# Patient Record
Sex: Female | Born: 1950 | Race: White | Hispanic: No | Marital: Married | Smoking: Never smoker
Health system: Southern US, Community
[De-identification: ages and names within clinical notes are randomized; demographics above are authoritative.]

## PROBLEM LIST (undated history)

## (undated) DIAGNOSIS — M8580 Other specified disorders of bone density and structure, unspecified site: Secondary | ICD-10-CM

## (undated) DIAGNOSIS — E2839 Other primary ovarian failure: Secondary | ICD-10-CM

## (undated) DIAGNOSIS — M25469 Effusion, unspecified knee: Secondary | ICD-10-CM

## (undated) DIAGNOSIS — M1990 Unspecified osteoarthritis, unspecified site: Secondary | ICD-10-CM

## (undated) DIAGNOSIS — R931 Abnormal findings on diagnostic imaging of heart and coronary circulation: Secondary | ICD-10-CM

## (undated) DIAGNOSIS — I77810 Thoracic aortic ectasia: Secondary | ICD-10-CM

## (undated) DIAGNOSIS — F329 Major depressive disorder, single episode, unspecified: Secondary | ICD-10-CM

## (undated) DIAGNOSIS — F988 Other specified behavioral and emotional disorders with onset usually occurring in childhood and adolescence: Secondary | ICD-10-CM

## (undated) DIAGNOSIS — J9811 Atelectasis: Secondary | ICD-10-CM

## (undated) DIAGNOSIS — Z9289 Personal history of other medical treatment: Secondary | ICD-10-CM

## (undated) DIAGNOSIS — F32A Depression, unspecified: Secondary | ICD-10-CM

## (undated) DIAGNOSIS — I10 Essential (primary) hypertension: Secondary | ICD-10-CM

## (undated) DIAGNOSIS — E785 Hyperlipidemia, unspecified: Secondary | ICD-10-CM

## (undated) HISTORY — DX: Unspecified osteoarthritis, unspecified site: M19.90

## (undated) HISTORY — DX: Atelectasis: J98.11

## (undated) HISTORY — DX: Abnormal findings on diagnostic imaging of heart and coronary circulation: R93.1

## (undated) HISTORY — DX: Hyperlipidemia, unspecified: E78.5

## (undated) HISTORY — PX: OTHER SURGICAL HISTORY: SHX169

## (undated) HISTORY — DX: Essential (primary) hypertension: I10

## (undated) HISTORY — PX: JOINT REPLACEMENT: SHX530

## (undated) HISTORY — DX: Other primary ovarian failure: E28.39

## (undated) HISTORY — PX: WISDOM TOOTH EXTRACTION: SHX21

## (undated) HISTORY — PX: DILATION AND CURETTAGE OF UTERUS: SHX78

## (undated) HISTORY — DX: Other specified disorders of bone density and structure, unspecified site: M85.80

## (undated) HISTORY — DX: Other specified behavioral and emotional disorders with onset usually occurring in childhood and adolescence: F98.8

## (undated) HISTORY — DX: Thoracic aortic ectasia: I77.810

---

## 1998-11-20 ENCOUNTER — Other Ambulatory Visit: Admission: RE | Admit: 1998-11-20 | Discharge: 1998-11-20 | Payer: Self-pay | Admitting: Family Medicine

## 2000-05-20 ENCOUNTER — Other Ambulatory Visit: Admission: RE | Admit: 2000-05-20 | Discharge: 2000-05-20 | Payer: Self-pay | Admitting: Family Medicine

## 2001-07-30 ENCOUNTER — Other Ambulatory Visit: Admission: RE | Admit: 2001-07-30 | Discharge: 2001-07-30 | Payer: Self-pay | Admitting: Family Medicine

## 2002-07-20 ENCOUNTER — Other Ambulatory Visit: Admission: RE | Admit: 2002-07-20 | Discharge: 2002-07-20 | Payer: Self-pay | Admitting: Family Medicine

## 2003-08-01 ENCOUNTER — Other Ambulatory Visit: Admission: RE | Admit: 2003-08-01 | Discharge: 2003-08-01 | Payer: Self-pay | Admitting: Family Medicine

## 2004-07-23 ENCOUNTER — Other Ambulatory Visit: Admission: RE | Admit: 2004-07-23 | Discharge: 2004-07-23 | Payer: Self-pay | Admitting: Family Medicine

## 2004-12-23 ENCOUNTER — Encounter: Admission: RE | Admit: 2004-12-23 | Discharge: 2004-12-23 | Payer: Self-pay | Admitting: Emergency Medicine

## 2005-08-08 ENCOUNTER — Other Ambulatory Visit: Admission: RE | Admit: 2005-08-08 | Discharge: 2005-08-08 | Payer: Self-pay | Admitting: Family Medicine

## 2006-10-27 ENCOUNTER — Other Ambulatory Visit: Admission: RE | Admit: 2006-10-27 | Discharge: 2006-10-27 | Payer: Self-pay | Admitting: Family Medicine

## 2007-09-09 HISTORY — PX: KNEE ARTHROSCOPY: SUR90

## 2007-12-23 ENCOUNTER — Encounter: Admission: RE | Admit: 2007-12-23 | Discharge: 2007-12-23 | Payer: Self-pay | Admitting: Family Medicine

## 2008-07-25 ENCOUNTER — Other Ambulatory Visit: Admission: RE | Admit: 2008-07-25 | Discharge: 2008-07-25 | Payer: Self-pay | Admitting: Family Medicine

## 2009-09-14 ENCOUNTER — Other Ambulatory Visit: Admission: RE | Admit: 2009-09-14 | Discharge: 2009-09-14 | Payer: Self-pay | Admitting: Family Medicine

## 2010-01-10 ENCOUNTER — Encounter: Admission: RE | Admit: 2010-01-10 | Discharge: 2010-01-10 | Payer: Self-pay | Admitting: Family Medicine

## 2014-05-26 DIAGNOSIS — R030 Elevated blood-pressure reading, without diagnosis of hypertension: Secondary | ICD-10-CM | POA: Diagnosis not present

## 2014-05-26 DIAGNOSIS — F3289 Other specified depressive episodes: Secondary | ICD-10-CM | POA: Diagnosis not present

## 2014-05-26 DIAGNOSIS — F988 Other specified behavioral and emotional disorders with onset usually occurring in childhood and adolescence: Secondary | ICD-10-CM | POA: Diagnosis not present

## 2014-05-26 DIAGNOSIS — Z23 Encounter for immunization: Secondary | ICD-10-CM | POA: Diagnosis not present

## 2014-05-26 DIAGNOSIS — M1990 Unspecified osteoarthritis, unspecified site: Secondary | ICD-10-CM | POA: Diagnosis not present

## 2014-05-26 DIAGNOSIS — F329 Major depressive disorder, single episode, unspecified: Secondary | ICD-10-CM | POA: Diagnosis not present

## 2014-05-26 DIAGNOSIS — E785 Hyperlipidemia, unspecified: Secondary | ICD-10-CM | POA: Diagnosis not present

## 2014-06-06 DIAGNOSIS — M25569 Pain in unspecified knee: Secondary | ICD-10-CM | POA: Diagnosis not present

## 2014-06-06 DIAGNOSIS — M1710 Unilateral primary osteoarthritis, unspecified knee: Secondary | ICD-10-CM | POA: Diagnosis not present

## 2014-06-27 ENCOUNTER — Other Ambulatory Visit (HOSPITAL_COMMUNITY): Payer: Self-pay | Admitting: Orthopaedic Surgery

## 2014-07-21 DIAGNOSIS — R399 Unspecified symptoms and signs involving the genitourinary system: Secondary | ICD-10-CM | POA: Diagnosis not present

## 2014-07-21 DIAGNOSIS — N390 Urinary tract infection, site not specified: Secondary | ICD-10-CM | POA: Diagnosis not present

## 2014-08-17 ENCOUNTER — Encounter (HOSPITAL_COMMUNITY)
Admission: RE | Admit: 2014-08-17 | Discharge: 2014-08-17 | Disposition: A | Discharge status: Home / Self Care | Payer: Medicare Other | Source: Ambulatory Visit | Attending: Orthopaedic Surgery | Admitting: Orthopaedic Surgery

## 2014-08-17 ENCOUNTER — Encounter (HOSPITAL_COMMUNITY): Payer: Self-pay

## 2014-08-17 DIAGNOSIS — M170 Bilateral primary osteoarthritis of knee: Secondary | ICD-10-CM | POA: Diagnosis not present

## 2014-08-17 DIAGNOSIS — Z01812 Encounter for preprocedural laboratory examination: Secondary | ICD-10-CM | POA: Insufficient documentation

## 2014-08-17 DIAGNOSIS — Z7901 Long term (current) use of anticoagulants: Secondary | ICD-10-CM | POA: Insufficient documentation

## 2014-08-17 HISTORY — DX: Major depressive disorder, single episode, unspecified: F32.9

## 2014-08-17 HISTORY — DX: Unspecified osteoarthritis, unspecified site: M19.90

## 2014-08-17 HISTORY — DX: Depression, unspecified: F32.A

## 2014-08-17 LAB — BASIC METABOLIC PANEL
Anion gap: 12 (ref 5–15)
BUN: 13 mg/dL (ref 6–23)
CO2: 32 mEq/L (ref 19–32)
Calcium: 10.1 mg/dL (ref 8.4–10.5)
Chloride: 96 mEq/L (ref 96–112)
Creatinine, Ser: 0.53 mg/dL (ref 0.50–1.10)
GFR calc Af Amer: >90 mL/min (ref >90)
GLUCOSE: 79 mg/dL (ref 70–99)
POTASSIUM: 3.7 meq/L (ref 3.7–5.3)
SODIUM: 140 meq/L (ref 137–147)

## 2014-08-17 LAB — CBC
HCT: 40 % (ref 36.0–46.0)
Hemoglobin: 13.1 g/dL (ref 12.0–15.0)
MCH: 29.9 pg (ref 26.0–34.0)
MCHC: 32.8 g/dL (ref 30.0–36.0)
MCV: 91.3 fL (ref 78.0–100.0)
PLATELETS: 276 10*3/uL (ref 150–400)
RBC: 4.38 MIL/uL (ref 3.87–5.11)
RDW: 13.7 % (ref 11.5–15.5)
WBC: 4.2 10*3/uL (ref 4.0–10.5)

## 2014-08-17 LAB — URINALYSIS, ROUTINE W REFLEX MICROSCOPIC
Glucose, UA: NEGATIVE mg/dL
Hgb urine dipstick: NEGATIVE
Ketones, ur: NEGATIVE mg/dL
LEUKOCYTES UA: NEGATIVE
NITRITE: NEGATIVE
PH: 8 (ref 5.0–8.0)
Protein, ur: NEGATIVE mg/dL
SPECIFIC GRAVITY, URINE: 1.011 (ref 1.005–1.030)
Urobilinogen, UA: 0.2 mg/dL (ref 0.0–1.0)

## 2014-08-17 LAB — PROTIME-INR
INR: 0.95 (ref 0.00–1.49)
PROTHROMBIN TIME: 12.8 s (ref 11.6–15.2)

## 2014-08-17 LAB — APTT: aPTT: 29 seconds (ref 24–37)

## 2014-08-17 LAB — SURGICAL PCR SCREEN
MRSA, PCR: NEGATIVE
STAPHYLOCOCCUS AUREUS: NEGATIVE

## 2014-08-17 NOTE — Patient Instructions (Addendum)
Becky JerichoRobin M Cunningham  08/17/2014   Your procedure is scheduled on: 08/25/2014    Come thru [facility] entrance.   and follow signs to               Short Stay Center at 0755 AM.  Call this number if you have problems the morning of surgery [phone]   Remember:  Do not eat food or drink liquids :After Midnight.     Take these medicines the morning of surgery with A SIP OF WATER: none                                You may not have any metal on your body including hair pins and              piercings  Do not wear jewelry, make-up, lotions, powders or perfumes.             Do not wear nail polish.  Do not shave  48 hours prior to surgery.     Do not bring valuables to the hospital. [facility] IS NOT             RESPONSIBLE   FOR VALUABLES.  Contacts, dentures or bridgework may not be worn into surgery.  Leave suitcase in the car. After surgery it may be brought to your room.                     Please read over the following fact sheets you were given: _____________________________________________________________________             [facility] Preparing [facility] Before surgery, you can play an important role.  Because skin is not sterile, your skin needs to be as free of germs as possible.  You can reduce the number of germs on your skin by washing with CHG (chlorahexidine gluconate) soap before surgery.  CHG is an antiseptic cleaner which kills germs and bonds with the skin to continue killing germs even after washing. Please DO NOT use if you have an allergy to CHG or antibacterial soaps.  If your skin becomes reddened/irritated stop using the CHG and inform your nurse when you arrive at Short Stay. Do not shave (including legs and underarms) for at least 48 hours prior to the first CHG shower.  You may shave your face/neck. Please follow these instructions carefully:  1.  Shower with CHG Soap the night before surgery and the  morning of Surgery.  2.   If you choose to wash your hair, wash your hair first as usual with your  normal  shampoo.  3.  After you shampoo, rinse your hair and body thoroughly to remove the  shampoo.                           4.  Use CHG as you would any other liquid soap.  You can apply chg directly  to the skin and wash                       Gently with a scrungie or clean washcloth.  5.  Apply the CHG Soap to your body ONLY FROM THE NECK DOWN.   Do not use on face/ open  Wound or open sores. Avoid contact with eyes, ears mouth and genitals (private parts).                       Wash face,  Genitals (private parts) with your normal soap.             6.  Wash thoroughly, paying special attention to the area where your surgery  will be performed.  7.  Thoroughly rinse your body with warm water from the neck down.  8.  DO NOT shower/wash with your normal soap after using and rinsing off  the CHG Soap.                9.  Pat yourself dry with a clean towel.            10.  Wear clean pajamas.            11.  Place clean sheets on your bed the night of your first shower and do not  sleep with pets. Day of Surgery : Do not apply any lotions/deodorants the morning of surgery.  Please wear clean clothes to the hospital/surgery center.  FAILURE TO FOLLOW THESE INSTRUCTIONS MAY RESULT IN THE CANCELLATION OF YOUR SURGERY PATIENT SIGNATURE_________________________________  NURSE SIGNATURE__________________________________  ________________________________________________________________________  WHAT IS A BLOOD TRANSFUSION? Blood Transfusion Information  A transfusion is the replacement of blood or some of its parts. Blood is made up of multiple cells which provide different functions.  Red blood cells carry oxygen and are used for blood loss replacement.  White blood cells fight against infection.  Platelets control bleeding.  Plasma helps clot blood.  Other blood products are available for  specialized needs, such as hemophilia or other clotting disorders. BEFORE THE TRANSFUSION  Who gives blood for transfusions?   Healthy volunteers who are fully evaluated to make sure their blood is safe. This is blood bank blood. Transfusion therapy is the safest it has ever been in the practice of medicine. Before blood is taken from a donor, a complete history is taken to make sure that person has no history of diseases nor engages in risky social behavior (examples are intravenous drug use or sexual activity with multiple partners). The donor's travel history is screened to minimize risk of transmitting infections, such as malaria. The donated blood is tested for signs of infectious diseases, such as HIV and hepatitis. The blood is then tested to be sure it is compatible with you in order to minimize the chance of a transfusion reaction. If you or a relative donates blood, this is often done in anticipation of surgery and is not appropriate for emergency situations. It takes many days to process the donated blood. RISKS AND COMPLICATIONS Although transfusion therapy is very safe and saves many lives, the main dangers of transfusion include:   Getting an infectious disease.  Developing a transfusion reaction. This is an allergic reaction to something in the blood you were given. Every precaution is taken to prevent this. The decision to have a blood transfusion has been considered carefully by your caregiver before blood is given. Blood is not given unless the benefits outweigh the risks. AFTER THE TRANSFUSION  Right after receiving a blood transfusion, you will usually feel much better and more energetic. This is especially true if your red blood cells have gotten low (anemic). The transfusion raises the level of the red blood cells which carry oxygen, and this usually causes an energy increase.  The  nurse administering the transfusion will monitor you carefully for complications. HOME CARE  INSTRUCTIONS  No special instructions are needed after a transfusion. You may find your energy is better. Speak with your caregiver about any limitations on activity for underlying diseases you may have. SEEK MEDICAL CARE IF:   Your condition is not improving after your transfusion.  You develop redness or irritation at the intravenous (IV) site. SEEK IMMEDIATE MEDICAL CARE IF:  Any of the following symptoms occur over the next 12 hours:  Shaking chills.  You have a temperature by mouth above 102 F (38.9 C), not controlled by medicine.  Chest, back, or muscle pain.  People around you feel you are not acting correctly or are confused.  Shortness of breath or difficulty breathing.  Dizziness and fainting.  You get a rash or develop hives.  You have a decrease in urine output.  Your urine turns a dark color or changes to pink, red, or brown. Any of the following symptoms occur over the next 10 days:  You have a temperature by mouth above 102 F (38.9 C), not controlled by medicine.  Shortness of breath.  Weakness after normal activity.  The white part of the eye turns yellow (jaundice).  You have a decrease in the amount of urine or are urinating less often.  Your urine turns a dark color or changes to pink, red, or brown. Document Released: 08/22/2000 Document Revised: 11/17/2011 Document Reviewed: 04/10/2008 [facility] Patient Information 2014 [place], [place].  _______________________________________________________________________

## 2014-08-25 ENCOUNTER — Inpatient Hospital Stay (HOSPITAL_COMMUNITY): Payer: Medicare Other | Admitting: Anesthesiology

## 2014-08-25 ENCOUNTER — Encounter (HOSPITAL_COMMUNITY)
Admission: RE | Disposition: A | Discharge status: Skilled Nursing Facility | Payer: Self-pay | Source: Ambulatory Visit | Attending: Orthopaedic Surgery

## 2014-08-25 ENCOUNTER — Inpatient Hospital Stay (HOSPITAL_COMMUNITY): Payer: Medicare Other

## 2014-08-25 ENCOUNTER — Inpatient Hospital Stay (HOSPITAL_COMMUNITY)
Admission: RE | Admit: 2014-08-25 | Discharge: 2014-08-29 | DRG: 462 | Disposition: A | Discharge status: Skilled Nursing Facility | Payer: Medicare Other | Source: Ambulatory Visit | Attending: Orthopaedic Surgery | Admitting: Orthopaedic Surgery

## 2014-08-25 ENCOUNTER — Encounter (HOSPITAL_COMMUNITY): Payer: Self-pay | Admitting: *Deleted

## 2014-08-25 DIAGNOSIS — M170 Bilateral primary osteoarthritis of knee: Secondary | ICD-10-CM | POA: Diagnosis not present

## 2014-08-25 DIAGNOSIS — G8918 Other acute postprocedural pain: Secondary | ICD-10-CM | POA: Diagnosis not present

## 2014-08-25 DIAGNOSIS — M25561 Pain in right knee: Secondary | ICD-10-CM | POA: Diagnosis not present

## 2014-08-25 DIAGNOSIS — M25462 Effusion, left knee: Secondary | ICD-10-CM | POA: Diagnosis present

## 2014-08-25 DIAGNOSIS — Z96653 Presence of artificial knee joint, bilateral: Secondary | ICD-10-CM

## 2014-08-25 DIAGNOSIS — M21061 Valgus deformity, not elsewhere classified, right knee: Secondary | ICD-10-CM | POA: Diagnosis present

## 2014-08-25 DIAGNOSIS — F339 Major depressive disorder, recurrent, unspecified: Secondary | ICD-10-CM | POA: Diagnosis not present

## 2014-08-25 DIAGNOSIS — Z471 Aftercare following joint replacement surgery: Secondary | ICD-10-CM | POA: Diagnosis not present

## 2014-08-25 DIAGNOSIS — Z91048 Other nonmedicinal substance allergy status: Secondary | ICD-10-CM

## 2014-08-25 DIAGNOSIS — F329 Major depressive disorder, single episode, unspecified: Secondary | ICD-10-CM | POA: Diagnosis present

## 2014-08-25 DIAGNOSIS — M179 Osteoarthritis of knee, unspecified: Secondary | ICD-10-CM | POA: Diagnosis not present

## 2014-08-25 DIAGNOSIS — D62 Acute posthemorrhagic anemia: Secondary | ICD-10-CM | POA: Diagnosis not present

## 2014-08-25 DIAGNOSIS — M25569 Pain in unspecified knee: Secondary | ICD-10-CM | POA: Diagnosis not present

## 2014-08-25 DIAGNOSIS — M25461 Effusion, right knee: Secondary | ICD-10-CM | POA: Diagnosis present

## 2014-08-25 DIAGNOSIS — M1990 Unspecified osteoarthritis, unspecified site: Secondary | ICD-10-CM | POA: Diagnosis not present

## 2014-08-25 DIAGNOSIS — M21062 Valgus deformity, not elsewhere classified, left knee: Secondary | ICD-10-CM | POA: Diagnosis present

## 2014-08-25 DIAGNOSIS — Z96651 Presence of right artificial knee joint: Secondary | ICD-10-CM | POA: Diagnosis not present

## 2014-08-25 DIAGNOSIS — Z96652 Presence of left artificial knee joint: Secondary | ICD-10-CM | POA: Diagnosis not present

## 2014-08-25 HISTORY — PX: TOTAL KNEE ARTHROPLASTY: SHX125

## 2014-08-25 LAB — TYPE AND SCREEN
ABO/RH(D): O POS
Antibody Screen: NEGATIVE

## 2014-08-25 LAB — ABO/RH: ABO/RH(D): O POS

## 2014-08-25 SURGERY — ARTHROPLASTY, KNEE, BILATERAL, TOTAL
Anesthesia: Epidural | Site: Knee | Laterality: Bilateral

## 2014-08-25 MED ORDER — METOCLOPRAMIDE HCL 5 MG/ML IJ SOLN
5.0000 mg | Freq: Three times a day (TID) | INTRAMUSCULAR | Status: DC | PRN
  Administered 2014-08-25: 10 mg via INTRAVENOUS

## 2014-08-25 MED ORDER — LACTATED RINGERS IV SOLN
INTRAVENOUS | Status: DC
  Administered 2014-08-25: 1000 mL via INTRAVENOUS

## 2014-08-25 MED ORDER — FENTANYL CITRATE 0.05 MG/ML IJ SOLN: 25.0000 ug | INTRAMUSCULAR | Status: DC | PRN

## 2014-08-25 MED ORDER — MENTHOL 3 MG MT LOZG
1.0000 | LOZENGE | OROMUCOSAL | Status: DC | PRN
  Filled 2014-08-25: qty 9

## 2014-08-25 MED ORDER — ACETAMINOPHEN 325 MG PO TABS
650.0000 mg | ORAL_TABLET | Freq: Four times a day (QID) | ORAL | Status: DC | PRN
  Administered 2014-08-27 – 2014-08-28 (×2): 650 mg via ORAL
  Filled 2014-08-25 (×2): qty 2

## 2014-08-25 MED ORDER — METHOCARBAMOL 1000 MG/10ML IJ SOLN
500.0000 mg | Freq: Four times a day (QID) | INTRAMUSCULAR | Status: DC | PRN
  Administered 2014-08-25: 500 mg via INTRAVENOUS
  Filled 2014-08-25 (×2): qty 5

## 2014-08-25 MED ORDER — 0.9 % SODIUM CHLORIDE (POUR BTL) OPTIME
TOPICAL | Status: DC | PRN
Start: 2014-08-25 — End: 2014-08-25
  Administered 2014-08-25: 1000 mL

## 2014-08-25 MED ORDER — PHENYLEPHRINE HCL 10 MG/ML IJ SOLN
10.0000 mg | INTRAVENOUS | Status: DC | PRN
  Administered 2014-08-25: 5 ug/min via INTRAVENOUS

## 2014-08-25 MED ORDER — METOCLOPRAMIDE HCL 10 MG PO TABS: 5.0000 mg | ORAL_TABLET | Freq: Three times a day (TID) | ORAL | Status: DC | PRN

## 2014-08-25 MED ORDER — POLYETHYLENE GLYCOL 3350 17 G PO PACK
17.0000 g | PACK | Freq: Every day | ORAL | Status: DC | PRN
  Administered 2014-08-27 – 2014-08-28 (×2): 17 g via ORAL
  Filled 2014-08-25 (×2): qty 1

## 2014-08-25 MED ORDER — ROPIVACAINE HCL 2 MG/ML IJ SOLN
12.0000 mL/h | INTRAMUSCULAR | Status: DC
  Administered 2014-08-25 – 2014-08-26 (×2): 12 mL/h via EPIDURAL
  Filled 2014-08-25 (×5): qty 200

## 2014-08-25 MED ORDER — VITAMIN C 500 MG PO TABS
1000.0000 mg | ORAL_TABLET | Freq: Every morning | ORAL | Status: DC
  Filled 2014-08-25: qty 2

## 2014-08-25 MED ORDER — AMPHETAMINE-DEXTROAMPHETAMINE 10 MG PO TABS
40.0000 mg | ORAL_TABLET | Freq: Every morning | ORAL | Status: DC
  Filled 2014-08-25 (×2): qty 4

## 2014-08-25 MED ORDER — ACETAMINOPHEN 650 MG RE SUPP: 650.0000 mg | Freq: Four times a day (QID) | RECTAL | Status: DC | PRN

## 2014-08-25 MED ORDER — PROPOFOL 10 MG/ML IV BOLUS
INTRAVENOUS | Status: AC
  Filled 2014-08-25: qty 20

## 2014-08-25 MED ORDER — ALUM & MAG HYDROXIDE-SIMETH 200-200-20 MG/5ML PO SUSP
30.0000 mL | ORAL | Status: DC | PRN
Start: 2014-08-25 — End: 2014-08-29

## 2014-08-25 MED ORDER — DIPHENHYDRAMINE HCL 12.5 MG/5ML PO ELIX: 12.5000 mg | ORAL_SOLUTION | ORAL | Status: DC | PRN

## 2014-08-25 MED ORDER — BUPIVACAINE HCL (PF) 0.5 % IJ SOLN
INTRAMUSCULAR | Status: DC | PRN
  Administered 2014-08-25 (×2): 5 mL
  Administered 2014-08-25: 12 mL via EPIDURAL
  Administered 2014-08-25 (×2): 5 mL

## 2014-08-25 MED ORDER — FENTANYL CITRATE 0.05 MG/ML IJ SOLN
INTRAMUSCULAR | Status: AC
  Filled 2014-08-25: qty 2

## 2014-08-25 MED ORDER — FENTANYL CITRATE 0.05 MG/ML IJ SOLN
50.0000 ug | INTRAMUSCULAR | Status: DC | PRN
  Administered 2014-08-25: 100 ug via INTRAVENOUS

## 2014-08-25 MED ORDER — CEFAZOLIN SODIUM-DEXTROSE 2-3 GM-% IV SOLR
INTRAVENOUS | Status: AC
  Filled 2014-08-25: qty 50

## 2014-08-25 MED ORDER — FLUOXETINE HCL 20 MG PO CAPS
60.0000 mg | ORAL_CAPSULE | Freq: Every day | ORAL | Status: DC
  Administered 2014-08-26 – 2014-08-29 (×4): 60 mg via ORAL
  Filled 2014-08-25 (×4): qty 3

## 2014-08-25 MED ORDER — LACTATED RINGERS IV SOLN
INTRAVENOUS | Status: DC | PRN
  Administered 2014-08-25 (×4): via INTRAVENOUS

## 2014-08-25 MED ORDER — PROPOFOL INFUSION 10 MG/ML OPTIME
INTRAVENOUS | Status: DC | PRN
  Administered 2014-08-25: 150 ug/kg/min via INTRAVENOUS

## 2014-08-25 MED ORDER — DEXAMETHASONE SODIUM PHOSPHATE 10 MG/ML IJ SOLN
INTRAMUSCULAR | Status: AC
  Filled 2014-08-25: qty 1

## 2014-08-25 MED ORDER — METHOCARBAMOL 500 MG PO TABS
500.0000 mg | ORAL_TABLET | Freq: Four times a day (QID) | ORAL | Status: DC | PRN
  Administered 2014-08-25 – 2014-08-29 (×7): 500 mg via ORAL
  Filled 2014-08-25 (×8): qty 1

## 2014-08-25 MED ORDER — TRANEXAMIC ACID 100 MG/ML IV SOLN
1000.0000 mg | INTRAVENOUS | Status: AC
  Administered 2014-08-25: 1000 mg via INTRAVENOUS
  Filled 2014-08-25: qty 10

## 2014-08-25 MED ORDER — ONDANSETRON HCL 4 MG/2ML IJ SOLN
4.0000 mg | Freq: Four times a day (QID) | INTRAMUSCULAR | Status: DC | PRN
  Administered 2014-08-25 – 2014-08-26 (×2): 4 mg via INTRAVENOUS
  Filled 2014-08-25 (×2): qty 2

## 2014-08-25 MED ORDER — MIDAZOLAM HCL 2 MG/2ML IJ SOLN
1.0000 mg | INTRAMUSCULAR | Status: DC | PRN
  Administered 2014-08-25: 2 mg via INTRAVENOUS
  Filled 2014-08-25: qty 2

## 2014-08-25 MED ORDER — BUPIVACAINE HCL (PF) 0.5 % IJ SOLN
INTRAMUSCULAR | Status: AC
  Filled 2014-08-25: qty 30

## 2014-08-25 MED ORDER — SODIUM CHLORIDE 0.9 % IV SOLN
INTRAVENOUS | Status: DC
  Administered 2014-08-27 – 2014-08-28 (×2): via INTRAVENOUS

## 2014-08-25 MED ORDER — PROMETHAZINE HCL 25 MG/ML IJ SOLN: 6.2500 mg | INTRAMUSCULAR | Status: DC | PRN

## 2014-08-25 MED ORDER — CEFAZOLIN SODIUM 1-5 GM-% IV SOLN
1.0000 g | Freq: Four times a day (QID) | INTRAVENOUS | Status: AC
  Administered 2014-08-25 (×2): 1 g via INTRAVENOUS
  Filled 2014-08-25 (×2): qty 50

## 2014-08-25 MED ORDER — ZOLPIDEM TARTRATE 5 MG PO TABS: 5.0000 mg | ORAL_TABLET | Freq: Every evening | ORAL | Status: DC | PRN

## 2014-08-25 MED ORDER — LACTATED RINGERS IV SOLN: INTRAVENOUS | Status: DC

## 2014-08-25 MED ORDER — MEPERIDINE HCL 50 MG/ML IJ SOLN: 6.2500 mg | INTRAMUSCULAR | Status: DC | PRN

## 2014-08-25 MED ORDER — PHENOL 1.4 % MT LIQD
1.0000 | OROMUCOSAL | Status: DC | PRN
  Filled 2014-08-25: qty 177

## 2014-08-25 MED ORDER — MIDAZOLAM HCL 2 MG/2ML IJ SOLN
INTRAMUSCULAR | Status: AC
  Filled 2014-08-25: qty 2

## 2014-08-25 MED ORDER — CEFAZOLIN SODIUM-DEXTROSE 2-3 GM-% IV SOLR
2.0000 g | INTRAVENOUS | Status: AC
  Administered 2014-08-25: 2 g via INTRAVENOUS

## 2014-08-25 MED ORDER — ONDANSETRON HCL 4 MG PO TABS
4.0000 mg | ORAL_TABLET | Freq: Four times a day (QID) | ORAL | Status: DC | PRN
  Administered 2014-08-26: 4 mg via ORAL
  Filled 2014-08-25: qty 1

## 2014-08-25 MED ORDER — HYDROMORPHONE HCL 1 MG/ML IJ SOLN
1.0000 mg | INTRAMUSCULAR | Status: DC | PRN
  Administered 2014-08-26 – 2014-08-27 (×12): 1 mg via INTRAVENOUS
  Filled 2014-08-25 (×14): qty 1

## 2014-08-25 MED ORDER — OXYCODONE HCL 5 MG PO TABS
5.0000 mg | ORAL_TABLET | ORAL | Status: DC | PRN
  Administered 2014-08-25: 5 mg via ORAL
  Filled 2014-08-25 (×2): qty 1

## 2014-08-25 MED ORDER — SODIUM CHLORIDE 0.9 % IR SOLN
Status: DC | PRN
  Administered 2014-08-25: 3000 mL

## 2014-08-25 MED ORDER — ACETAMINOPHEN 10 MG/ML IV SOLN
1000.0000 mg | Freq: Once | INTRAVENOUS | Status: AC
  Administered 2014-08-25: 1000 mg via INTRAVENOUS
  Filled 2014-08-25: qty 100

## 2014-08-25 MED ORDER — DOCUSATE SODIUM 100 MG PO CAPS
100.0000 mg | ORAL_CAPSULE | Freq: Two times a day (BID) | ORAL | Status: DC
  Administered 2014-08-25 – 2014-08-29 (×6): 100 mg via ORAL

## 2014-08-25 SURGICAL SUPPLY — 67 items
BAG ZIPLOCK 12X15 (MISCELLANEOUS) IMPLANT
BANDAGE ELASTIC 6 VELCRO ST LF (GAUZE/BANDAGES/DRESSINGS) ×4 IMPLANT
BANDAGE ESMARK 6X9 LF (GAUZE/BANDAGES/DRESSINGS) ×2 IMPLANT
BEARIN INSERT TIBIAL 11 (Orthopedic Implant) ×2 IMPLANT
BEARING INSERT TIBIAL 11 (Orthopedic Implant) ×1 IMPLANT
BENZOIN TINCTURE PRP APPL 2/3 (GAUZE/BANDAGES/DRESSINGS) ×4 IMPLANT
BLADE HEX COATED 2.75 (ELECTRODE) ×2 IMPLANT
BLADE SAG 18X100X1.27 (BLADE) ×4 IMPLANT
BLADE SURG SZ10 CARB STEEL (BLADE) ×4 IMPLANT
BNDG COHESIVE 6X5 TAN STRL LF (GAUZE/BANDAGES/DRESSINGS) ×2 IMPLANT
BNDG ESMARK 6X9 LF (GAUZE/BANDAGES/DRESSINGS) ×4
BOWL SMART MIX CTS (DISPOSABLE) ×4 IMPLANT
CAPT KNEE TOTAL 3 ×4 IMPLANT
CEMENT BONE 1-PACK (Cement) ×8 IMPLANT
CUFF TOURN SGL QUICK 34 (TOURNIQUET CUFF) ×2
CUFF TRNQT CYL 34X4X40X1 (TOURNIQUET CUFF) ×2 IMPLANT
DRAPE EXTREMITY BILATERAL (DRAPE) ×2 IMPLANT
DRAPE INCISE IOBAN 66X45 STRL (DRAPES) ×2 IMPLANT
DRAPE POUCH INSTRU U-SHP 10X18 (DRAPES) ×2 IMPLANT
DRAPE U-SHAPE 47X51 STRL (DRAPES) ×4 IMPLANT
DRSG ADAPTIC 3X8 NADH LF (GAUZE/BANDAGES/DRESSINGS) ×2 IMPLANT
DRSG AQUACEL AG ADV 3.5X10 (GAUZE/BANDAGES/DRESSINGS) IMPLANT
DRSG PAD ABDOMINAL 8X10 ST (GAUZE/BANDAGES/DRESSINGS) ×2 IMPLANT
DRSG TEGADERM 4X4.75 (GAUZE/BANDAGES/DRESSINGS) IMPLANT
DURAPREP 26ML APPLICATOR (WOUND CARE) ×4 IMPLANT
ELECT REM PT RETURN 9FT ADLT (ELECTROSURGICAL) ×2
ELECTRODE REM PT RTRN 9FT ADLT (ELECTROSURGICAL) ×1 IMPLANT
EVACUATOR 1/8 PVC DRAIN (DRAIN) IMPLANT
FACESHIELD WRAPAROUND (MASK) ×12 IMPLANT
FEMORAL TRIATH POST STAB  SZ3 (Orthopedic Implant) ×1 IMPLANT
FEMORAL TRIATH POST STAB SZ3 (Orthopedic Implant) ×1 IMPLANT
GAUZE SPONGE 2X2 8PLY STRL LF (GAUZE/BANDAGES/DRESSINGS) IMPLANT
GAUZE SPONGE 4X4 12PLY STRL (GAUZE/BANDAGES/DRESSINGS) ×4 IMPLANT
GAUZE XEROFORM 5X9 LF (GAUZE/BANDAGES/DRESSINGS) IMPLANT
GLOVE BIO SURGEON STRL SZ7.5 (GLOVE) ×4 IMPLANT
GLOVE BIOGEL PI IND STRL 8 (GLOVE) ×2 IMPLANT
GLOVE BIOGEL PI INDICATOR 8 (GLOVE) ×2
GLOVE ECLIPSE 8.0 STRL XLNG CF (GLOVE) ×4 IMPLANT
GOWN STRL REUS W/TWL XL LVL3 (GOWN DISPOSABLE) ×4 IMPLANT
HANDPIECE INTERPULSE COAX TIP (DISPOSABLE) ×1
IMMOBILIZER KNEE 20 (SOFTGOODS) ×4 IMPLANT
IMMOBILIZER KNEE 20 THIGH 36 (SOFTGOODS) ×1 IMPLANT
KIT BASIN OR (CUSTOM PROCEDURE TRAY) ×2 IMPLANT
MANIFOLD NEPTUNE II (INSTRUMENTS) ×2 IMPLANT
PACK TOTAL JOINT (CUSTOM PROCEDURE TRAY) ×2 IMPLANT
PAD ABD 8X10 STRL (GAUZE/BANDAGES/DRESSINGS) ×4 IMPLANT
PADDING CAST COTTON 6X4 STRL (CAST SUPPLIES) ×4 IMPLANT
SET HNDPC FAN SPRY TIP SCT (DISPOSABLE) ×1 IMPLANT
SET PAD KNEE POSITIONER (MISCELLANEOUS) ×4 IMPLANT
SPONGE GAUZE 2X2 STER 10/PKG (GAUZE/BANDAGES/DRESSINGS)
SPONGE LAP 18X18 X RAY DECT (DISPOSABLE) ×4 IMPLANT
STAPLER VISISTAT 35W (STAPLE) IMPLANT
STOCKINETTE 8 INCH (MISCELLANEOUS) ×2 IMPLANT
STRIP CLOSURE SKIN 1/2X4 (GAUZE/BANDAGES/DRESSINGS) ×4 IMPLANT
SUCTION FRAZIER 12FR DISP (SUCTIONS) ×2 IMPLANT
SUT MNCRL AB 4-0 PS2 18 (SUTURE) ×4 IMPLANT
SUT VIC AB 0 CT1 27 (SUTURE) ×4
SUT VIC AB 0 CT1 27XBRD ANTBC (SUTURE) ×4 IMPLANT
SUT VIC AB 1 CT1 27 (SUTURE) ×4
SUT VIC AB 1 CT1 27XBRD ANTBC (SUTURE) ×4 IMPLANT
SUT VIC AB 2-0 CT1 27 (SUTURE) ×6
SUT VIC AB 2-0 CT1 TAPERPNT 27 (SUTURE) ×6 IMPLANT
SUT VIC AB 4-0 PS2 27 (SUTURE) IMPLANT
TOWEL OR 17X26 10 PK STRL BLUE (TOWEL DISPOSABLE) ×2 IMPLANT
TRAY FOLEY CATH 14FRSI W/METER (CATHETERS) ×2 IMPLANT
WATER STERILE IRR 1500ML POUR (IV SOLUTION) ×2 IMPLANT
WRAP KNEE MAXI GEL POST OP (GAUZE/BANDAGES/DRESSINGS) ×4 IMPLANT

## 2014-08-25 NOTE — Brief Op Note (Signed)
08/25/2014  12:49 PM  PATIENT:  Becky Cunningham  63 y.o. female  PRE-OPERATIVE DIAGNOSIS:  Bilateral knee osteoarthritis  POST-OPERATIVE DIAGNOSIS:  Bilateral knee osteoarthritis  PROCEDURE:  Procedure(s): TOTAL KNEE BILATERAL (Bilateral)  SURGEON:  Surgeon(s) and Role:    * Kathryne Hitchhristopher Y Tomie Spizzirri, MD - Primary  PHYSICIAN ASSISTANT: Rexene EdisonGil Clark, PA-C  ANESTHESIA:   epidural and spinal  EBL:  Total I/O In: 2000 [I.V.:2000] Out: 1450 [Urine:1400; Blood:50]  BLOOD ADMINISTERED:none  DRAINS: none   LOCAL MEDICATIONS USED:  NONE  SPECIMEN:  No Specimen  DISPOSITION OF SPECIMEN:  N/A  COUNTS:  YES  TOURNIQUET:   Total Tourniquet Time Documented: Thigh (Left) - 60 minutes Total: Thigh (Left) - 60 minutes  Thigh (Right) - 47 minutes Total: Thigh (Right) - 47 minutes   DICTATION: .Other Dictation: Dictation Number [id number]  PLAN OF CARE: Admit to inpatient   PATIENT DISPOSITION:  PACU - hemodynamically stable.   Delay start of Pharmacological VTE agent (>24hrs) due to surgical blood loss or risk of bleeding: no

## 2014-08-25 NOTE — Progress Notes (Signed)
Utilization review completed.  

## 2014-08-25 NOTE — Anesthesia Preprocedure Evaluation (Addendum)
Anesthesia Evaluation  Patient identified by MRN, date of birth, ID band Patient awake    Reviewed: Allergy & Precautions, H&P , NPO status , Patient's Chart, lab work & pertinent test results  Airway Mallampati: II  TM Distance: >3 FB Neck ROM: Full    Dental no notable dental hx.    Pulmonary neg pulmonary ROS,  breath sounds clear to auscultation  Pulmonary exam normal       Cardiovascular negative cardio ROS  Rhythm:Regular Rate:Normal     Neuro/Psych negative neurological ROS  negative psych ROS   GI/Hepatic negative GI ROS, Neg liver ROS,   Endo/Other  negative endocrine ROS  Renal/GU negative Renal ROS  negative genitourinary   Musculoskeletal negative musculoskeletal ROS (+)   Abdominal   Peds negative pediatric ROS (+)  Hematology negative hematology ROS (+)   Anesthesia Other Findings   Reproductive/Obstetrics negative OB ROS                             Anesthesia Physical Anesthesia Plan  ASA: I  Anesthesia Plan: Epidural   Post-op Pain Management:    Induction: Intravenous  Airway Management Planned: Simple Face Mask  Additional Equipment:   Intra-op Plan:   Post-operative Plan:   Informed Consent: I have reviewed the patients History and Physical, chart, labs and discussed the procedure including the risks, benefits and alternatives for the proposed anesthesia with the patient or authorized representative who has indicated his/her understanding and acceptance.   Dental advisory given  Plan Discussed with: CRNA  Anesthesia Plan Comments:         Anesthesia Quick Evaluation

## 2014-08-25 NOTE — Anesthesia Procedure Notes (Signed)
Epidural Patient location during procedure: holding area  Staffing Anesthesiologist: Phillips GroutARIGNAN, Romolo Sieling Performed by: anesthesiologist   Preanesthetic Checklist Completed: patient identified, site marked, surgical consent, pre-op evaluation, timeout performed, IV checked, risks and benefits discussed, monitors and equipment checked and post-op pain management  Epidural Patient position: sitting Prep: Betadine Patient monitoring: heart rate, continuous pulse ox and blood pressure Approach: right paramedian Location: L4-L5 Injection technique: LOR saline  Needle:  Needle type: Hustead  Needle gauge: 18 G Needle length: 9 cm and 9 Needle insertion depth: 6 cm Catheter type: closed end flexible Catheter size: 20 Guage Catheter at skin depth: 10 cm Test dose: negative and 1.5% lidocaine  Assessment Events: blood not aspirated, injection not painful, no injection resistance, negative IV test and no paresthesia  Additional Notes Dosed with 12cc of 0.5% bupiv through the needle.  Patient tolerated the insertion well without complications.Reason for block:post-op pain management

## 2014-08-25 NOTE — Transfer of Care (Signed)
Immediate Anesthesia Transfer of Care Note  Patient: Becky JerichoRobin M Plate  Procedure(s) Performed: Procedure(s): TOTAL KNEE BILATERAL (Bilateral)  Patient Location: PACU  Anesthesia Type:Epidural  Level of Consciousness: awake, alert , oriented and patient cooperative  Airway & Oxygen Therapy: Patient Spontanous Breathing and Patient connected to face mask oxygen  Post-op Assessment: Report given to PACU RN and Post -op Vital signs reviewed and stable  Post vital signs: stable  Complications: No apparent anesthesia complications  S1 level epidural level

## 2014-08-25 NOTE — H&P (Signed)
TOTAL KNEE ADMISSION H&P  Patient is being admitted for bilaterally total knee arthroplasty.  Subjective:  Chief Complaint:bilaterally knee pain.  HPI: Becky Cunningham, 63 y.o. female, has a history of pain and functional disability in the bilaterally knee due to primary osteoarthritis and has failed non-surgical conservative treatments for greater than 12 weeks to includeNSAID's and/or analgesics, corticosteriod injections, viscosupplementation injections, flexibility and strengthening excercises and activity modification.  Onset of symptoms was gradual, starting 7 years ago with gradually worsening course since that time. The patient noted prior procedures on the knee to include  arthroscopy on the left knee(s).  Patient currently rates pain in the bilaterally knee(s) at 9 out of 10 with activity. Patient has night pain, worsening of pain with activity and weight bearing, pain that interferes with activities of daily living, pain with passive range of motion, crepitus and joint swelling.  Patient has evidence of subchondral sclerosis, periarticular osteophytes and joint space narrowing by imaging studies. There is no active infection.  Patient Active Problem List   Diagnosis Date Noted  . Primary osteoarthritis of both knees 08/25/2014   Past Medical History  Diagnosis Date  . Depression   . Arthritis     Past Surgical History  Procedure Laterality Date  . Dilation and curettage of uterus    . Knee arthroscopy  2009    left   . Wisdom tooth extraction    . Tmj similar surgery       No prescriptions prior to admission   Allergies  Allergen Reactions  . Other Other (See Comments)    Sugar enzymes--Throat swelling/scratchy. (when eats something really sweet)    History  Substance Use Topics  . Smoking status: Never Smoker   . Smokeless tobacco: Never Used  . Alcohol Use: 8.4 oz/week    14 Glasses of wine per week    No family history on file.   Review of Systems   Musculoskeletal: Positive for joint pain.  All other systems reviewed and are negative.   Objective:  Physical Exam  Constitutional: She is oriented to person, place, and time. She appears well-developed and well-nourished.  HENT:  Head: Normocephalic and atraumatic.  Eyes: EOM are normal. Pupils are equal, round, and reactive to light.  Neck: Normal range of motion. Neck supple.  Cardiovascular: Normal rate and regular rhythm.   Respiratory: Effort normal and breath sounds normal.  GI: Soft. Bowel sounds are normal.  Musculoskeletal:       Right knee: She exhibits decreased range of motion, swelling, effusion and deformity. Tenderness found. Lateral joint line tenderness noted.       Left knee: She exhibits decreased range of motion, swelling, effusion and abnormal alignment. Tenderness found. Lateral joint line tenderness noted.  Neurological: She is alert and oriented to person, place, and time.  Skin: Skin is warm and dry.  Psychiatric: She has a normal mood and affect.    Vital signs in last 24 hours:    Labs:   There is no height or weight on file to calculate BMI.   Imaging Review Plain radiographs demonstrate severe degenerative joint disease of the bilaterally knee(s). The overall alignment ismild valgus. The bone quality appears to be good for age and reported activity level.  Assessment/Plan:  End stage arthritis, bilaterally knee   The patient history, physical examination, clinical judgment of the provider and imaging studies are consistent with end stage degenerative joint disease of the bilaterally knee(s) and total knee arthroplasty is deemed medically necessary.  The treatment options including medical management, injection therapy arthroscopy and arthroplasty were discussed at length. The risks and benefits of total knee arthroplasty were presented and reviewed. The risks due to aseptic loosening, infection, stiffness, patella tracking problems,  thromboembolic complications and other imponderables were discussed. The patient acknowledged the explanation, agreed to proceed with the plan and consent was signed. Patient is being admitted for inpatient treatment for surgery, pain control, PT, OT, prophylactic antibiotics, VTE prophylaxis, progressive ambulation and ADL's and discharge planning. The patient is planning to be discharged home with home health services

## 2014-08-25 NOTE — Anesthesia Postprocedure Evaluation (Signed)
  Anesthesia Post-op Note  Patient: Becky JerichoRobin M Okubo  Procedure(s) Performed: Procedure(s) (LRB): TOTAL KNEE BILATERAL (Bilateral)  Patient Location: PACU  Anesthesia Type: Epidural  Level of Consciousness: awake and alert   Airway and Oxygen Therapy: Patient Spontanous Breathing  Post-op Pain: mild  Post-op Assessment: Post-op Vital signs reviewed, Patient's Cardiovascular Status Stable, Respiratory Function Stable, Patent Airway and No signs of Nausea or vomiting  Last Vitals:  Filed Vitals:   08/25/14 1415  BP: 138/83  Pulse: 56  Temp:   Resp: 10    Post-op Vital Signs: stable   Complications: No apparent anesthesia complications

## 2014-08-25 NOTE — Plan of Care (Signed)
Problem: Consults Goal: Diagnosis- Total Joint Replacement Primary Total Knee     

## 2014-08-26 LAB — BASIC METABOLIC PANEL
Anion gap: 11 (ref 5–15)
BUN: 8 mg/dL (ref 6–23)
CO2: 28 mEq/L (ref 19–32)
CREATININE: 0.54 mg/dL (ref 0.50–1.10)
Calcium: 8.7 mg/dL (ref 8.4–10.5)
Chloride: 102 mEq/L (ref 96–112)
Glucose, Bld: 118 mg/dL — ABNORMAL HIGH (ref 70–99)
Potassium: 3.6 mEq/L — ABNORMAL LOW (ref 3.7–5.3)
Sodium: 141 mEq/L (ref 137–147)

## 2014-08-26 LAB — CBC
HCT: 34 % — ABNORMAL LOW (ref 36.0–46.0)
Hemoglobin: 11.2 g/dL — ABNORMAL LOW (ref 12.0–15.0)
MCH: 30.4 pg (ref 26.0–34.0)
MCHC: 32.9 g/dL (ref 30.0–36.0)
MCV: 92.1 fL (ref 78.0–100.0)
PLATELETS: 217 10*3/uL (ref 150–400)
RBC: 3.69 MIL/uL — ABNORMAL LOW (ref 3.87–5.11)
RDW: 13.8 % (ref 11.5–15.5)
WBC: 6.2 10*3/uL (ref 4.0–10.5)

## 2014-08-26 MED ORDER — OXYCODONE HCL 5 MG PO TABS
5.0000 mg | ORAL_TABLET | ORAL | Status: DC | PRN
  Administered 2014-08-26 – 2014-08-27 (×2): 10 mg via ORAL
  Administered 2014-08-27 (×2): 15 mg via ORAL
  Administered 2014-08-27: 5 mg via ORAL
  Administered 2014-08-27 – 2014-08-28 (×7): 15 mg via ORAL
  Administered 2014-08-29: 10 mg via ORAL
  Administered 2014-08-29 (×3): 15 mg via ORAL
  Filled 2014-08-26 (×3): qty 3
  Filled 2014-08-26: qty 1
  Filled 2014-08-26 (×3): qty 3
  Filled 2014-08-26 (×2): qty 2
  Filled 2014-08-26 (×7): qty 3

## 2014-08-26 MED ORDER — ROPIVACAINE HCL 2 MG/ML IJ SOLN
12.0000 mL/h | INTRAMUSCULAR | Status: DC
  Administered 2014-08-26: 12 mL/h via EPIDURAL
  Filled 2014-08-26: qty 200

## 2014-08-26 MED ORDER — VITAMIN C 500 MG PO TABS
1000.0000 mg | ORAL_TABLET | Freq: Every morning | ORAL | Status: DC
  Administered 2014-08-29: 1000 mg via ORAL

## 2014-08-26 NOTE — Progress Notes (Signed)
OT Cancellation Note  Patient Details Name: Ricardo JerichoRobin M Staub MRN: [id number] DOB: Apr 21, 1951   Cancelled Treatment:    Reason Eval/Treat Not Completed: Other (comment) Spoke with PT and pt was orthostatic during PT eval. Only attempted EOB but had to lie pt back down. Will try pt back next date.  Lennox LaityStone, Marquisa Salih Stafford  [phone] 08/26/2014, 2:56 PM

## 2014-08-26 NOTE — Progress Notes (Signed)
Epidural Follow Up POD 1:   Examined and evaluated Becky Cunningham. She reports that her epidural was working well over night but she is slightly more numb on the left vs right. She did receive a PRN dilaudid dose early this am which improved her discomfort. She discussed using this again prior to PT. Denies any HA, dizzy, blurred vision. Motor function in bilateral lower extremities. Epidural site c/d/i w/ no erythema, edema, or drainage. Will reevaluate daily and please contact anesthesia when primary service would like epidural removed.

## 2014-08-26 NOTE — Addendum Note (Signed)
Addendum  created 08/26/14 0839 by Felipe DroneMary Jennette Parrie Rasco, MD   Modules edited: Clinical Notes   Clinical Notes:  File: [id number]

## 2014-08-26 NOTE — Plan of Care (Signed)
Problem: Consults Goal: Diagnosis- Total Joint Replacement Outcome: Completed/Met Date Met:  08/26/14 Primary Total Knee BILATERAL

## 2014-08-26 NOTE — Plan of Care (Signed)
Problem: Phase I Progression Outcomes Goal: Dangle or out of bed evening of surgery Outcome: Completed/Met Date Met:  08/26/14 Pod #1

## 2014-08-26 NOTE — Progress Notes (Signed)
Physical Therapy Treatment Patient Details Name: Ricardo JerichoRobin M Pesci MRN: [id number] DOB: August 12, 1951 Today's Date: 08/26/2014    History of Present Illness Bil TKR    PT Comments    Progression from am with decreased c/o dizziness with OOB.  Follow Up Recommendations  SNF     Equipment Recommendations  Rolling walker with 5" wheels    Recommendations for Other Services OT consult     Precautions / Restrictions Precautions Precautions: Knee;Fall Required Braces or Orthoses: Knee Immobilizer - Right;Knee Immobilizer - Left Knee Immobilizer - Right: Discontinue once straight leg raise with < 10 degree lag Knee Immobilizer - Left: Discontinue once straight leg raise with < 10 degree lag Restrictions Weight Bearing Restrictions: No Other Position/Activity Restrictions: WBAT    Mobility  Bed Mobility Overal bed mobility: +2 for physical assistance;+ 2 for safety/equipment;Needs Assistance Bed Mobility: Supine to Sit;Sit to Supine     Supine to sit: Mod assist;+2 for physical assistance;+2 for safety/equipment Sit to supine: Mod assist;+2 for physical assistance;+2 for safety/equipment   General bed mobility comments: cues for sequence, Assist for Bil LEs and to control trunk up/down  Transfers Overall transfer level: Needs assistance Equipment used: Rolling walker (2 wheeled) Transfers: Sit to/from Stand Sit to Stand: Mod assist;+2 physical assistance;+2 safety/equipment;From elevated surface         General transfer comment: cues for transition position and use of UEs to self assist  Ambulation/Gait Ambulation/Gait assistance: Mod assist;+2 physical assistance;+2 safety/equipment Ambulation Distance (Feet): 3 Feet Assistive device: Rolling walker (2 wheeled) Gait Pattern/deviations: Step-to pattern;Decreased step length - right;Decreased step length - left;Shuffle;Trunk flexed Gait velocity: decr       Stairs            Wheelchair Mobility    Modified  Rankin (Stroke Patients Only)       Balance                                    Cognition Arousal/Alertness: Awake/alert Behavior During Therapy: WFL for tasks assessed/performed Overall Cognitive Status: Within Functional Limits for tasks assessed                      Exercises      General Comments        Pertinent Vitals/Pain Pain Assessment: 0-10 Pain Score: 5  Pain Location: Bil knees Pain Descriptors / Indicators: Aching;Sore Pain Intervention(s): Limited activity within patient's tolerance;Monitored during session;Premedicated before session;Ice applied    Home Living                      Prior Function            PT Goals (current goals can now be found in the care plan section) Acute Rehab PT Goals Patient Stated Goal: Resume previous lifestyle with decreased pain PT Goal Formulation: With patient Time For Goal Achievement: 09/02/14 Potential to Achieve Goals: Good Progress towards PT goals: Progressing toward goals    Frequency  7X/week    PT Plan Current plan remains appropriate    Co-evaluation             End of Session Equipment Utilized During Treatment: Right knee immobilizer;Left knee immobilizer;Gait belt Activity Tolerance: Other (comment) (dizziness with OOB - decreased from this am) Patient left: in bed;with call bell/phone within reach     Time: 1440-1513 PT Time Calculation (min) (ACUTE ONLY): 33 min  Charges:  $Gait Training: 8-22 mins                    G Codes:      Angellee Cohill 08/26/2014, 5:06 PM

## 2014-08-26 NOTE — Evaluation (Signed)
Physical Therapy Evaluation Patient Details Name: Becky JerichoRobin M Cunningham MRN: [id number] DOB: [birth year] Today's Date: 08/26/2014   History of Present Illness  Bil TKR  Clinical Impression  Pt s/p Bil TKR presents with decreased Bil LE strength/ROM, post op pain and orthostatic hypotension on eval limiting functional mobility.  Pt plans d.c SNF for rehab and has expressed [facility] in [facility] and [facility].    Follow Up Recommendations SNF    Equipment Recommendations  Rolling walker with 5" wheels    Recommendations for Other Services OT consult     Precautions / Restrictions Precautions Precautions: Knee;Fall Required Braces or Orthoses: Knee Immobilizer - Right;Knee Immobilizer - Left Knee Immobilizer - Right: Discontinue once straight leg raise with < 10 degree lag Knee Immobilizer - Left: Discontinue once straight leg raise with < 10 degree lag Restrictions Weight Bearing Restrictions: No Other Position/Activity Restrictions: WBAT      Mobility  Bed Mobility Overal bed mobility: +2 for physical assistance;+ 2 for safety/equipment             General bed mobility comments: cues for sequence, Assist for Bil LEs and to control trunk up/down  Transfers                 General transfer comment: NT - pt orthostatic  Ambulation/Gait                Stairs            Wheelchair Mobility    Modified Rankin (Stroke Patients Only)       Balance                                             Pertinent Vitals/Pain Pain Assessment: 0-10 Pain Score: 5  Pain Location: R knee - min pain L knee Pain Descriptors / Indicators: Aching;Sore Pain Intervention(s): Limited activity within patient's tolerance;Monitored during session;Premedicated before session;Ice applied;Other (comment) (epidural in place)    Home Living Family/patient expects to be discharged to:: Skilled nursing facility                 Additional Comments: Pt  is interested in [facility] and [facility]    Prior Function Level of Independence: Independent               Hand Dominance        Extremity/Trunk Assessment   Upper Extremity Assessment: Overall WFL for tasks assessed           Lower Extremity Assessment: RLE deficits/detail;LLE deficits/detail RLE Deficits / Details: 2+/5 quads with AAROM at knee -10 - 40 LLE Deficits / Details: 2-/5 quads with AAROM at knee -10-  60  Cervical / Trunk Assessment: Normal  Communication   Communication: No difficulties  Cognition Arousal/Alertness: Awake/alert Behavior During Therapy: WFL for tasks assessed/performed Overall Cognitive Status: Within Functional Limits for tasks assessed                      General Comments      Exercises Total Joint Exercises Ankle Circles/Pumps: AROM;Both;10 reps;Supine Quad Sets: AROM;Both;10 reps;Supine Heel Slides: AAROM;Both;10 reps;Supine Hip ABduction/ADduction: AAROM;Both;10 reps;Supine Straight Leg Raises: AAROM;Both;10 reps;Supine      Assessment/Plan    PT Assessment Patient needs continued PT services  PT Diagnosis Difficulty walking   PT Problem List Decreased strength;Decreased range of motion;Decreased activity tolerance;Decreased mobility;Decreased knowledge  of use of DME;Pain  PT Treatment Interventions DME instruction;Gait training;Stair training;Functional mobility training;Therapeutic activities;Therapeutic exercise;Patient/family education   PT Goals (Current goals can be found in the Care Plan section) Acute Rehab PT Goals Patient Stated Goal: Resume previous lifestyle with decreased pain PT Goal Formulation: With patient Time For Goal Achievement: 09/02/14 Potential to Achieve Goals: Good    Frequency 7X/week   Barriers to discharge        Co-evaluation               End of Session Equipment Utilized During Treatment: Right knee immobilizer;Left knee immobilizer;Gait belt Activity  Tolerance: Other (comment) (orthostatic) Patient left: in bed;with call bell/phone within reach Nurse Communication: Mobility status         Time: [id number] PT Time Calculation (min) (ACUTE ONLY): 28 min   Charges:   PT Evaluation $Initial PT Evaluation Tier I: 1 Procedure PT Treatments $Therapeutic Exercise: 8-22 mins $Therapeutic Activity: 8-22 mins   PT G Codes:          Dareion Kneece 08/26/2014, 2:36 PM

## 2014-08-26 NOTE — Progress Notes (Signed)
Subjective: 1 Day Post-Op Procedure(s) (LRB): TOTAL KNEE BILATERAL (Bilateral) Patient reports pain as moderate.    Objective: Vital signs in last 24 hours: Temp:  [96.4 F (35.8 C)-98.4 F (36.9 C)] 98.4 F (36.9 C) (12/19 1000) Pulse Rate:  [48-71] 71 (12/19 1000) Resp:  [8-16] 13 (12/19 1000) BP: (84-142)/(48-85) 112/63 mmHg (12/19 1000) SpO2:  [98 %-100 %] 100 % (12/19 1000)  Intake/Output from previous day: 12/18 0701 - 12/19 0700 In: 7502.2 [P.O.:480; I.V.:6967.2; IV Piggyback:55] Out: 3650 [Urine:3400; Blood:250] Intake/Output this shift: Total I/O In: 0  Out: 200 [Urine:200]   Recent Labs  08/26/14 0525  HGB 11.2*    Recent Labs  08/26/14 0525  WBC 6.2  RBC 3.69*  HCT 34.0*  PLT 217    Recent Labs  08/26/14 0525  NA 141  K 3.6*  CL 102  CO2 28  BUN 8  CREATININE 0.54  GLUCOSE 118*  CALCIUM 8.7   No results for input(s): LABPT, INR in the last 72 hours.  Intact pulses distally Dorsiflexion/Plantar flexion intact Incision: dressing C/D/I Compartment soft  Assessment/Plan: 1 Day Post-Op Procedure(s) (LRB): TOTAL KNEE BILATERAL (Bilateral) Will have anesthesia discontinue the epidural tomorrow am  Kathryne HitchBLACKMAN,Becky Cunningham 08/26/2014, 1:23 PM

## 2014-08-26 NOTE — Op Note (Signed)
NAMEASLEE, SUCH NO.:  [id number]  MEDICAL RECORD NO.:  [id number]  LOCATION:  1610                         FACILITY:  [facility]  PHYSICIAN:  Vanita Panda. 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  PROCEDURE:  Bilateral total knee arthroplasties.  IMPLANTS:  Both knees with Stryker Triathlon implants, both knees with size 3 femur, size 3 tibial tray, 11-mm fix-bearing polyethylene insert, size 29 patellar button.  SURGEON:  Vanita Panda. Magnus Ivan, M.D.  ASSISTANT:  Richardean Canal, P.A.  ANESTHESIA:  Continuous spinal epidural.  BLOOD LOSS:  Less than 100 mL, bilateral.  ANTIBIOTICS:  2 g of IV Ancef.  TOURNIQUET TIME:  59 minutes on the left leg and 46 minutes on the right leg.  COMPLICATIONS:  None.  INDICATIONS:  Becky Cunningham is a 63 year old, well known to me.  I have been seeing her for many years now and had performed a left knee arthroscopy many years ago due to significant arthritic changes and meniscal tear of her left knee.  Both knees have well-documented valgus deformities.  She is a very thin individual and is very active.  Her knees swell significantly.  We have drained them on occasions, we have provided steroid injections and supplement injections.  She is a very active individual and now her pain is daily, her mobility has been affected as well as her quality of life due to her knee pain.  X-rays do show tricompartmental arthritic changes with a valgus deformity of both knees.  At this point, she wished to proceed with bilateral total knee arthroplasty.  I have talked to her about this extensively, about increased morbidity and mortality, as associated to the  bilateral knee replacements and she understands this fully and still as persistent that we proceed with both due to the pain in both of her knees.  She understands the risks of acute blood loss anemia, nerve and vessel injury, fracture, infection, and DVT.  She understands that these are quite heightened in more significant and bilaterally knees patients. Again with her understanding of these, she still wish to proceed with bilateral knee replacements.  Her left knee is worse than her right, so we would proceeding with the left knee first, then the right knee second.  PROCEDURE DESCRIPTION:  After informed consent was obtained, appropriate left and right knees were marked and spinal anesthesia was obtained. She was laid supine on the operating table, Foley catheter was placed as well.  Both legs had thigh tourniquets placed on them, non-sterilely and then were sterilely prepped from the thighs down the ankle with DuraPrep and sterile drapes on both legs and sterile stockinettes.  Both legs were again prepped and draped at the same time.  Time-out was called and she was identified as correct patient and correct left and right knees. We then started with the left knee first.  We wrapped out that knee with Esmarch and the tourniquet was inflated to 300  mm of pressure.  We made a midline incision over the knee and carried this down the knee joint and performed a medial parapatellar arthrotomy and found to have large joint effusion.  Once we opened up the knee, we cleaned the debris from the medial and lateral meniscus, we found significant loss of cartilage on the lateral aspect of her knee as well as the patellofemoral joint. We then with the knee in the flexed position used our extramedullary cutting guide for the tibia taking 9 mm off the high side and correcting her varus and valgus and her slope was neutral.  We then made this cut. We then used an intramedullary guide for the femur and  took distal femoral cut of 8 mm.  The distal femoral cutting block set at 5 degrees left, externally rotated.  Once we made this cut, we brought the knee back down in extension and extension was full and almost hyperextended with a 9-mm block and with an 11-mm block and it was full.  We then went back to the femur and put our femoral sizing guide based off the epicondylar axis and Whitesides line.  We chose a size 3 femur.  We put our size 3 femur 4-in-1 cutting block on and made our anterior and posterior cuts followed by our chamfer cuts.  We then made our femoral box cut.  Attention was then turned back to the tibia.  We trialed for a size 3 tibia, which I was pleased on the placement of this.  We cut our keel based off this.  We then with the trial components in place, we put the knee through a range of motion with an 11-mm polyethylene insert and I was pleased with the mobility.  We then drilled holes and made our patellar cut, taking 8 mm off the patella and sized first 29 patellar button.  With all trial components in place, I was pleased with the range of motion and stability and removed all trial components.  We then irrigated the knee with normal saline solution and cleaned debris further from the knee.  We mixed our cement and then cemented the real Stryker Triathlon tibial tray, size 3, the real Stryker Triathlon femur size 3.  We placed the real 9-mm fix-bearing polyethylene insert and cemented the patellar button.  Once the cement had hardened, we let the tourniquet down and hemostasis was obtained with electrocautery.  We then closed the arthrotomy with interrupted #1 Vicryl suture followed by 0 Vicryl in the deep tissue, 2-0 Vicryl in the subcutaneous tissue, 4-0 Monocryl subcuticular stitch and Steri-Strips on the skin.  While the last letters are being closed by the physician's assistant, we started on the right knee.  We used an Esmarch to wrap out that leg and  the tourniquet was inflated to 300 mm of pressure.  We then made a midline incision over the right knee, assessing her valgus deformity as well. We dissected down the knee joint and performed a medial parapatellar arthrotomy on the right knee.  We found a large joint effusion and the same wear on the lateral compartment and the patellofemoral joint that we found on her opposite knee.  We then with the knee in a flexed position, used our tibial guide and took 9 mm off the high side correcting for negative slope and neutral varus-valgus.  We used intramedullary guide on the femur and made our 8-mm distal femoral cut. We then put our 4-in-1 cutting block on after putting our  sizing guide based off the epicondylar axis, 5 degrees, externally rotated to the right.  We then made our 4-in-1 cut for a size 3 femur and our femoral box cut.  We went back to the tibia and same as the opposite side.  We used a size 3 tibia trial and made our keel punch for this.  We then cut our patella as well and with all trial components in place, I was pleased with the stability and the range of motion just like her left side.  We then removed all trial instrumentation on the right side, irrigated the knee with normal saline solution and cleaned the debris from the knee.  We make sure cement and then cemented the real Stryker Triathlon tibial tray, size 3, the real size 3 femur.  We placed the real 11-mm polyethylene insert and cemented the patellar button.  After the cement hardened, we let the tourniquet down on this side and hemostasis was obtained with electrocautery.  We then closed in the same fashion of the left side with #1 Vicryl in the arthrotomy, 0 Vicryl in the deep tissue, 2-0 Vicryl in the subcutaneous tissue, 4-0 Monocryl subcuticular stitch, and Steri-Strips on the skin. We then placed well-padded dressings on both knees.  She was taken to the recovery room in stable condition.  All final counts  were correct. There were no complications noted.     Vanita Pandahristopher Y. Magnus IvanBlackman, M.D.     CYB/MEDQ  D:  08/25/2014  T:  08/26/2014  Job:  [id number]

## 2014-08-27 LAB — CBC
HEMATOCRIT: 25.6 % — AB (ref 36.0–46.0)
HEMOGLOBIN: 8.7 g/dL — AB (ref 12.0–15.0)
MCH: 30.7 pg (ref 26.0–34.0)
MCHC: 34 g/dL (ref 30.0–36.0)
MCV: 90.5 fL (ref 78.0–100.0)
Platelets: 172 10*3/uL (ref 150–400)
RBC: 2.83 MIL/uL — ABNORMAL LOW (ref 3.87–5.11)
RDW: 13.6 % (ref 11.5–15.5)
WBC: 7.8 10*3/uL (ref 4.0–10.5)

## 2014-08-27 MED ORDER — RIVAROXABAN 10 MG PO TABS
10.0000 mg | ORAL_TABLET | Freq: Every day | ORAL | Status: DC
  Administered 2014-08-27 – 2014-08-28 (×2): 10 mg via ORAL
  Filled 2014-08-27 (×3): qty 1

## 2014-08-27 NOTE — Progress Notes (Signed)
Subjective: 2 Days Post-Op Procedure(s) (LRB): TOTAL KNEE BILATERAL (Bilateral) Patient reports pain as moderate to severe .  No chest pain or SOB.  Objective: Vital signs in last 24 hours: Temp:  [97.9 F (36.6 C)-99.5 F (37.5 C)] 98.6 F (37 C) (12/20 0545) Pulse Rate:  [71-83] 83 (12/20 0545) Resp:  [10-16] 15 (12/20 0545) BP: (103-145)/(56-76) 103/56 mmHg (12/20 0545) SpO2:  [100 %] 100 % (12/20 0545)  Intake/Output from previous day: 12/19 0701 - 12/20 0700 In: 880 [P.O.:480; I.V.:400] Out: 700 [Urine:700] Intake/Output this shift:     Recent Labs  08/26/14 0525 08/27/14 0530  HGB 11.2* 8.7*    Recent Labs  08/26/14 0525 08/27/14 0530  WBC 6.2 7.8  RBC 3.69* 2.83*  HCT 34.0* 25.6*  PLT 217 172    Recent Labs  08/26/14 0525  NA 141  K 3.6*  CL 102  CO2 28  BUN 8  CREATININE 0.54  GLUCOSE 118*  CALCIUM 8.7   No results for input(s): LABPT, INR in the last 72 hours.  Bilateral total knee arthroplasties Sensation intact distally Intact pulses distally Dorsiflexion/Plantar flexion intact Incision: scant drainage Compartment soft  Assessment/Plan: 2 Days Post-Op Procedure(s) (LRB): TOTAL KNEE BILATERAL (Bilateral)  Will start on Xarelto for DVT prophylaxis Up with therapy after epidural removed Post-op anemia secondary to surgery will monitor for symptoms of anemia  Becky Cunningham 08/27/2014, 8:29 AM

## 2014-08-27 NOTE — Progress Notes (Signed)
Physical Therapy Treatment Patient Details Name: Becky JerichoRobin M Mallis MRN: [id number] DOB: 10/31/1950 Today's Date: 08/27/2014    History of Present Illness Bil TKR    PT Comments    POD # 2 am session.  Assisted pt OOB + 2 assist with B KI's and elevated bed.  Amb very limited distance due to MAX c/o pain and high anxiety.  Also max c/o dizziness.  Recliner brought to pt. Positioned in recliner, removed b KI's and performed B TKR TE's to tolerance.  Followed by ICE.  Follow Up Recommendations  SNF     Equipment Recommendations       Recommendations for Other Services       Precautions / Restrictions Precautions Precautions: Knee;Fall Precaution Comments: Instructed pt on KI use for amb may remove in bed Required Braces or Orthoses: Knee Immobilizer - Right;Knee Immobilizer - Left Knee Immobilizer - Right: Discontinue once straight leg raise with < 10 degree lag Knee Immobilizer - Left: Discontinue once straight leg raise with < 10 degree lag Restrictions Weight Bearing Restrictions: No Other Position/Activity Restrictions: WBAT    Mobility  Bed Mobility Overal bed mobility: +2 for physical assistance;+ 2 for safety/equipment;Needs Assistance Bed Mobility: Supine to Sit     Supine to sit: Mod assist;+2 for physical assistance;+2 for safety/equipment     General bed mobility comments: cues for sequence, Assist for Bil LEs and to control trunk up/down.  Increased time.  Transfers Overall transfer level: Needs assistance Equipment used: Rolling walker (2 wheeled) Transfers: Sit to/from Stand Sit to Stand: Mod assist;+2 physical assistance;+2 safety/equipment;From elevated surface         General transfer comment: 75% VC's on proper tech, hand placement and to decrease anxiety.  Assisted from elevated bed to recliner.  Ambulation/Gait Ambulation/Gait assistance: Max assist;+2 physical assistance;+2 safety/equipment Ambulation Distance (Feet): 4 Feet Assistive device:  Rolling walker (2 wheeled) Gait Pattern/deviations: Step-to pattern;Wide base of support Gait velocity: decreased   General Gait Details: 75% VC's to decrease anxiety level and instruct on proper sequencing.  Very limited amb distance due to max c/o dizziness BP dropped to 85/51 from 132/74 EOB.  RN notified.   Stairs            Wheelchair Mobility    Modified Rankin (Stroke Patients Only)       Balance                                    Cognition                            Exercises   Total Knee Replacement TE's 10 reps B LE ankle pumps 10 reps towel squeezes 10 reps knee presses 10 reps heel slides AAROM  10 reps SLR's AAROM 10 reps ABD AAROM Followed by ICE     General Comments        Pertinent Vitals/Pain Pain Assessment: 0-10 Pain Score: 8  Pain Location: B knees Pain Descriptors / Indicators: Aching;Constant;Sore Pain Intervention(s): Monitored during session;Premedicated before session;Repositioned;Ice applied    Home Living                      Prior Function            PT Goals (current goals can now be found in the care plan section) Progress towards PT goals: Progressing toward goals  Frequency  7X/week    PT Plan Current plan remains appropriate    Co-evaluation             End of Session Equipment Utilized During Treatment: Right knee immobilizer;Left knee immobilizer;Gait belt Activity Tolerance: Other (comment) (anxiety) Patient left: in chair;with call bell/phone within reach     Time: 1050-1129 PT Time Calculation (min) (ACUTE ONLY): 39 min  Charges:  $Gait Training: 8-22 mins $Therapeutic Exercise: 8-22 mins $Therapeutic Activity: 8-22 mins                    G Codes:      Felecia ShellingLori Ellery Tash  PTA [facility]  Acute  Rehab Pager      [phone]

## 2014-08-27 NOTE — Progress Notes (Signed)
Epidural removed by anesthesiologist. Becky Cunningham, Becky Cunningham

## 2014-08-27 NOTE — Care Management (Signed)
CARE MANAGEMENT NOTE 08/27/2014  Patient:  Becky JerichoDAVIS,Becky M   Account Number:  [id number]  Date Initiated:  08/26/2014  Documentation initiated by:  [facility]  Subjective/Objective Assessment:   Bilateral total knee arthroplasties     Action/Plan:   Anticipated DC Date:     Anticipated DC Plan:  SKILLED NURSING FACILITY  In-house referral  Clinical Social Worker      DC Planning Services  CM consult      Choice offered to / List presented to:             Status of service:  Completed, signed off Medicare Important Message given?   (If response is "NO", the following Medicare IM given date fields will be blank) Date Medicare IM given:   Medicare IM given by:   Date Additional Medicare IM given:   Additional Medicare IM given by:    Discharge Disposition:    Per UR Regulation:    If discussed at Long Length of Stay Meetings, dates discussed:    Comments:  08/27/14 - Spoke with patient. Reports she [facility]. Lives at home with her husband. He works. She would be alone during the day if discharged home. Referral to SW completed. Rubie Maidrystal Keontay Vora RN BSN CCM [phone]

## 2014-08-27 NOTE — Progress Notes (Signed)
OT Cancellation Note  Patient Details Name: Becky JerichoRobin M Dirocco MRN: [id number] DOB: May 17, 1951   Cancelled Treatment:    Reason Eval/Treat Not Completed: OT screened, no needs identified, will sign off - Pt with planned discharge to SNF and has Medicare so OT eval not needed for insurance approval.  All OT needs can be addressed at SNF.   Will sign off.   Angelene GiovanniConarpe, Franco Duley M  Alitza Cowman [place], OTR/L [phone]  08/27/2014, 12:06 PM

## 2014-08-27 NOTE — Progress Notes (Signed)
PHYSICAL THERAPY  Orthostatic Supine 153/78 EOB 132/74 Standing 85/51 witn c/o dizziness  Reported to RN  Felecia ShellingLori Nicholi Ghuman  PTA [facility]  Acute  Rehab Pager      [phone]

## 2014-08-27 NOTE — Progress Notes (Signed)
Epidural pulled out.  Tip intact.  Site looks good.  Will start Xarelto in 8 hours.  Darline Faith MD

## 2014-08-27 NOTE — Progress Notes (Signed)
Physical Therapy Treatment Patient Details Name: Becky JerichoRobin M Cunningham MRN: [id number] DOB: 07-05-1951 Today's Date: 08/27/2014    History of Present Illness Bil TKR    PT Comments    POD # 2 pm session.  Pt back in bed via nursing so performed B LR TKR TE's.    Follow Up Recommendations  SNF     Equipment Recommendations       Recommendations for Other Services       Precautions / Restrictions Precautions Precautions: Knee;Fall Precaution Comments: Instructed pt on KI use for amb may remove in bed Required Braces or Orthoses: Knee Immobilizer - Right;Knee Immobilizer - Left Knee Immobilizer - Right: Discontinue once straight leg raise with < 10 degree lag Knee Immobilizer - Left: Discontinue once straight leg raise with < 10 degree lag Restrictions Weight Bearing Restrictions: No Other Position/Activity Restrictions: WBAT    Mobility         Stairs            Wheelchair Mobility    Modified Rankin (Stroke Patients Only)       Balance                                    Cognition                            Exercises   Total Knee Replacement TE's 10 reps B LE ankle pumps 10 reps towel squeezes 10 reps knee presses 10 reps heel slides AAROM 10 reps SAQ's AAROM 10 reps SLR's AAROM 10 reps ABD AAROM Followed by ICE     General Comments        Pertinent Vitals/Pain Pain Assessment: 0-10 Pain Score: 8  Pain Location: B knees Pain Descriptors / Indicators: Aching;Constant;Sore Pain Intervention(s): Monitored during session;Premedicated before session;Repositioned;Ice applied    Home Living                      Prior Function            PT Goals (current goals can now be found in the care plan section) Progress towards PT goals: Progressing toward goals    Frequency  7X/week    PT Plan Current plan remains appropriate    Co-evaluation             End of Session Equipment Utilized During  Treatment: Right knee immobilizer;Left knee immobilizer;Gait belt Activity Tolerance: Other (comment) (anxiety) Patient left: in chair;with call bell/phone within reach     Time: [id number] PT Time Calculation (min) (ACUTE ONLY): 24 min  Charges:   $Therapeutic Exercise: 23-37 min                    G Codes:      Felecia ShellingLori Saturnino Liew  PTA [facility]  Acute  Rehab Pager      [phone]

## 2014-08-27 NOTE — Addendum Note (Signed)
Addendum  created 08/27/14 0844 by Gaetano Hawthorneharles L Tobe Kervin, MD   Modules edited: Anesthesia LDA, Clinical Notes, Lines/Drains/Airways Properties Editor   Clinical Notes:  File: [id number]   Lines/Drains/Airways Properties Editor:  Properties of line/drain/airway/wound [REMOVED] Epidural Catheter have been modified.

## 2014-08-27 NOTE — Plan of Care (Signed)
Problem: Phase III Progression Outcomes Goal: Pain controlled on oral analgesia Outcome: Progressing Epidural with PO meds.

## 2014-08-28 ENCOUNTER — Encounter (HOSPITAL_COMMUNITY): Payer: Self-pay | Admitting: Orthopaedic Surgery

## 2014-08-28 LAB — CBC
HEMATOCRIT: 24.6 % — AB (ref 36.0–46.0)
HEMOGLOBIN: 8.3 g/dL — AB (ref 12.0–15.0)
MCH: 30.6 pg (ref 26.0–34.0)
MCHC: 33.7 g/dL (ref 30.0–36.0)
MCV: 90.8 fL (ref 78.0–100.0)
Platelets: 179 10*3/uL (ref 150–400)
RBC: 2.71 MIL/uL — ABNORMAL LOW (ref 3.87–5.11)
RDW: 13.7 % (ref 11.5–15.5)
WBC: 6.2 10*3/uL (ref 4.0–10.5)

## 2014-08-28 MED ORDER — FERROUS SULFATE 325 (65 FE) MG PO TABS
325.0000 mg | ORAL_TABLET | Freq: Three times a day (TID) | ORAL | Status: DC
  Administered 2014-08-28 – 2014-08-29 (×5): 325 mg via ORAL
  Filled 2014-08-28 (×9): qty 1

## 2014-08-28 NOTE — Progress Notes (Signed)
Physical Therapy Treatment Patient Details Name: Becky JerichoRobin M Cunningham MRN: [id number] DOB: 01/07/1951 Today's Date: 08/28/2014    History of Present Illness Bil TKR    PT Comments    POD # 3 am session.  Applied B KI's as pt is unable to perform active SLR.  [facility] then pt tolerated amb in hallway.    Follow Up Recommendations  SNF ([facility])     Equipment Recommendations       Recommendations for Other Services       Precautions / Restrictions Precautions Precautions: Knee;Fall Precaution Comments: Instructed pt on KI use for amb may remove in bed Required Braces or Orthoses: Knee Immobilizer - Right;Knee Immobilizer - Left Knee Immobilizer - Right: Discontinue once straight leg raise with < 10 degree lag Knee Immobilizer - Left: Discontinue once straight leg raise with < 10 degree lag Restrictions Weight Bearing Restrictions: No Other Position/Activity Restrictions: WBAT    Mobility  Bed Mobility Overal bed mobility: +2 for physical assistance;+ 2 for safety/equipment;Needs Assistance Bed Mobility: Supine to Sit       Sit to supine: Mod assist;+2 for physical assistance;+2 for safety/equipment;Min assist   General bed mobility comments: cues for sequence, Assist for Bil LEs and to control trunk up/down.  Increased time.  Transfers Overall transfer level: Needs assistance Equipment used: Rolling walker (2 wheeled) Transfers: Sit to/from Stand Sit to Stand: Mod assist;+2 physical assistance;+2 safety/equipment;From elevated surface         General transfer comment[facility] VC's on safety plus less anxiety today.  Ambulation/Gait Ambulation/Gait assistance: +2 physical assistance;Mod assist Ambulation Distance (Feet): 22 Feet Assistive device: Rolling walker (2 wheeled) Gait Pattern/deviations: Step-to pattern;Decreased step length - right;Decreased step length - left Gait velocity: decreased   General Gait  Details: No anxiety and no c/o dizziness today.  Pt tolerated amb in hallway with + 2 assist and B KI's.   Stairs            Wheelchair Mobility    Modified Rankin (Stroke Patients Only)       Balance                                    Cognition                            Exercises      General Comments        Pertinent Vitals/Pain      Home Living                      Prior Function            PT Goals (current goals can now be found in the care plan section) Progress towards PT goals: Progressing toward goals    Frequency  7X/week    PT Plan      Co-evaluation             End of Session Equipment Utilized During Treatment: Right knee immobilizer;Left knee immobilizer;Gait belt Activity Tolerance: Patient tolerated treatment well Patient left: in chair;with call bell/phone within reach     Time: 1329-1353 PT Time Calculation (min) (ACUTE ONLY): 24 min  Charges:  $Gait Training: 8-22 mins $Therapeutic Activity: 8-22 mins  G Codes:      Rica Koyanagi  PTA [facility]  Acute  Rehab Pager      [phone]

## 2014-08-28 NOTE — Progress Notes (Signed)
Physical Therapy Treatment Patient Details Name: Becky Cunningham MRN: [id number] DOB: 10-09-1950 Today's Date: 08/28/2014    History of Present Illness Bil TKR    PT Comments    POD # 3 pm session.  [facility] then back to bed to perform B TKR TE's followed by ICE.  Follow Up Recommendations  SNF ([facility])     Equipment Recommendations       Recommendations for Other Services       Precautions / Restrictions Precautions Precautions: Knee;Fall Precaution Comments: Instructed pt on KI use for amb may remove in bed Required Braces or Orthoses: Knee Immobilizer - Right;Knee Immobilizer - Left Knee Immobilizer - Right: Discontinue once straight leg raise with < 10 degree lag Knee Immobilizer - Left: Discontinue once straight leg raise with < 10 degree lag Restrictions Weight Bearing Restrictions: No Other Position/Activity Restrictions: WBAT    Mobility  Bed Mobility Overal bed mobility: +2 for physical assistance;Needs Assistance Bed Mobility: Supine to Sit       Sit to supine: Mod assist;+2 for physical assistance;+2 for safety/equipment;Min assist   General bed mobility comments: Asssited back into bed  Transfers Overall transfer level: Needs assistance Equipment used: Rolling walker (2 wheeled) Transfers: Sit to/from Stand Sit to Stand: Mod assist;+2 physical assistance;+2 safety/equipment;From elevated surface         General transfer comment[facility]  Ambulation/Gait Ambulation/Gait assistance: +2 physical assistance;Mod assist Ambulation Distance (Feet): 2 Feet Assistive device: Rolling walker (2 wheeled) Gait Pattern/deviations: Step-to pattern;Decreased step length - right;Decreased step length - left Gait velocity: decreased   General Gait Details: just a few steps [facility] this session due to increased c/o pain   Stairs            Wheelchair Mobility     Modified Rankin (Stroke Patients Only)       Balance                                    Cognition                            Exercises   Bil Total Knee Replacement TE's 10 reps B LE ankle pumps 10 reps towel squeezes 10 reps knee presses 10 reps heel slides  10 reps SAQ's 10 reps SLR's 10 reps ABD Followed by ICE    General Comments        Pertinent Vitals/Pain      Home Living                      Prior Function            PT Goals (current goals can now be found in the care plan section) Progress towards PT goals: Progressing toward goals    Frequency  7X/week    PT Plan      Co-evaluation             End of Session Equipment Utilized During Treatment: Right knee immobilizer;Left knee immobilizer;Gait belt Activity Tolerance: Patient tolerated treatment well Patient left: in bed;with call bell/phone within reach     Time: 1506-1530 PT Time Calculation (min) (ACUTE ONLY): 24 min  Charges:  $Gait Training: 8-22 mins $Therapeutic Exercise: 8-22 mins  G Codes:      Rica Koyanagi  PTA [facility]  Acute  Rehab Pager      [phone]

## 2014-08-28 NOTE — Clinical Social Work Note (Signed)
[facility]. FL2 on chart for MD signature- will f/u tomorrow for further dc planning.  Becky LevyJanet Konrad Hoak, MSW, Theresia MajorsLCSWA [phone]

## 2014-08-28 NOTE — Clinical Social Work Placement (Addendum)
Clinical Social Work Department CLINICAL SOCIAL WORK PLACEMENT NOTE 08/29/2014  Patient:  Becky Cunningham,[name]  Account Number:  [id number] Admit date:  08/25/2014  Clinical Social Worker:  Meaghen SearingJANET Zoelle Markus, LCSWA  Date/time:  08/28/2014 02:30 PM  Clinical Social Work is seeking post-discharge placement for this patient at the following level of care:   SKILLED NURSING   (*CSW will update this form in Epic as items are completed)   08/28/2014  Patient/family provided with [facility] of Clinical Social Work's list of facilities offering this level of care within the geographic area requested by the patient (or if unable, by the patient's family).  08/28/2014  Patient/family informed of their freedom to choose among providers that offer the needed level of care, that participate in Medicare, Medicaid or managed care program needed by the patient, have an available bed and are willing to accept the patient.  08/28/2014  Patient/family informed of [facility]' ownership interest in [facility], as well as of the fact that they are under no obligation to receive care at this facility.  PASARR submitted to EDS on 08/28/2014 PASARR number received on 08/28/2014  FL2 transmitted to all facilities in geographic area requested by pt/family on  08/28/2014 FL2 transmitted to all facilities within larger geographic area on   Patient informed that his/her managed care company has contracts with or will negotiate with  certain facilities, including the following:     Patient/family informed of bed offers received:  08/28/14 [facility] and patient chooses bed at    [facility]  08/[facility]/14 Patient to be transferred to facility by [facility] Patient and family notified of transfer on 08/29/14 Name of family member notified:  Sister Liborio NixonJanice at bedside with patient   The following physician request were entered in  Epic:   Additional Comments:    Reece LevyJanet Masae Lukacs, MSW, Theresia MajorsLCSWA [phone]

## 2014-08-28 NOTE — Discharge Instructions (Signed)
Information on my medicine - XARELTO® (Rivaroxaban) ° °This medication education was reviewed with me or my healthcare representative as part of my discharge preparation.  The pharmacist that spoke with me during my hospital stay was:  Muriel Hannold K, RPH ° °Why was Xarelto® prescribed for you? °Xarelto® was prescribed for you to reduce the risk of blood clots forming after orthopedic surgery. The medical term for these abnormal blood clots is venous thromboembolism (VTE). ° °What do you need to know about xarelto® ? °Take your Xarelto® ONCE DAILY at the same time every day. °You may take it either with or without food. ° °If you have difficulty swallowing the tablet whole, you may crush it and mix in applesauce just prior to taking your dose. ° °Take Xarelto® exactly as prescribed by your doctor and DO NOT stop taking Xarelto® without talking to the doctor who prescribed the medication.  Stopping without other VTE prevention medication to take the place of Xarelto® may increase your risk of developing a clot. ° °After discharge, you should have regular check-up appointments with your healthcare provider that is prescribing your Xarelto®.   ° °What do you do if you miss a dose? °If you miss a dose, take it as soon as you remember on the same day then continue your regularly scheduled once daily regimen the next day. Do not take two doses of Xarelto® on the same day.  ° °Important Safety Information °A possible side effect of Xarelto® is bleeding. You should call your healthcare provider right away if you experience any of the following: °  Bleeding from an injury or your nose that does not stop. °  Unusual colored urine (red or dark brown) or unusual colored stools (red or black). °  Unusual bruising for unknown reasons. °  A serious fall or if you hit your head (even if there is no bleeding). ° °Some medicines may interact with Xarelto® and might increase your risk of bleeding while on Xarelto®. To help avoid  this, consult your healthcare provider or pharmacist prior to using any new prescription or non-prescription medications, including herbals, vitamins, non-steroidal anti-inflammatory drugs (NSAIDs) and supplements. ° °This website has more information on Xarelto®: www.xarelto.com. ° ° °

## 2014-08-28 NOTE — Clinical Social Work Psychosocial (Signed)
Clinical Social Work Department BRIEF PSYCHOSOCIAL ASSESSMENT 08/28/2014  Patient:  Becky Cunningham, Becky Cunningham     Account Number:  [id number]     Admit date:  08/25/2014  Clinical Social Worker:  Daiva Huge  Date/Time:  08/28/2014 02:22 PM  Referred by:  Physician  Date Referred:  08/26/2014 Referred for  SNF Placement   Other Referral:   Interview type:  Patient Other interview type:    PSYCHOSOCIAL DATA Living Status:  FAMILY Admitted from facility:   Level of care:   Primary support name:  husband Primary support relationship to patient:  FAMILY Degree of support available:   good    CURRENT CONCERNS Current Concerns  Post-Acute Placement   Other Concerns:    SOCIAL WORK ASSESSMENT / PLAN Met with patient who reports she plans to go to SNF at d/c for rehab. "my husband works fulltime". She is interested in [facility] and I have spoken with them and they are able to offer and accept patient at d/c.  Patient is motivated and prepared to do SNF rehab until she is able to return home with her husband but because he works, she will need to be independent and safe prior to dc home.   Assessment/plan status:  Other - See comment Other assessment/ plan:   FL2 and PASARR for SNF   Information/referral to community resources:   SNF list    PATIENT'S/FAMILY'S RESPONSE TO PLAN OF CARE: Patient agreed to SNF search and is accepting of bed at [facility]. We are uncertain of when she may be ready for SNF but I will prepare [place] for possibly tomorrow- FL2 to be placed on chart for MD as well.       Eduard Clos, MSW, [place]

## 2014-08-28 NOTE — Progress Notes (Signed)
Subjective: 3 Days Post-Op Procedure(s) (LRB): TOTAL KNEE BILATERAL (Bilateral) Patient reports pain as moderate.  Now on Xarelto.  Epidural out yesterday.  Asymptomatic acute blood loss anemai.  Objective: Vital signs in last 24 hours: Temp:  [98.2 F (36.8 C)-99.7 F (37.6 C)] 99.6 F (37.6 C) (12/21 0619) Pulse Rate:  [85-91] 85 (12/21 0619) Resp:  [15-22] 18 (12/21 0619) BP: (85-158)/(51-78) 116/65 mmHg (12/21 0619) SpO2:  [100 %] 100 % (12/21 0619)  Intake/Output from previous day: 12/20 0701 - 12/21 0700 In: 1990 [P.O.:1320; I.V.:670] Out: 3450 [Urine:3450] Intake/Output this shift:     Recent Labs  08/26/14 0525 08/27/14 0530 08/28/14 0508  HGB 11.2* 8.7* 8.3*    Recent Labs  08/27/14 0530 08/28/14 0508  WBC 7.8 6.2  RBC 2.83* 2.71*  HCT 25.6* 24.6*  PLT 172 179    Recent Labs  08/26/14 0525  NA 141  K 3.6*  CL 102  CO2 28  BUN 8  CREATININE 0.54  GLUCOSE 118*  CALCIUM 8.7   No results for input(s): LABPT, INR in the last 72 hours.  Sensation intact distally Intact pulses distally Dorsiflexion/Plantar flexion intact Incision: scant drainage No cellulitis present Compartment soft  Assessment/Plan: 3 Days Post-Op Procedure(s) (LRB): TOTAL KNEE BILATERAL (Bilateral) Up with therapy Plan for discharge tomorrow Discharge to SNF  Check CBC in am Needs FL-2 on chart.  Becky Cunningham Y 08/28/2014, 7:12 AM

## 2014-08-28 NOTE — Clinical Social Work Note (Signed)
CSW met with patient this morning- she is requesting SNF placement at dc for rehab- s/p bilateral TKR's. CSW will proceed with SNF search and advise on offers as rec'd- Full assessment to follow- Janet Caldwell, MSW, LCSWA [phone] 

## 2014-08-29 DIAGNOSIS — F329 Major depressive disorder, single episode, unspecified: Secondary | ICD-10-CM | POA: Diagnosis not present

## 2014-08-29 DIAGNOSIS — E46 Unspecified protein-calorie malnutrition: Secondary | ICD-10-CM | POA: Diagnosis not present

## 2014-08-29 DIAGNOSIS — F909 Attention-deficit hyperactivity disorder, unspecified type: Secondary | ICD-10-CM | POA: Diagnosis not present

## 2014-08-29 DIAGNOSIS — M62838 Other muscle spasm: Secondary | ICD-10-CM | POA: Diagnosis not present

## 2014-08-29 DIAGNOSIS — D62 Acute posthemorrhagic anemia: Secondary | ICD-10-CM | POA: Diagnosis not present

## 2014-08-29 DIAGNOSIS — Z96653 Presence of artificial knee joint, bilateral: Secondary | ICD-10-CM | POA: Diagnosis not present

## 2014-08-29 DIAGNOSIS — F339 Major depressive disorder, recurrent, unspecified: Secondary | ICD-10-CM | POA: Diagnosis not present

## 2014-08-29 DIAGNOSIS — M129 Arthropathy, unspecified: Secondary | ICD-10-CM | POA: Diagnosis not present

## 2014-08-29 DIAGNOSIS — M25569 Pain in unspecified knee: Secondary | ICD-10-CM | POA: Diagnosis not present

## 2014-08-29 DIAGNOSIS — M1990 Unspecified osteoarthritis, unspecified site: Secondary | ICD-10-CM | POA: Diagnosis not present

## 2014-08-29 DIAGNOSIS — M170 Bilateral primary osteoarthritis of knee: Secondary | ICD-10-CM | POA: Diagnosis not present

## 2014-08-29 DIAGNOSIS — Z471 Aftercare following joint replacement surgery: Secondary | ICD-10-CM | POA: Diagnosis not present

## 2014-08-29 DIAGNOSIS — K5900 Constipation, unspecified: Secondary | ICD-10-CM | POA: Diagnosis not present

## 2014-08-29 DIAGNOSIS — K5901 Slow transit constipation: Secondary | ICD-10-CM | POA: Diagnosis not present

## 2014-08-29 LAB — CBC
HEMATOCRIT: 22.3 % — AB (ref 36.0–46.0)
Hemoglobin: 7.6 g/dL — ABNORMAL LOW (ref 12.0–15.0)
MCH: 30.5 pg (ref 26.0–34.0)
MCHC: 34.1 g/dL (ref 30.0–36.0)
MCV: 89.6 fL (ref 78.0–100.0)
Platelets: 198 10*3/uL (ref 150–400)
RBC: 2.49 MIL/uL — ABNORMAL LOW (ref 3.87–5.11)
RDW: 13.5 % (ref 11.5–15.5)
WBC: 5.6 10*3/uL (ref 4.0–10.5)

## 2014-08-29 LAB — PREPARE RBC (CROSSMATCH)

## 2014-08-29 MED ORDER — OXYCODONE-ACETAMINOPHEN 5-325 MG PO TABS: 1.0000 | ORAL_TABLET | ORAL | Status: DC | PRN

## 2014-08-29 MED ORDER — FERROUS SULFATE 325 (65 FE) MG PO TABS: 325.0000 mg | ORAL_TABLET | Freq: Three times a day (TID) | ORAL | Status: AC

## 2014-08-29 MED ORDER — SODIUM CHLORIDE 0.9 % IV SOLN: Freq: Once | INTRAVENOUS | Status: DC

## 2014-08-29 MED ORDER — RIVAROXABAN 10 MG PO TABS: 10.0000 mg | ORAL_TABLET | Freq: Every day | ORAL | Status: DC

## 2014-08-29 MED ORDER — DSS 100 MG PO CAPS: 100.0000 mg | ORAL_CAPSULE | Freq: Two times a day (BID) | ORAL | Status: DC

## 2014-08-29 NOTE — Discharge Summary (Signed)
Patient ID: Becky JerichoRobin M Fells MRN: [id number] DOB/AGE: [birth year] 65 y.o.  Admit date: 08/25/2014 Discharge date: 08/29/2014  Admission Diagnoses:  Principal Problem:   Primary osteoarthritis of both knees Active Problems:   Status post bilateral knee replacements   Discharge Diagnoses:  Same  Past Medical History  Diagnosis Date  . Depression   . Arthritis     Surgeries: Procedure(s): TOTAL KNEE BILATERAL on 08/25/2014   Consultants:    Discharged Condition: Improved  Hospital Course: Becky Cunningham is an 63 y.o. female who was admitted 08/25/2014 for operative treatment ofArthritis of both knees. Patient has severe unremitting pain that affects sleep, daily activities, and work/hobbies. After pre-op clearance the patient was taken to the operating room on 08/25/2014 and underwent  Procedure(s): TOTAL KNEE BILATERAL.    Patient was given perioperative antibiotics: Anti-infectives    Start     Dose/Rate Route Frequency Ordered Stop   08/25/14 1600  ceFAZolin (ANCEF) IVPB 1 g/50 mL premix     1 g100 mL/hr over 30 Minutes Intravenous Every 6 hours 08/25/14 1502 08/26/14 0002   08/25/14 0803  ceFAZolin (ANCEF) IVPB 2 g/50 mL premix     2 g100 mL/hr over 30 Minutes Intravenous On call to O.R. 08/25/14 0803 08/25/14 1012       Patient was given sequential compression devices, early ambulation, and chemoprophylaxis to prevent DVT.  Patient benefited maximally from hospital stay and there were no complications.  She did receive one unti of blood prior to discharge.  Recent vital signs: Patient Vitals for the past 24 hrs:  BP Temp Temp src Pulse Resp SpO2  08/29/14 0555 124/66 mmHg 98 F (36.7 C) Oral 83 16 98 %  08/28/14 2059 (!) 143/79 mmHg 99.2 F (37.3 C) Oral 82 16 100 %  08/28/14 1326 123/69 mmHg 99 F (37.2 C) Oral 86 18 100 %     Recent laboratory studies:  Recent Labs  08/28/14 0508 08/29/14 0440  WBC 6.2 5.6  HGB 8.3* 7.6*  HCT 24.6* 22.3*  PLT 179 198      Discharge Medications:     Medication List    STOP taking these medications        etodolac 500 MG tablet  Commonly known as:  LODINE      TAKE these medications        amphetamine-dextroamphetamine 20 MG tablet  Commonly known as:  ADDERALL  Take 40 mg by mouth every morning.     CALCIUM PO  Take 500 mg by mouth every morning.     DSS 100 MG Caps  Take 100 mg by mouth 2 (two) times daily.     ferrous sulfate 325 (65 FE) MG tablet  Take 1 tablet (325 mg total) by mouth 3 (three) times daily with meals.     FLUoxetine 20 MG capsule  Commonly known as:  PROZAC  Take 60 mg by mouth daily at 12 noon.     oxyCODONE-acetaminophen 5-325 MG per tablet  Commonly known as:  ROXICET  Take 1-2 tablets by mouth every 4 (four) hours as needed.     PROBIOTIC PO  Take 1 scoop by mouth every morning. Probiotic Shake--"Natural Vitality"     PROBIOTIC PO  Take 1 scoop by mouth every morning. Green Fusion. --shake     rivaroxaban 10 MG Tabs tablet  Commonly known as:  XARELTO  Take 1 tablet (10 mg total) by mouth daily with supper.     vitamin C 1000 MG  tablet  Take 1,000 mg by mouth every morning.        Diagnostic Studies: Dg Knee Left Port  08/25/2014   CLINICAL DATA:  Status post knee replacement  EXAM: PORTABLE LEFT KNEE - 1-2 VIEW  COMPARISON:  None.  FINDINGS: Left knee replacement is noted. Considerable air is noted in the surgical bed. No acute bony abnormality is noted.  IMPRESSION: Status post left knee replacement.   Electronically Signed   By: Alcide CleverMark  Lukens M.D.   On: 08/25/2014 14:29   Dg Knee Right Port  08/25/2014   CLINICAL DATA:  Status post total knee replaced  EXAM: PORTABLE RIGHT KNEE - 1-2 VIEW  COMPARISON:  Jan 10, 2010  FINDINGS: Frontal and lateral views were obtained. Patient is status post total knee replacement with femoral and tibial components appearing well-seated. No fracture or dislocation. Air within the joint is an expected postoperative  finding.  IMPRESSION: Prosthetic components appear well seated. No fracture or dislocation.   Electronically Signed   By: Bretta BangWilliam  Woodruff M.D.   On: 08/25/2014 14:30    Disposition:   To skilled nursing facility      Discharge Instructions    Call MD / Call 911    Complete by:  As directed   If you experience chest pain or shortness of breath, CALL 911 and be transported to the hospital emergency room.  If you develope a fever above 101 F, pus (white drainage) or increased drainage or redness at the wound, or calf pain, call your surgeon's office.     Constipation Prevention    Complete by:  As directed   Drink plenty of fluids.  Prune juice may be helpful.  You may use a stool softener, such as Colace (over the counter) 100 mg twice a day.  Use MiraLax (over the counter) for constipation as needed.     Diet - low sodium heart healthy    Complete by:  As directed      Discharge instructions    Complete by:  As directed   Increase activities as comfort allows. Full weight bearing as tolerated and range-of-motion both knees. Please leave current bilateral knee dressings alone and in place until her outpatient follow-up. Can get dressings wet in the shower daily.     Discharge patient    Complete by:  As directed      Increase activity slowly as tolerated    Complete by:  As directed            Follow-up Information    Follow up with Kathryne HitchBLACKMAN,CHRISTOPHER Y, MD In 1 week.   Specialty:  Orthopedic Surgery   Contact information:   [address] [place] [address] [phone]        Signed: Kathryne HitchBLACKMAN,CHRISTOPHER Y 08/29/2014, 7:28 AM

## 2014-08-29 NOTE — Progress Notes (Signed)
Subjective: 4 Days Post-Op Procedure(s) (LRB): TOTAL KNEE BILATERAL (Bilateral) Patient reports pain as mild.  Hgb down to 7.6.  Vitals stable.  Objective: Vital signs in last 24 hours: Temp:  [98 F (36.7 C)-99.2 F (37.3 C)] 98 F (36.7 C) (12/22 0555) Pulse Rate:  [82-86] 83 (12/22 0555) Resp:  [16-18] 16 (12/22 0555) BP: (123-143)/(66-79) 124/66 mmHg (12/22 0555) SpO2:  [98 %-100 %] 98 % (12/22 0555)  Intake/Output from previous day: 12/21 0701 - 12/22 0700 In: 1320 [P.O.:1320] Out: 3550 [Urine:3550] Intake/Output this shift:     Recent Labs  08/27/14 0530 08/28/14 0508 08/29/14 0440  HGB 8.7* 8.3* 7.6*    Recent Labs  08/28/14 0508 08/29/14 0440  WBC 6.2 5.6  RBC 2.71* 2.49*  HCT 24.6* 22.3*  PLT 179 198   No results for input(s): NA, K, CL, CO2, BUN, CREATININE, GLUCOSE, CALCIUM in the last 72 hours. No results for input(s): LABPT, INR in the last 72 hours.  Sensation intact distally Intact pulses distally Dorsiflexion/Plantar flexion intact Incision: scant drainage Compartment soft  Assessment/Plan: 4 Days Post-Op Procedure(s) (LRB): TOTAL KNEE BILATERAL (Bilateral) Discharge to SNF  Today. Transfuse one unit of blood prior to discharge.   Kathryne HitchBLACKMAN,Taytum Wheller Y 08/29/2014, 7:23 AM

## 2014-08-29 NOTE — Clinical Social Work Note (Signed)
[facility] Family and patient agreeable to this plan- patient appreciative of everyone's care and assistance.- plan transfer via EMS. Reece LevyJanet Sejal Cofield, MSW, Theresia MajorsLCSWA [phone]

## 2014-08-29 NOTE — Progress Notes (Signed)
Physical Therapy Treatment Patient Details Name: Becky JerichoRobin M Cunningham MRN: [id number] DOB: [birth year] Today's Date: 08/29/2014    History of Present Illness Bil TKR    PT Comments    POD # 4 pt currently receiving one unit of blood so left pt in bed to perform B TKR TE's.  Very limited knee flexion.  Pt self limiting and demonstrates increased guarding/tightness when performing any knee ROM.  Pt plans to D/C to Becky Cunningham later today.   Follow Up Recommendations  SNF     Equipment Recommendations       Recommendations for Other Services       Precautions / Restrictions Precautions Precautions: Knee;Fall Precaution Comments: Instructed pt on KI use for amb may remove in bed Required Braces or Orthoses: Knee Immobilizer - Right;Knee Immobilizer - Left Knee Immobilizer - Right: Discontinue once straight leg raise with < 10 degree lag Knee Immobilizer - Left: Discontinue once straight leg raise with < 10 degree lag Restrictions Weight Bearing Restrictions: No Other Position/Activity Restrictions: WBAT    Mobility  Bed Mobility                  Transfers                    Ambulation/Gait                 Stairs            Wheelchair Mobility    Modified Rankin (Stroke Patients Only)       Balance                                    Cognition                            Exercises   Bilateral Total Knee Replacement TE's 10 reps B LE ankle pumps 10 reps towel squeezes 10 reps knee presses 10 reps heel slides  10 reps SAQ's 10 reps SLR's 10 reps ABD Followed by ICE     General Comments        Pertinent Vitals/Pain      Home Living                      Prior Function            PT Goals (current goals can now be found in the care plan section) Progress towards PT goals: Progressing toward goals    Frequency  7X/week    PT Plan      Co-evaluation             End of Session            Time: 1191-47821115-1145 PT Time Calculation (min) (ACUTE ONLY): 30 min  Charges:  $Therapeutic Exercise: 23-37 mins                    G Codes:      Felecia ShellingLori Shandel Busic  PTA [facility]  Acute  Rehab Pager      [phone]

## 2014-08-30 LAB — TYPE AND SCREEN
ABO/RH(D): O POS
ANTIBODY SCREEN: NEGATIVE
Unit division: 0

## 2014-08-31 ENCOUNTER — Non-Acute Institutional Stay (SKILLED_NURSING_FACILITY): Payer: Medicare Other | Admitting: Internal Medicine

## 2014-08-31 DIAGNOSIS — D62 Acute posthemorrhagic anemia: Secondary | ICD-10-CM | POA: Diagnosis not present

## 2014-08-31 DIAGNOSIS — F909 Attention-deficit hyperactivity disorder, unspecified type: Secondary | ICD-10-CM | POA: Diagnosis not present

## 2014-08-31 DIAGNOSIS — M62838 Other muscle spasm: Secondary | ICD-10-CM

## 2014-08-31 DIAGNOSIS — M170 Bilateral primary osteoarthritis of knee: Secondary | ICD-10-CM

## 2014-08-31 DIAGNOSIS — K5901 Slow transit constipation: Secondary | ICD-10-CM

## 2014-08-31 DIAGNOSIS — F988 Other specified behavioral and emotional disorders with onset usually occurring in childhood and adolescence: Secondary | ICD-10-CM

## 2014-08-31 NOTE — Progress Notes (Signed)
Patient ID: Ricardo JerichoRobin M Christmas, female   DOB: March 10, 1951, 63 y.o.   MRN: [id number]     [facility]   PCP: Shirlean MylarWEBB, CAROL, D, MD  Code Status: full code  Allergies  Allergen Reactions  . Other Other (See Comments)    Sugar enzymes--Throat swelling/scratchy. (when eats something really sweet)    Chief Complaint  Patient presents with  . New Admit To SNF     HPI:  63 yo female pt is seen for post hospitalization follow up. She was in the hospital from 08/25/14- 08/29/14 with primary OA of both knees and underwent both knee replacements. She is seen in her room today. She complaints of muscle spasm. Her pain is under control with current regimen. She denies any other concerns.   Review of Systems:  Constitutional: Negative for fever, chills, malaise/fatigue and diaphoresis.  HENT: Negative for congestion   Eyes: Negative for eye pain, blurred vision, double vision and discharge. wears glasses Respiratory: Negative for cough, shortness of breath and wheezing.   Cardiovascular: Negative for chest pain, palpitations, leg swelling.  Gastrointestinal: Negative for heartburn, nausea, vomiting, abdominal pain, diarrhea and constipation. appetite is normal Genitourinary: Negative for dysuria.  Musculoskeletal: Negative for back pain, falls Skin: Negative for itching, rash.  Neurological: Negative for weakness,dizziness, tingling, focal weakness and headaches.  Psychiatric/Behavioral: Negative for depression  Past Medical History  Diagnosis Date  . Depression   . Arthritis    Past Surgical History  Procedure Laterality Date  . Dilation and curettage of uterus    . Knee arthroscopy  2009    left   . Wisdom tooth extraction    . Tmj similar surgery     . Total knee arthroplasty Bilateral 08/25/2014    Procedure: TOTAL KNEE BILATERAL;  Surgeon: Kathryne Hitchhristopher Y Blackman, MD;  Location: [facility];  Service: Orthopedics;  Laterality: Bilateral;   Social  History:   reports that she has never smoked. She has never used smokeless tobacco. She reports that she drinks about 8.4 oz of alcohol per week. She reports that she does not use illicit drugs.  No family history on file.  Medications: Patient's Medications  New Prescriptions   No medications on file  Previous Medications   AMPHETAMINE-DEXTROAMPHETAMINE (ADDERALL) 20 MG TABLET    Take 40 mg by mouth every morning.   ASCORBIC ACID (VITAMIN C) 1000 MG TABLET    Take 1,000 mg by mouth every morning.   CALCIUM PO    Take 500 mg by mouth every morning.   DOCUSATE SODIUM 100 MG CAPS    Take 100 mg by mouth 2 (two) times daily.   FERROUS SULFATE 325 (65 FE) MG TABLET    Take 1 tablet (325 mg total) by mouth 3 (three) times daily with meals.   FLUOXETINE (PROZAC) 20 MG CAPSULE    Take 60 mg by mouth daily at 12 noon.   OXYCODONE-ACETAMINOPHEN (ROXICET) 5-325 MG PER TABLET    Take 1-2 tablets by mouth every 4 (four) hours as needed.   PROBIOTIC PRODUCT (PROBIOTIC PO)    Take 1 scoop by mouth every morning. Probiotic Shake--"Natural Vitality"   PROBIOTIC PRODUCT (PROBIOTIC PO)    Take 1 scoop by mouth every morning. Green [employer]. --shake   RIVAROXABAN (XARELTO) 10 MG TABS TABLET    Take 1 tablet (10 mg total) by mouth daily with supper.  Modified Medications   No medications on file  Discontinued Medications   No medications on file  Physical Exam: Filed Vitals:   08/31/14 0943  BP: 110/70  Pulse: 86  Temp: 97.2 F (36.2 C)  Resp: 16  SpO2: 98%    General- elderly female in no acute distress Head- atraumatic, normocephalic Eyes- no pallor, no icterus, no discharge Neck- no cervical lymphadenopathy Throat- moist mucus membrane Cardiovascular- normal s1,s2, no murmurs, normal dorsalis pedis Respiratory- bilateral clear to auscultation, no wheeze, no rhonchi, no crackles, no use of accessory muscles Abdomen- bowel sounds present, soft, non tender Musculoskeletal- able to move all  4 extremities, limited ROM at both knee joint area, trace edema bilaterally, has mepilex dressing in both knee at surgical site, dressing clean and dry Neurological- no focal deficit Skin- warm and dry Psychiatry- alert and oriented to person, place and time, normal mood and affect    Labs reviewed: Basic Metabolic Panel:  Recent Labs  16/06/9611/10/15 0950 08/26/14 0525  NA 140 141  K 3.7 3.6*  CL 96 102  CO2 32 28  GLUCOSE 79 118*  BUN 13 8  CREATININE 0.53 0.54  CALCIUM 10.1 8.7   CBC:  Recent Labs  08/27/14 0530 08/28/14 0508 08/29/14 0440  WBC 7.8 6.2 5.6  HGB 8.7* 8.3* 7.6*  HCT 25.6* 24.6* 22.3*  MCV 90.5 90.8 89.6  PLT 172 179 198    Assessment/Plan  Knee OA S/p bilateral knee replacement. Will have her work with physical therapy and occupational therapy team to help with gait training and muscle strengthening exercises.fall precautions. Skin care. Encourage to be out of bed. Continue current pain regimen. Add robaxin 500 mg tid x 3 days, then q8h prn for muscle spasm. Continue calcium supplement. Continue xarelto for dvt prophylaxis. Has follow up with dr blackman  muscle spasm Post surgery, add robaxin 500 mg tid x 3 days, then q8h prn  Constipation Stable, continue docusate  Anemia Likely post op from blood loss. Monitor h&h. Continue ferrous sulfate  ADD Continue her home regimen adderall and prozac  Goals of care: short term rehabilitation   Labs/tests ordered: cbc, bmp  Family/ staff Communication: reviewed care plan with patient and nursing supervisor     Oneal GroutMAHIMA Aimee Timmons, MD  [facility] [phone] (Monday-Friday 8 am - 5 pm) [phone] (afterhours)

## 2014-09-05 ENCOUNTER — Other Ambulatory Visit: Payer: Self-pay | Admitting: *Deleted

## 2014-09-05 MED ORDER — OXYCODONE-ACETAMINOPHEN 5-325 MG PO TABS: 1.0000 | ORAL_TABLET | ORAL | Status: DC | PRN

## 2014-09-05 NOTE — Telephone Encounter (Signed)
[facility] 

## 2014-09-07 ENCOUNTER — Non-Acute Institutional Stay (SKILLED_NURSING_FACILITY): Payer: Medicare Other | Admitting: Adult Health

## 2014-09-07 ENCOUNTER — Encounter: Payer: Self-pay | Admitting: Adult Health

## 2014-09-07 DIAGNOSIS — K5900 Constipation, unspecified: Secondary | ICD-10-CM | POA: Diagnosis not present

## 2014-09-07 DIAGNOSIS — D62 Acute posthemorrhagic anemia: Secondary | ICD-10-CM

## 2014-09-07 DIAGNOSIS — Z96653 Presence of artificial knee joint, bilateral: Secondary | ICD-10-CM | POA: Diagnosis not present

## 2014-09-07 DIAGNOSIS — F32A Depression, unspecified: Secondary | ICD-10-CM

## 2014-09-07 DIAGNOSIS — F329 Major depressive disorder, single episode, unspecified: Secondary | ICD-10-CM | POA: Diagnosis not present

## 2014-09-07 DIAGNOSIS — E46 Unspecified protein-calorie malnutrition: Secondary | ICD-10-CM | POA: Diagnosis not present

## 2014-09-07 DIAGNOSIS — M170 Bilateral primary osteoarthritis of knee: Secondary | ICD-10-CM

## 2014-09-07 DIAGNOSIS — M129 Arthropathy, unspecified: Secondary | ICD-10-CM

## 2014-09-07 NOTE — Progress Notes (Signed)
Patient ID: Becky Cunningham, female   DOB: 16-Mar-1951, 63 y.o.   MRN: [id number]   09/07/2014  Facility:  Nursing Home Location:  [name] Room Number: 1108-P LEVEL OF CARE:  SNF (31)   Chief Complaint  Patient presents with  . Discharge Note    Osteoarthritis S/P bilateral knee replacements, anemia, depression, constipation and protein calorie malnutrition    HISTORY OF PRESENT ILLNESS:  This is a 63 year old female who is for discharge home and will have outpatient rehabilitation. DME: Rolling walker . [name] with osteoarthritis status post bilateral knee replacements. She has past medical history of depression. Patient was admitted to this facility for short-term rehabilitation after the patient's recent hospitalization.  Patient has completed SNF rehabilitation and therapy has cleared the patient for discharge.  PAST MEDICAL HISTORY:  Past Medical History  Diagnosis Date  . Depression   . Arthritis     CURRENT MEDICATIONS: Reviewed per MAR/see medication list  Allergies  Allergen Reactions  . Other Other (See Comments)    Sugar enzymes--Throat swelling/scratchy. (when eats something really sweet)     REVIEW OF SYSTEMS:  GENERAL: no change in appetite, no fatigue, no weight changes, no fever, chills or weakness RESPIRATORY: no cough, SOB, DOE, wheezing, hemoptysis CARDIAC: no chest pain, edema or palpitations GI: no abdominal pain, diarrhea, constipation, heart burn, nausea or vomiting  PHYSICAL EXAMINATION  GENERAL: no acute distress, normal body habitus SKIN:  Right lateral knee surgical incisions dry, no erythema EYES: conjunctivae normal, sclerae normal, normal eye lids NECK: supple, trachea midline, no neck masses, no thyroid tenderness, no thyromegaly LYMPHATICS: no LAN in the neck, no supraclavicular LAN RESPIRATORY: breathing is even & unlabored, BS  CTAB CARDIAC: RRR, no murmur,no extra heart sounds, no edema GI: abdomen soft, normal BS, no masses, no tenderness, no hepatomegaly, no splenomegaly EXTREMITIES: Able to move all 4 extremities; ambulates with walker PSYCHIATRIC: the patient is alert & oriented to person, affect & behavior appropriate  LABS/RADIOLOGY: Labs reviewed: Basic Metabolic Panel:  Recent Labs  40/98/1111/06/23 0950 08/26/14 0525  NA 140 141  K 3.7 3.6*  CL 96 102  CO2 32 28  GLUCOSE 79 118*  BUN 13 8  CREATININE 0.53 0.54  CALCIUM 10.1 8.7   CBC:  Recent Labs  08/27/14 0530 08/28/14 0508 08/29/14 0440  WBC 7.8 6.2 5.6  HGB 8.7* 8.3* 7.6*  HCT 25.6* 24.6* 22.3*  MCV 90.5 90.8 89.6  PLT 172 179 198   Dg Knee Left Port  08/25/2014   CLINICAL DATA:  Status post knee replacement  EXAM: PORTABLE LEFT KNEE - 1-2 VIEW  COMPARISON:  None.  FINDINGS: Left knee replacement is noted. Considerable air is noted in the surgical bed. No acute bony abnormality is noted.  IMPRESSION: Status post left knee replacement.   Electronically Signed   By: Alcide CleverMark  Lukens M.D.   On: 08/25/2014 14:29   Dg Knee Right Port  08/25/2014   CLINICAL DATA:  Status post total knee replaced  EXAM: PORTABLE RIGHT KNEE - 1-2 VIEW  COMPARISON:  Jan 10, 2010  FINDINGS: Frontal and lateral views were obtained. Patient is status post total knee replacement with femoral and tibial components appearing well-seated. No fracture or dislocation. Air within the joint is an expected postoperative finding.  IMPRESSION: Prosthetic components appear well seated. No fracture or dislocation.   Electronically Signed   By: Bretta BangWilliam  Woodruff  M.D.   On: 08/25/2014 14:30    ASSESSMENT/PLAN:  Osteoarthritis status post bilateral knee replacements - for outpatient rehabilitation; continue Percocet 5/325 mg 1-2 tabs by mouth every 4 hours when necessary for pain; Robaxin 500 mg by mouth every 8 hours when necessary for muscle spasm; and aspirin 325 mg by mouth daily  for DVT prophylaxis Anemia, acute blood loss - stable; hemoglobin 9.4; continue ferrous sulfate 325 mg by mouth twice a day Depression - mood is stable; continue Prozac 60 mg by mouth daily Constipation - continue Colace 100 mg by mouth twice a day Protein calorie malnutrition - continue protein supplementation    I have filled out patient's discharge paperwork and written prescriptions.  Patient will have outpatient rehabilitation   DME provided:  Rolling walker  Total discharge time: Greater than 30 minutes  Discharge time involved coordination of the discharge process with social worker, nursing staff and therapy department. Medical justification for DME verified     [facility] Center[facility]VARG[facility],MONINA, NP [facility] [phone]

## 2014-09-11 ENCOUNTER — Ambulatory Visit: Payer: Medicare Other | Attending: Orthopaedic Surgery | Admitting: Physical Therapy

## 2014-09-11 ENCOUNTER — Telehealth: Payer: Self-pay | Admitting: *Deleted

## 2014-09-11 ENCOUNTER — Encounter: Payer: Self-pay | Admitting: Physical Therapy

## 2014-09-11 DIAGNOSIS — Z96653 Presence of artificial knee joint, bilateral: Secondary | ICD-10-CM

## 2014-09-11 DIAGNOSIS — R262 Difficulty in walking, not elsewhere classified: Secondary | ICD-10-CM | POA: Diagnosis not present

## 2014-09-11 NOTE — Patient Instructions (Signed)
  Copyright  VHI. All rights reserved.  Heel Slide   Bend knee and pull heel toward buttocks. Hold _20___ seconds. Return. Repeat with other knee. Repeat __3-5__ times. Do ____2 sessions per day. USE A SHEET TO INCREASE STRETCH http://gt2.exer.us/372   Copyright  VHI. All rights reserved.     Raise leg until knee is straight. __10-20_ reps per set, __1_ sets per day, __5-7_ days per week  Copyright  VHI. All rights reserved.    Copyright  VHI. All rights reserved.  Quad Set   Slowly tighten muscles on thigh of straight leg while counting out loud to __5__. Repeat with other leg. Repeat __10__ times. Do __2__ sessions per day.  http://gt2.exer.us/361   Copyright  VHI. All rights reserved.

## 2014-09-11 NOTE — Telephone Encounter (Signed)
APPTS MADE AND PRINTED...TD 

## 2014-09-11 NOTE — Therapy (Signed)
[facility] [address] [place], [place], [address] Phone: [phone]   Fax:  [phone]  Physical Therapy Evaluation  Patient Details  Name: Becky Cunningham MRN: [id number] Date of Birth: 1951/08/09  Encounter Date: 09/11/2014      PT End of Session - 09/11/14 1512    Date for PT Re-Evaluation 11/06/14   PT Start Time 1420   PT Stop Time 1510   PT Time Calculation (min) 50 min   Activity Tolerance Patient tolerated treatment well      Past Medical History  Diagnosis Date  . Depression   . Arthritis     Past Surgical History  Procedure Laterality Date  . Dilation and curettage of uterus    . Knee arthroscopy  2009    left   . Wisdom tooth extraction    . Tmj similar surgery     . Total knee arthroplasty Bilateral 08/25/2014    Procedure: TOTAL KNEE BILATERAL;  Surgeon: Kathryne Hitch, MD;  Location: [facility];  Service: Orthopedics;  Laterality: Bilateral;    There were no vitals taken for this visit.  Visit Diagnosis:  S/P TKR (total knee replacement) using cement, bilateral - Plan: PT plan of care cert/re-cert  Difficulty walking - Plan: PT plan of care cert/re-cert      Subjective Assessment - 09/11/14 1423    Symptoms Decrease in activity level s/p bilateral TKR done on 08/25/14. She went directly to [facility] for 9 days. No HHPT.     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 depending on chair   How long can you stand comfortably? 1-2 hours    How long can you walk comfortably? 1-2 hours   Patient Stated Goals Ready to work and return to full function   Currently in Pain? Yes   Pain Score 3    Pain Location Knee   Pain Orientation Right;Left   Pain Descriptors / Indicators Aching;Tightness   Pain Type Surgical pain   Pain Radiating Towards upper thighs   Pain Onset 1 to 4 weeks ago   Pain Frequency Intermittent   Aggravating Factors  standing, bending   Pain Relieving Factors pain meds    Multiple Pain Sites No          [facility] PT Assessment - 09/11/14 1431    Assessment   Medical Diagnosis B TKR   Onset Date 08/25/14   Next MD Visit 10/09/14   Prior Therapy No   Balance Screen   Has the patient fallen in the past 6 months No   Has the patient had a decrease in activity level because of a fear of falling?  No   Is the patient reluctant to leave their home because of a fear of falling?  No   Home Environment   Living Enviornment Private residence   Living Arrangements Spouse/significant other   Available Help at Discharge Family;Friend(s)   Type of Home Other(Comment)   Home Access Elevator   Additional Comments --  townhome/condo   Observation/Other Assessments   Focus on Therapeutic Outcomes (FOTO)  68 % limited   Sensation   Light Touch Appears Intact   AROM   Right Knee Extension 8  +8   Right Knee Flexion 68  77 AAROM   Left Knee Extension 12   Left Knee Flexion 90  97 AAROM  Strength   Right Knee Flexion 3+/5   Right Knee Extension 4/5   Left Knee Flexion 4/5   Left Knee Extension --  4+/5   Ambulation/Gait   Ambulation/Gait Yes   Ambulation/Gait Assistance 6: Modified independent (Device/Increase time)   Assistive device Straight cane   Gait Pattern Decreased step length - right;Decreased step length - left;Decreased hip/knee flexion - right;Decreased hip/knee flexion - left   Gait velocity decr    Palpation: bilateral knee edema: No pain with palpation, good patellar mobility.  Lt. Knee with marked swelling laterally, hard yet pliable.  Urged patient to elevate LEs and use ice. She states ice makes her ache more, MD said she could use heat at this point.  Lt. Knee incision has a more irregular pattern, small amount of bloody scab along incision.        [facility] Adult PT Treatment/Exercise - 09/11/14 1431     Knee/Hip Exercises: Aerobic   Stationary Bike NuStep level 5, 5 min for ROM   Knee/Hip Exercises: Supine   Quad Sets Strengthening;Both;1 set   Heel Slides AAROM;Right;Left;1 set                PT Education - 09/11/14 1512    Education provided Yes   Education Details PT/POC, HEP    Person(s) Educated Patient   Methods Explanation;Demonstration;Handout   Comprehension Verbalized understanding;Returned demonstration          PT Short Term Goals - 09/11/14 1516    PT SHORT TERM GOAL #1   Title Patient will be I with initial HEP for AAROM and strength   Time 4   Period Weeks   Status New   PT SHORT TERM GOAL #2   Title Pt. will achieve AROM in Bilat. knees to 90 deg in sitting with min difficulty   Baseline dec on Rt>Lt.    Time 4   Period Weeks   Status New   PT SHORT TERM GOAL #3   Title Pt will achieve +5-8 deg extension in bilateral knees (Rt, Lt respectively) for improved gait   Baseline dec on Rt. >Lt.    Time 4   Period Weeks   Status New   PT SHORT TERM GOAL #4   Title Pt will report full Independence with gait and ADLS in the home with min modification for knee ROM   Period Weeks   Status New   PT SHORT TERM GOAL #5   Title Pt. will negotiate 12 stairs in the community reciprocally and min increase in pain using hand rails.    Time 4   Period Weeks   Status New           PT Long Term Goals - 09/11/14 1521    PT LONG TERM GOAL #1   Title Pt. will demo and understand body mechanics, RICE as it pertains to activity, work.     Time 8   Period Weeks   Status New   PT LONG TERM GOAL #2   Title Pt. will be able to stand for 1 hour continuously with min pain in knees at work   Time 8   Period Weeks   Status New   PT LONG TERM GOAL #3   Title Pt. will work a "full day" and report min occasional pain in knees.    Time 8   Period Weeks   Status New   PT LONG TERM GOAL #4   Title Pt will demo knee AROM flexion Rt.  to 100 deg and Lt. 110 deg. for  overall improved functional mobility   Time 8   Period Weeks   Status New   PT LONG TERM GOAL #5   Title Pt. will demo 4+/5 or greater strength in LEs (hip and knees) for full function at work   Time 8   Period Weeks   Status New   Additional Long Term Goals   Additional Long Term Goals Yes   PT LONG TERM GOAL #6   Title Pt will complete FOTO at 48% limited or less to demo improved function.                Plan - 15-Sep-2014 1513    Clinical Impression Statement This patient will benefit from skilled PT to improve deficits in function related to surgery.  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     Pt will benefit from skilled therapeutic intervention in order to improve on the following deficits Abnormal gait;Decreased activity tolerance;Decreased mobility;Decreased strength;Pain;Increased fascial restricitons;Decreased skin integrity;Decreased endurance;Increased edema;Impaired sensation;Impaired flexibility;Difficulty walking;Decreased range of motion   Rehab Potential Excellent   PT Frequency 3x / week  2-3 x per week   PT Duration 8 weeks   PT Treatment/Interventions ADLs/Self Care Home Management;Cryotherapy;Electrical Stimulation;Functional mobility training;Neuromuscular re-education;Stair training;Ultrasound;Gait training;Balance training;Manual techniques;Passive range of motion;Other (comment);Therapeutic exercise;DME Instruction;Moist Heat;Therapeutic activities;Patient/family education;Scar mobilization  Vasopneumatic for edema   PT Next Visit Plan review level 1-2 ex.  and address swelling   PT Home Exercise Plan LAQ, AAROM and quad set   Consulted and Agree with Plan of Care Patient          G-Codes - 2014-09-15 1527    Functional Assessment Tool Used FOTO   Functional Limitation Mobility: Walking and moving around   Mobility: Walking and Moving Around Current Status [id number]) At least 60 percent but less than 80 percent impaired,  limited or restricted   Mobility: Walking and Moving Around Goal Status (U9811) At least 40 percent but less than 60 percent impaired, limited or restricted       Problem List Patient Active Problem List   Diagnosis Date Noted  . Primary osteoarthritis of both knees 08/25/2014  . Status post bilateral knee replacements 08/25/2014    PAA,JENNIFER 09-15-14, 3:31 PM  [facility] [address] [place], [place], [address] Phone: [phone]   Fax:  [phone]  Karie Mainland, PT 09-15-14 3:34 PM Phone: [phone] Fax: [phone]

## 2014-09-19 ENCOUNTER — Ambulatory Visit: Payer: Medicare Other | Admitting: Physical Therapy

## 2014-09-19 DIAGNOSIS — Z96653 Presence of artificial knee joint, bilateral: Secondary | ICD-10-CM | POA: Diagnosis not present

## 2014-09-19 DIAGNOSIS — R262 Difficulty in walking, not elsewhere classified: Secondary | ICD-10-CM | POA: Diagnosis not present

## 2014-09-19 NOTE — Patient Instructions (Signed)
Self care for edema.

## 2014-09-19 NOTE — Therapy (Signed)
[facility] [address] [place], [place], [address] Phone: [phone]   Fax:  [phone]  Physical Therapy Treatment  Patient Details  Name: Becky Cunningham MRN: [id number] Date of Birth: 1951-03-21 Referring Provider:  Frederich ChickWebb, Carol D, MD  Encounter Date: 09/19/2014      PT End of Session - 09/19/14 0949    Visit Number 2   Number of Visits 24   Date for PT Re-Evaluation 11/06/14   PT Start Time 0850   PT Stop Time 0930   PT Time Calculation (min) 40 min   Activity Tolerance Patient limited by pain;Patient tolerated treatment well      Past Medical History  Diagnosis Date  . Depression   . Arthritis     Past Surgical History  Procedure Laterality Date  . Dilation and curettage of uterus    . Knee arthroscopy  2009    left   . Wisdom tooth extraction    . Tmj similar surgery     . Total knee arthroplasty Bilateral 08/25/2014    Procedure: TOTAL KNEE BILATERAL;  Surgeon: Kathryne Hitchhristopher Y Blackman, MD;  Location: [facility];  Service: Orthopedics;  Laterality: Bilateral;    There were no vitals taken for this visit.  Visit Diagnosis:  S/P TKR (total knee replacement) using cement, bilateral      Subjective Assessment - 09/19/14 0936    Symptoms 3/10 using pain meds every 4 hours, up to 7/10 yesterday.  Swelling worse as day goes on.  Exercises about 50 minutes a day including walking, uses cane in community.   Currently in Pain? Yes   Pain Score 3    Pain Location Knee   Pain Orientation Right;Left  Lt>Rt   Pain Descriptors / Indicators Aching  tightness   Pain Type Surgical pain   Aggravating Factors  standing too long, getting up from sitting   Pain Relieving Factors pain medications   Effect of Pain on Daily Activities limits time at work   Multiple Pain Sites No                    [facility] Adult PT Treatment/Exercise - 09/19/14 0850    Knee/Hip Exercises: Standing   Knee Flexion Both  active  sitting   Forward Step Up Both;10 reps   Functional Squat 10 reps  cues for technique, it hurts and feels good at the same time   Modalities   Modalities --  declined, pain improved, reports walking easier after sessio   Manual Therapy   Edema Management retrograde bilateral thighs, knees followed by kinesiotex taping for edema and scar tissue management, Lt scat tissue > Rt                PT Education - 09/19/14 0948    Education provided Yes   Education Details Retrograde soft tissue work for edema to thigh and knee.  Do not do for calf areas.   Person(s) Educated Patient   Methods Explanation;Demonstration;Verbal cues   Comprehension Verbalized understanding          PT Short Term Goals - 09/19/14 0954    PT SHORT TERM GOAL #1   Title Patient will be I with initial HEP for AAROM and strength   Time 4   Period Weeks   Status Unable to assess   PT SHORT TERM GOAL #2   Title Pt. will achieve AROM in Bilat. knees to 90 deg in sitting with min difficulty  Time 4   Period Weeks   Status On-going   PT SHORT TERM GOAL #3   Title Pt will achieve +5-8 deg extension in bilateral knees (Rt, Lt respectively) for improved gait   Time 4   Period Weeks   Status On-going   PT SHORT TERM GOAL #4   Title Pt will report full [place] with gait and ADLS in the home with min modification for knee ROM   Status On-going           PT Long Term Goals - 09/11/14 1521    PT LONG TERM GOAL #1   Title Pt. will demo and understand body mechanics, RICE as it pertains to activity, work.     Time 8   Period Weeks   Status New   PT LONG TERM GOAL #2   Title Pt. will be able to stand for 1 hour continuously with min pain in knees at work   Time 8   Period Weeks   Status New   PT LONG TERM GOAL #3   Title Pt. will work a "full day" and report min occasional pain in knees.    Time 8   Period Weeks   Status New   PT LONG TERM GOAL #4   Title Pt will demo knee AROM flexion  Rt. to 100 deg and Lt. 110 deg. for overall improved functional mobility   Time 8   Period Weeks   Status New   PT LONG TERM GOAL #5   Title Pt. will demo 4+/5 or greater strength in LEs (hip and knees) for full function at work   Time 8   Period Weeks   Status New   Additional Long Term Goals   Additional Long Term Goals Yes   PT LONG TERM GOAL #6   Title Pt will complete FOTO at 48% limited or less to demo improved function.                Plan - 09/19/14 0952    Clinical Impression Statement Focus today on stiffness manually.  Patient attentive and participating actively and should be ready for stretches next visit.   PT Next Visit Plan review level 1-2 ex.  stretches with strap mobilization. manual as needed, assess taping.   Consulted and Agree with Plan of Care Patient        Problem List Patient Active Problem List   Diagnosis Date Noted  . Primary osteoarthritis of both knees 08/25/2014  . Status post bilateral knee replacements 08/25/2014   Liz Beach, PTA 09/19/2014 10:00 AM Phone: [phone] Fax: [phone]  [facility] 09/19/2014, 10:00 AM  [facility] [address] [place], [place], [address] Phone: [phone]   Fax:  [phone]

## 2014-09-21 ENCOUNTER — Ambulatory Visit: Payer: Medicare Other | Admitting: Physical Therapy

## 2014-09-21 DIAGNOSIS — R262 Difficulty in walking, not elsewhere classified: Secondary | ICD-10-CM | POA: Diagnosis not present

## 2014-09-21 DIAGNOSIS — Z96653 Presence of artificial knee joint, bilateral: Secondary | ICD-10-CM

## 2014-09-21 NOTE — Patient Instructions (Signed)
Prone Heel-to-Buttocks Stretch   Lying on stomach, bring heel of one foot toward buttocks while exhaling. Repeat with other leg. Repeat 10____ times. Do _2-3___ sessions per day.Hold 10 seconds   http://gt2.exer.us/268   Copyright  VHI. All rights reserved.

## 2014-09-21 NOTE — Therapy (Signed)
[facility] [address] [place], [place], [address] Phone: [phone]   Fax:  [phone]  Physical Therapy Treatment  Patient Details  Name: Becky Cunningham MRN: [id number] Date of Birth: 1951-06-10 Referring Provider:  Frederich ChickWebb, Carol D, MD  Encounter Date: 09/21/2014      PT End of Session - 09/21/14 0802    Visit Number 3   Number of Visits 24   Date for PT Re-Evaluation 11/06/14   PT Start Time 0730   PT Stop Time 0800   PT Time Calculation (min) 30 min   Activity Tolerance Patient tolerated treatment well;Patient limited by pain      Past Medical History  Diagnosis Date  . Depression   . Arthritis     Past Surgical History  Procedure Laterality Date  . Dilation and curettage of uterus    . Knee arthroscopy  2009    left   . Wisdom tooth extraction    . Tmj similar surgery     . Total knee arthroplasty Bilateral 08/25/2014    Procedure: TOTAL KNEE BILATERAL;  Surgeon: Kathryne Hitchhristopher Y Blackman, MD;  Location: [facility];  Service: Orthopedics;  Laterality: Bilateral;    There were no vitals taken for this visit.  Visit Diagnosis:  S/P TKR (total knee replacement) using cement, bilateral      Subjective Assessment - 09/21/14 0741    Symptoms Slept better last night, Tape worked wonders.  Pre medicated for pain                    [facility] - 09/21/14 0742    Knee/Hip Exercises: Stretches   Quad Stretch 10 seconds  !0reps each with strap added to home exercise   Knee: Self-Stretch to increase Flexion 10 seconds  10 reps with foot on step, each   Knee/Hip Exercises: Aerobic   Stationary Bike 4 minutes prior to getting stiff.   Manual Therapy   Manual Therapy --  Strap mobilization  for flexion bilateral                PT Education - 09/21/14 0800    Education provided Yes   Education Details Prone knee flexion          PT Short Term Goals - 09/19/14 0954    PT SHORT TERM GOAL #1   Title Patient will be I with initial HEP for AAROM and strength   Time 4   Period Weeks   Status Unable to assess   PT SHORT TERM GOAL #2   Title Pt. will achieve AROM in Bilat. knees to 90 deg in sitting with min difficulty   Time 4   Period Weeks   Status On-going   PT SHORT TERM GOAL #3   Title Pt will achieve +5-8 deg extension in bilateral knees (Rt, Lt respectively) for improved gait   Time 4   Period Weeks   Status On-going   PT SHORT TERM GOAL #4   Title Pt will report full [place] with gait and ADLS in the home with min modification for knee ROM   Status On-going           PT Long Term Goals - 09/11/14 1521    PT LONG TERM GOAL #1   Title Pt. will demo and understand body mechanics, RICE as it pertains to activity, work.     Time 8   Period Weeks   Status New   PT LONG  TERM GOAL #2   Title Pt. will be able to stand for 1 hour continuously with min pain in knees at work   Time 8   Period Weeks   Status New   PT LONG TERM GOAL #3   Title Pt. will work a "full day" and report min occasional pain in knees.    Time 8   Period Weeks   Status New   PT LONG TERM GOAL #4   Title Pt will demo knee AROM flexion Rt. to 100 deg and Lt. 110 deg. for overall improved functional mobility   Time 8   Period Weeks   Status New   PT LONG TERM GOAL #5   Title Pt. will demo 4+/5 or greater strength in LEs (hip and knees) for full function at work   Time 8   Period Weeks   Status New   Additional Long Term Goals   Additional Long Term Goals Yes   PT LONG TERM GOAL #6   Title Pt will complete FOTO at 48% limited or less to demo improved function.                Plan - 09/21/14 0802    Clinical Impression Statement Focus manual and exercise for stretching.  3/10 pain after session.  Declines cold after.   PT Next Visit Plan Measure.  More closed chain, Range, Replace tape?        Problem List Patient Active Problem List    Diagnosis Date Noted  . Primary osteoarthritis of both knees 08/25/2014  . Status post bilateral knee replacements 08/25/2014   Liz Beach, PTA 09/21/2014 8:05 AM Phone: [phone] Fax: [phone]  [facility] 09/21/2014, 8:05 AM  [facility] [address] [place], [place], [address] Phone: [phone]   Fax:  [phone]

## 2014-09-22 ENCOUNTER — Ambulatory Visit: Payer: Medicare Other | Admitting: Physical Therapy

## 2014-09-22 DIAGNOSIS — Z96653 Presence of artificial knee joint, bilateral: Secondary | ICD-10-CM | POA: Diagnosis not present

## 2014-09-22 DIAGNOSIS — R262 Difficulty in walking, not elsewhere classified: Secondary | ICD-10-CM | POA: Diagnosis not present

## 2014-09-22 NOTE — Therapy (Signed)
[facility] [address] [place], [place], [address] Phone: [phone]   Fax:  [phone]  Physical Therapy Treatment  Patient Details  Name: Becky Cunningham MRN: [id number] Date of Birth: [birth year] Referring Provider:  Frederich ChickWebb, Carol D, MD  Encounter Date: 09/22/2014      PT End of Session - 09/22/14 0932    Visit Number 4   Number of Visits 24   Date for PT Re-Evaluation 11/06/14   PT Start Time 0848   PT Stop Time 0930   PT Time Calculation (min) 42 min      Past Medical History  Diagnosis Date  . Depression   . Arthritis     Past Surgical History  Procedure Laterality Date  . Dilation and curettage of uterus    . Knee arthroscopy  2009    left   . Wisdom tooth extraction    . Tmj similar surgery     . Total knee arthroplasty Bilateral 08/25/2014    Procedure: TOTAL KNEE BILATERAL;  Surgeon: Kathryne Hitchhristopher Y Blackman, MD;  Location: [facility];  Service: Orthopedics;  Laterality: Bilateral;    There were no vitals taken for this visit.  Visit Diagnosis:  S/P TKR (total knee replacement) using cement, bilateral  Difficulty walking      Subjective Assessment - 09/22/14 0855    Symptoms I was really sore last night.  Better today.    Currently in Pain? Yes   Pain Score 2    Pain Location Knee   Pain Orientation Right;Left   Pain Descriptors / Indicators Aching;Sore   Pain Type Surgical pain          [facility] Adult PT Treatment/Exercise - 09/22/14 0902    Knee/Hip Exercises: Stretches   Quad Stretch 3 reps;30 seconds   Knee: Self-Stretch to increase Flexion 3 reps;30 seconds   Knee/Hip Exercises: Aerobic   Stationary Bike --  5 min no tension on bike   Knee/Hip Exercises: Supine   Bridges Both;1 set   Bridges Limitations --  feet on ball   Straight Leg Raises Both;1 set;20 reps   Other Supine Knee Exercises knee/hip flexion on ball x 20   Other Supine Knee Exercises bent knee bridge x 10   Knee/Hip  Exercises: Prone   Hamstring Curl 1 set;15 reps   Hip Extension Strengthening;Both;10 reps   Contract/Relax to Increase Flexion --  x2 each LE self mob   Manual Therapy   Edema Management K tape for edema with fans for edema bilateral.            PT Education - 09/22/14 0932    Education provided No          PT Short Term Goals - 09/19/14 0954    PT SHORT TERM GOAL #1   Title Patient will be I with initial HEP for AAROM and strength   Time 4   Period Weeks   Status Unable to assess   PT SHORT TERM GOAL #2   Title Pt. will achieve AROM in Bilat. knees to 90 deg in sitting with min difficulty   Time 4   Period Weeks   Status On-going   PT SHORT TERM GOAL #3   Title Pt will achieve +5-8 deg extension in bilateral knees (Rt, Lt respectively) for improved gait   Time 4   Period Weeks   Status On-going   PT SHORT TERM GOAL #4   Title Pt will report full [place] with gait and  ADLS in the home with min modification for knee ROM   Status On-going           PT Long Term Goals - 09/11/14 1521    PT LONG TERM GOAL #1   Title Pt. will demo and understand body mechanics, RICE as it pertains to activity, work.     Time 8   Period Weeks   Status New   PT LONG TERM GOAL #2   Title Pt. will be able to stand for 1 hour continuously with min pain in knees at work   Time 8   Period Weeks   Status New   PT LONG TERM GOAL #3   Title Pt. will work a "full day" and report min occasional pain in knees.    Time 8   Period Weeks   Status New   PT LONG TERM GOAL #4   Title Pt will demo knee AROM flexion Rt. to 100 deg and Lt. 110 deg. for overall improved functional mobility   Time 8   Period Weeks   Status New   PT LONG TERM GOAL #5   Title Pt. will demo 4+/5 or greater strength in LEs (hip and knees) for full function at work   Time 8   Period Weeks   Status New   Additional Long Term Goals   Additional Long Term Goals Yes   PT LONG TERM GOAL #6   Title Pt will  complete FOTO at 48% limited or less to demo improved function.            Plan - 09/22/14 0932    Clinical Impression Statement Tolerates activity and stretching well, does HEP 1 time per day.     PT Next Visit Plan Measure.  More closed chain, Range and try vaso at the end   PT Home Exercise Plan advance to CKC (mini squat, TKE at wall)   Consulted and Agree with Plan of Care Patient        Problem List Patient Active Problem List   Diagnosis Date Noted  . Primary osteoarthritis of both knees 08/25/2014  . Status post bilateral knee replacements 08/25/2014    Becky Cunningham 09/22/2014, 9:35 AM  [facility] [address] [place], [place], [address] Phone: [phone]   Fax:  [phone]   Karie Mainland, PT 09/22/2014 9:39 AM Phone: [phone] Fax: [phone]

## 2014-09-25 ENCOUNTER — Ambulatory Visit: Payer: Medicare Other | Admitting: Physical Therapy

## 2014-09-25 DIAGNOSIS — Z96653 Presence of artificial knee joint, bilateral: Secondary | ICD-10-CM

## 2014-09-25 DIAGNOSIS — R262 Difficulty in walking, not elsewhere classified: Secondary | ICD-10-CM

## 2014-09-25 NOTE — Patient Instructions (Signed)
Body-Weight Step-Up: Stable (Active)   Head up, back flat, place one foot on step. Bring other leg up on to step. Complete __1-2_ sets of __10-20_ repetitions. Perform _1-2__ sessions per day.  http://gtsc.exer.us/512   Copyright  VHI. All rights reserved.  Functional Quadriceps: Sit to Stand   Sit on edge of chair, feet flat on floor. Stand upright, extending knees fully. Repeat _10___ times per set. Do _1-2___ sets per session. Do _1-2__ sessions per day.  http://orth.exer.us/734   Copyright  VHI. All rights reserved.  Strengthening: Wall Slide   Leaning on wall, slowly lower buttocks until thighs are parallel to floor. Hold __5-10__ seconds. Tighten thigh muscles and return. Repeat __10__ times per set. Do __1-2_ sets per session. Do _1-2___ sessions per day.  http://orth.exer.us/630   Copyright  VHI. All rights reserved.

## 2014-09-25 NOTE — Therapy (Signed)
[facility] [address] [place], [place], [address] Phone: [phone]   Fax:  [phone]  Physical Therapy Treatment  Patient Details  Name: ARHIANNA EBEY MRN: [id number] Date of Birth: 07-31-1951 Referring Provider:  Frederich Chick, MD  Encounter Date: 09/25/2014      PT End of Session - 09/25/14 0859    Visit Number 5   Number of Visits 24   Date for PT Re-Evaluation 11/06/14   PT Start Time 0846      Past Medical History  Diagnosis Date  . Depression   . Arthritis     Past Surgical History  Procedure Laterality Date  . Dilation and curettage of uterus    . Knee arthroscopy  2009    left   . Wisdom tooth extraction    . Tmj similar surgery     . Total knee arthroplasty Bilateral 08/25/2014    Procedure: TOTAL KNEE BILATERAL;  Surgeon: Kathryne Hitch, MD;  Location: [facility];  Service: Orthopedics;  Laterality: Bilateral;    There were no vitals taken for this visit.  Visit Diagnosis:  S/P TKR (total knee replacement) using cement, bilateral  Difficulty walking      Subjective Assessment - 09/25/14 0851    Symptoms Feeling good today, I'm easing off of the medicine.  I was able to get on my knees on the bed this weekend.  Tape still on.     Currently in Pain? Yes   Pain Score 3    Pain Orientation Right;Left  R>L          [facility] PT Assessment - 09/25/14 1043    AROM   Right Knee Extension 12  LAQ   Right Knee Flexion 95  AAROM seated   Left Knee Extension 10   Left Knee Flexion 104  AAROM   Strength   Right Knee Flexion 4/5   Right Knee Extension 4/5   Left Knee Flexion 4+/5   Left Knee Extension 4+/5           [facility] Adult PT Treatment/Exercise - 09/25/14 1042    Knee/Hip Exercises: Standing   Wall Squat 1 set;10 reps  HEP   Other Standing Knee Exercises sit to stand x 10 for HEP     Pilates Reformer used for LE/core strength, postural strength, lumbopelvic disassociation and  core control.  Exercises included: modified for decr knee ROM Footwork 2 Red 1 Blue, double leg, parallel and V, single leg (2 Red) Bridging all springs with ball for LE alignment Feet in Straps 1 red: Arcs, Circles (parallel and V) Min cueing needed to maintain pelvis in neutral (post preference)      PT Education - 09/25/14 0929    Education provided Yes   Education Details HEP and plan of care   Person(s) Educated Patient   Methods Explanation;Handout;Demonstration   Comprehension Verbalized understanding;Returned demonstration          PT Short Term Goals - 09/25/14 0932    PT SHORT TERM GOAL #1   Title Patient will be I with initial HEP for AAROM and strength   Status Achieved   PT SHORT TERM GOAL #2   Title Pt. will achieve AROM in Bilat. knees to 90 deg in sitting with min difficulty   Status Achieved   PT SHORT TERM GOAL #3   Title Pt will achieve +5-8 deg extension in bilateral knees (Rt, Lt respectively) for improved gait   Status On-going  PT SHORT TERM GOAL #4   Title Pt will report full Independence with gait and ADLS in the home with min modification for knee ROM   Status On-going   PT SHORT TERM GOAL #5   Title Pt. will negotiate 12 stairs in the community reciprocally and min increase in pain using hand rails.    Status On-going           PT Long Term Goals - 09/25/14 0930    PT LONG TERM GOAL #1   Title Pt. will demo and understand body mechanics, RICE as it pertains to activity, work.     Status On-going   PT LONG TERM GOAL #2   Title Pt. will be able to stand for 1 hour continuously with min pain in knees at work   Status On-going   PT LONG TERM GOAL #3   Title Pt. will work a "full day" and report min occasional pain in knees.    Status On-going   PT LONG TERM GOAL #4   Title Pt will demo knee AROM flexion Rt. to 100 deg and Lt. 110 deg. for overall improved functional mobility   Status On-going   PT LONG TERM GOAL #5   Title Pt. will demo  4+/5 or greater strength in LEs (hip and knees) for full function at work   Status On-going               Plan - 09/25/14 0859    Clinical Impression Statement Patient was able to tolerate Pilates Reformer for level 1 basic LE ex. She was able to demo and understand principles but did exhibit mild difficulty with core control. ROM has improved bilaterally in flexion, see goals.    PT Frequency 3x / week   PT Next Visit Plan continue with CKC and strength/ROM bilaterally. VASO? Retape?   PT Home Exercise Plan gave sit to stad and wall squat for HEP   Consulted and Agree with Plan of Care Patient        Problem List Patient Active Problem List   Diagnosis Date Noted  . Primary osteoarthritis of both knees 08/25/2014  . Status post bilateral knee replacements 08/25/2014    Ryver Zadrozny 09/25/2014, 10:51 AM  [facility] [address] [place], [place], [address] Phone: [phone]   Fax:  [phone]

## 2014-09-27 ENCOUNTER — Ambulatory Visit: Payer: Medicare Other | Admitting: Rehabilitation

## 2014-09-27 DIAGNOSIS — R262 Difficulty in walking, not elsewhere classified: Secondary | ICD-10-CM

## 2014-09-27 DIAGNOSIS — Z96653 Presence of artificial knee joint, bilateral: Secondary | ICD-10-CM

## 2014-09-27 NOTE — Therapy (Signed)
[facility] [address] [place], [place], [address] Phone: [phone]   Fax:  [phone]  Physical Therapy Treatment  Patient Details  Name: Becky Cunningham MRN: [id number] Date of Birth: Jan 25, 1951 Referring Provider:  Frederich ChickWebb, Carol D, MD  Encounter Date: 09/27/2014      PT End of Session - 09/27/14 0959    Visit Number 6   Number of Visits 24   Date for PT Re-Evaluation 11/06/14   PT Start Time 0850   PT Stop Time 0945   PT Time Calculation (min) 55 min      Past Medical History  Diagnosis Date  . Depression   . Arthritis     Past Surgical History  Procedure Laterality Date  . Dilation and curettage of uterus    . Knee arthroscopy  2009    left   . Wisdom tooth extraction    . Tmj similar surgery     . Total knee arthroplasty Bilateral 08/25/2014    Procedure: TOTAL KNEE BILATERAL;  Surgeon: Kathryne Hitchhristopher Y Blackman, MD;  Location: [facility];  Service: Orthopedics;  Laterality: Bilateral;    There were no vitals taken for this visit.  Visit Diagnosis:  S/P TKR (total knee replacement) using cement, bilateral  Difficulty walking        [facility] - 09/27/14 0920    AROM   Right Knee Extension 10   Right Knee Flexion 102  AROM supine   Left Knee Extension 7   Left Knee Flexion 110  AROM supine                  [facility] Adult PT Treatment/Exercise - 09/27/14 0955    Ambulation/Gait   Stairs Yes   Stairs Assistance 7: Independent   Stair Management Technique Alternating pattern   Number of Stairs 12   Height of Stairs 6   Knee/Hip Exercises: Aerobic   Stationary Bike Rec bike level 1 x 5 min and Nustep L5 x 6 min   Knee/Hip Exercises: Machines for Strengthening   Cybex Leg Press 1 plate bil x 30, single leg x 15 each    Knee/Hip Exercises: Standing   Lateral Step Up 20 reps;Step Height: 4";Hand Hold: 1   Forward Step Up 20 reps;Step Height: 6";Hand Hold: 1   Modalities   Modalities  Cryotherapy   Cryotherapy   Number Minutes Cryotherapy 15 Minutes   Cryotherapy Location Knee   Type of Cryotherapy Ice pack;Other (comment)  Vaso RLE mod pressure, ice pack LLE                  PT Short Term Goals - 09/25/14 0932    PT SHORT TERM GOAL #1   Title Patient will be I with initial HEP for AAROM and strength   Status Achieved   PT SHORT TERM GOAL #2   Title Pt. will achieve AROM in Bilat. knees to 90 deg in sitting with min difficulty   Status Achieved   PT SHORT TERM GOAL #3   Title Pt will achieve +5-8 deg extension in bilateral knees (Rt, Lt respectively) for improved gait   Status On-going   PT SHORT TERM GOAL #4   Title Pt will report full Independence with gait and ADLS in the home with min modification for knee ROM   Status On-going   PT SHORT TERM GOAL #5   Title Pt. will negotiate 12 stairs in the community reciprocally and min increase in pain using  hand rails.    Status On-going           PT Long Term Goals - 09/25/14 0930    PT LONG TERM GOAL #1   Title Pt. will demo and understand body mechanics, RICE as it pertains to activity, work.     Status On-going   PT LONG TERM GOAL #2   Title Pt. will be able to stand for 1 hour continuously with min pain in knees at work   Status On-going   PT LONG TERM GOAL #3   Title Pt. will work a "full day" and report min occasional pain in knees.    Status On-going   PT LONG TERM GOAL #4   Title Pt will demo knee AROM flexion Rt. to 100 deg and Lt. 110 deg. for overall improved functional mobility   Status On-going   PT LONG TERM GOAL #5   Title Pt. will demo 4+/5 or greater strength in LEs (hip and knees) for full function at work   Status On-going               Plan - 09/27/14 1002    Clinical Impression Statement Pt demonstrates safe reciprocal stairs in clinic with 1 HR. AROM improved post treatment.    PT Next Visit Plan continue with CKC and strength/ROM bilaterally, cont vaso         Problem List Patient Active Problem List   Diagnosis Date Noted  . Primary osteoarthritis of both knees 08/25/2014  . Status post bilateral knee replacements 08/25/2014    Sherrie Mustache, [place] 09/27/2014, 10:05 AM  [facility] [address] [place], [place], [address] Phone: [phone]   Fax:  [phone]

## 2014-09-28 ENCOUNTER — Telehealth: Payer: Self-pay | Admitting: *Deleted

## 2014-09-28 NOTE — Telephone Encounter (Signed)
sw pt and she made me aware that we will be closed due to the weather, and stated that she will see us next week...td

## 2014-09-29 ENCOUNTER — Ambulatory Visit: Payer: Medicare Other | Admitting: Physical Therapy

## 2014-10-02 ENCOUNTER — Ambulatory Visit: Payer: Medicare Other | Admitting: Physical Therapy

## 2014-10-04 ENCOUNTER — Ambulatory Visit: Payer: Medicare Other | Admitting: Physical Therapy

## 2014-10-04 DIAGNOSIS — R262 Difficulty in walking, not elsewhere classified: Secondary | ICD-10-CM | POA: Diagnosis not present

## 2014-10-04 DIAGNOSIS — Z96653 Presence of artificial knee joint, bilateral: Secondary | ICD-10-CM

## 2014-10-04 NOTE — Therapy (Signed)
[facility] [address] [place], [place], [address] Phone: [phone]   Fax:  [phone]  Physical Therapy Treatment  Patient Details  Name: Becky Cunningham MRN: [id number] Date of Birth: 10/03/1950 Referring Provider:  Kathryne Hitch*  Encounter Date: 10/04/2014      PT End of Session - 10/04/14 0946    Visit Number 7   Number of Visits 24   Date for PT Re-Evaluation 11/06/14   PT Start Time 0850   PT Stop Time 0935   PT Time Calculation (min) 45 min   Activity Tolerance Patient tolerated treatment well;Patient limited by pain      Past Medical History  Diagnosis Date  . Depression   . Arthritis     Past Surgical History  Procedure Laterality Date  . Dilation and curettage of uterus    . Knee arthroscopy  2009    left   . Wisdom tooth extraction    . Tmj similar surgery     . Total knee arthroplasty Bilateral 08/25/2014    Procedure: TOTAL KNEE BILATERAL;  Surgeon: Kathryne Hitch, MD;  Location: [facility];  Service: Orthopedics;  Laterality: Bilateral;    There were no vitals taken for this visit.  Visit Diagnosis:  S/P TKR (total knee replacement) using cement, bilateral      Subjective Assessment - 10/04/14 0851    Symptoms Missed 2 visits due to the weather so I did my own PT and walked up and down the stairs.  Sleeping better, less medication Gets spasms in back later in the day with gettine up  from chair and  chair or moving the wrong way.  tries to use good body mechanics.   Currently in Pain? No/denies   Pain Location Back   Pain Descriptors / Indicators Spasm   Pain Onset 1 to 4 weeks ago   Pain Frequency Intermittent   Multiple Pain Sites No                    [facility] Adult PT Treatment/Exercise - 10/04/14 0908    Knee/Hip Exercises: Aerobic   Stationary Bike 5 minutes   Knee/Hip Exercises: Machines for Strengthening   Cybex Knee Extension 10 reps Assist extension, 10  second hold the slow lowering.   Cybex Knee Flexion 1 plate both 10 reps   Knee/Hip Exercises: Standing   Heel Raises 1 set  each single leg   Lateral Step Up 20 reps;Both   Forward Step Up Both   Step Down 5 reps;2 sets;Both   Wall Squat 10 reps   Manual Therapy   Manual Therapy Passive ROM;Joint mobilization  for both extension and flexion, patellar mobilizations, scar   Joint Mobilization yes   Passive ROM yes                PT Education - 10/04/14 0946    Education provided Yes   Education Details scar tissue mobilization technique   Person(s) Educated Patient   Methods Explanation;Demonstration   Comprehension Verbalized understanding;Returned demonstration          PT Short Term Goals - 10/04/14 0949    PT SHORT TERM GOAL #4   Title Pt will report full Independence with gait and ADLS in the home with min modification for knee ROM   Period Weeks   Status Achieved           PT Long Term Goals - 10/04/14 0949    PT LONG TERM  GOAL #1   Title Pt. will demo and understand body mechanics, RICE as it pertains to activity, work.     Time 8   Period Weeks   Status On-going   PT LONG TERM GOAL #2   Title Pt. will be able to stand for 1 hour continuously with min pain in knees at work   Time 8   Period Weeks   Status On-going   PT LONG TERM GOAL #3   Title Pt. will work a "full day" and report min occasional pain in knees.    Time 8   Period Weeks   Status On-going   PT LONG TERM GOAL #4   Title Pt will demo knee AROM flexion Rt. to 100 deg and Lt. 110 deg. for overall improved functional mobility   Time 8   Period Weeks   Status Partially Met   PT LONG TERM GOAL #5   Title Pt. will demo 4+/5 or greater strength in LEs (hip and knees) for full function at work   Time 8   Period Weeks   Status Unable to assess               Plan - 10/04/14 0947    Clinical Impression Statement 110 active assist flexion. focus on Range and strengthening,   Met goal for stairs.   PT Next Visit Plan continue with CKC and strength/ROM bilaterally, cont vaso  See's Md on Monday        Problem List Patient Active Problem List   Diagnosis Date Noted  . Primary osteoarthritis of both knees 08/25/2014  . Status post bilateral knee replacements 08/25/2014  Melvenia Needles, PTA 10/04/2014 12:14 PM Phone: [phone] Fax: [phone]   [facility] 10/04/2014, 12:14 PM  [place], [place], [address] Phone: [phone]   Fax:  [phone]

## 2014-10-06 ENCOUNTER — Ambulatory Visit: Payer: Medicare Other | Admitting: Physical Therapy

## 2014-10-06 DIAGNOSIS — Z96653 Presence of artificial knee joint, bilateral: Secondary | ICD-10-CM | POA: Diagnosis not present

## 2014-10-06 DIAGNOSIS — R262 Difficulty in walking, not elsewhere classified: Secondary | ICD-10-CM | POA: Diagnosis not present

## 2014-10-06 NOTE — Therapy (Signed)
[place], [place], [address] Phone: [phone]   Fax:  [phone]  Physical Therapy Treatment  Patient Details  Name: Becky Cunningham MRN: [id number] Date of Birth: 03/07/1951 Referring Provider:  Jonathon Bellows, MD  Encounter Date: 10/06/2014      PT End of Session - 10/06/14 0930    Visit Number 8   Number of Visits 24   Date for PT Re-Evaluation 11/06/14   PT Start Time 4034   PT Stop Time 0929   PT Time Calculation (min) 45 min      Past Medical History  Diagnosis Date  . Depression   . Arthritis     Past Surgical History  Procedure Laterality Date  . Dilation and curettage of uterus    . Knee arthroscopy  2009    left   . Wisdom tooth extraction    . Tmj similar surgery     . Total knee arthroplasty Bilateral 08/25/2014    Procedure: TOTAL KNEE BILATERAL;  Surgeon: Mcarthur Rossetti, MD;  Location: [facility];  Service: Orthopedics;  Laterality: Bilateral;    There were no vitals taken for this visit.  Visit Diagnosis:  S/P TKR (total knee replacement) using cement, bilateral  Difficulty walking      Subjective Assessment - 10/06/14 0848    Symptoms I'm worse in the evening.  I'm improving so slowly, I go 2 weeks without much progress.    Currently in Pain? No/denies   Multiple Pain Sites No          [facility] PT Assessment - 10/06/14 0914    AROM   Right Knee Extension 7   Right Knee Flexion 110  AAROM in supine hip flexed to 90   Left Knee Extension 5   Left Knee Flexion 120   Strength   Right Knee Flexion 4/5   Right Knee Extension 4+/5   Left Knee Flexion 4+/5   Left Knee Extension 4+/5         [facility] Adult PT Treatment/Exercise - 10/06/14 0850    Knee/Hip Exercises: Aerobic   Stationary Bike Level 2-3 for 6 min with gradually incr ROM   Knee/Hip Exercises: Machines for Strengthening   Cybex Knee Extension x 20, 1 plate and 1 rest break  did x 5 single leg  eccentric with AA   Cybex Knee Flexion 2 plates x 20   Knee/Hip Exercises: Standing   Forward Lunges Both;1 set;10 reps  static hold for balance and stability   Knee/Hip Exercises: Prone   Hamstring Curl 1 set;10 reps   Hip Extension Strengthening;Both;1 set;10 reps   Straight Leg Raises Strengthening;Both;1 set;10 reps   Straight Leg Raises Limitations hip abd ans side kicks   Cryotherapy   Number Minutes Cryotherapy --  will ice at work            PT Short Term Goals - 10/04/14 0949    PT SHORT TERM GOAL #4   Title Pt will report full Independence with gait and ADLS in the home with min modification for knee ROM   Period Weeks   Status Achieved           PT Long Term Goals - 10/06/14 1230    PT LONG TERM GOAL #1   Title Pt. will demo and understand body mechanics, RICE as it pertains to activity, work.     Status Achieved   PT LONG TERM GOAL #2   Title Pt.  will be able to stand for 1 hour continuously with min pain in knees at work   Status On-going   PT [place] #3   Title Pt. will work a "full day" and report min occasional pain in knees.    Status On-going   PT LONG TERM GOAL #4   Title Pt will demo knee AROM flexion Rt. to 100 deg and Lt. 110 deg. for overall improved functional mobility   Status Achieved   PT LONG TERM GOAL #5   Title Pt. will demo 4+/5 or greater strength in LEs (hip and knees) for full function at work   Status Partially Met   PT LONG TERM GOAL #6   Title Pt will complete FOTO at 48% limited or less to demo improved function.    Status On-going               Plan - 10/06/14 0931    Clinical Impression Statement This patient is gaining strength and ROM in both knees. Tolerates closed chain with min pain increase.  SHe will complete 2 more weeks of PT and be ready for DC.      PT Next Visit Plan continue with CKC and strength/ROM bilaterally, cont vaso  See's Md on Monday   PT Home Exercise Plan gave sit to stad and wall  squat for HEP- review        Problem List Patient Active Problem List   Diagnosis Date Noted  . Primary osteoarthritis of both knees 08/25/2014  . Status post bilateral knee replacements 08/25/2014    Catrina Fellenz 10/06/2014, 12:31 PM  [facility] [address] [place], [place], [address] Phone: [phone]   Fax:  [phone]

## 2014-10-09 ENCOUNTER — Ambulatory Visit: Payer: Medicare Other | Attending: Orthopaedic Surgery | Admitting: Physical Therapy

## 2014-10-09 DIAGNOSIS — R262 Difficulty in walking, not elsewhere classified: Secondary | ICD-10-CM | POA: Insufficient documentation

## 2014-10-09 DIAGNOSIS — Z96653 Presence of artificial knee joint, bilateral: Secondary | ICD-10-CM | POA: Diagnosis not present

## 2014-10-09 NOTE — Patient Instructions (Signed)
Abduction: Side Leg Lift (Eccentric) - Side-Lying   Lie on side. Lift top leg slightly higher than shoulder level. Keep top leg straight with body, toes pointing forward. Slowly lower for 3-5 seconds. _20_ reps per set, _1-2__ sets per day, ___5 days per week. Add ___ lbs when you achieve ___ repetitions.  Copyright  VHI. All rights reserved.

## 2014-10-09 NOTE — Therapy (Signed)
[place], [place], [address] Phone: [phone]   Fax:  [phone]  Physical Therapy Treatment  Patient Details  Name: Becky Cunningham MRN: [id number] Date of Birth: 1951-07-12 Referring Provider:  Jonathon Bellows, MD  Encounter Date: 10/09/2014      PT End of Session - 10/09/14 0954    Visit Number 9   Number of Visits 24   Date for PT Re-Evaluation 11/06/14   PT Start Time 0933   PT Stop Time 1016   PT Time Calculation (min) 43 min   Activity Tolerance Patient tolerated treatment well      Past Medical History  Diagnosis Date  . Depression   . Arthritis     Past Surgical History  Procedure Laterality Date  . Dilation and curettage of uterus    . Knee arthroscopy  2009    left   . Wisdom tooth extraction    . Tmj similar surgery     . Total knee arthroplasty Bilateral 08/25/2014    Procedure: TOTAL KNEE BILATERAL;  Surgeon: Becky Rossetti, MD;  Location: [facility];  Service: Orthopedics;  Laterality: Bilateral;    There were no vitals taken for this visit.  Visit Diagnosis:  S/P TKR (total knee replacement) using cement, bilateral  Difficulty walking      Subjective Assessment - 10/09/14 0938    Symptoms Becky Cunningham is very happy with my progress.  I don't have to see him anymore. Reports getting more confidence with stairs.     How long can you stand comfortably? >2 hours if I keep moving   How long can you walk comfortably? >2 hours in the store, sits occasionally   Currently in Pain? No/denies   Multiple Pain Sites No          [facility] PT Assessment - 10/09/14 0953    Strength   Left Hip ABduction 4/5  glute med 3+/5    Rt. 4/5 for ABD and glute      [facility] Adult PT Treatment/Exercise - 10/09/14 0957    Knee/Hip Exercises: Aerobic   Stationary Bike NuStep UE/LE level 8, 8 min   Knee/Hip Exercises: Standing   SLS with Vectors Green band hip abd and ext with UE support   needs UE support   Other Standing Knee Exercises hip hikes for glute med off step   Knee/Hip Exercises: Supine   Other Supine Knee Exercises clam with green band for hip ER/IR   Knee/Hip Exercises: Sidelying   Hip ABduction Strengthening;Both;1 set;20 reps   Hip ABduction Limitations done with hip at 45 deg for gluteals     Practiced stairs, MMT for hip      PT Education - 10/09/14 1301    Education provided Yes   Education Details hip/glute strength, gait   Person(s) Educated Patient   Methods Explanation   Comprehension Verbalized understanding          PT Short Term Goals - 10/09/14 1003    PT SHORT TERM GOAL #1   Title Patient will be I with initial HEP for AAROM and strength   Status Achieved   PT SHORT TERM GOAL #2   Title Pt. will achieve AROM in Bilat. knees to 90 deg in sitting with min difficulty   Status New   PT SHORT TERM GOAL #3   Title Pt will achieve +5-8 deg extension in bilateral knees (Rt, Lt respectively) for improved gait   Status On-going  PT SHORT TERM GOAL #4   Title Pt will report full Independence with gait and ADLS in the home with min modification for knee ROM   Status Achieved   PT SHORT TERM GOAL #5   Title Pt. will negotiate 12 stairs in the community reciprocally and min increase in pain using hand rails.    Status Achieved           PT Long Term Goals - 10/09/14 1303    PT LONG TERM GOAL #1   Title Pt. will demo and understand body mechanics, RICE as it pertains to activity, work.     Status Achieved   PT LONG TERM GOAL #2   Title Pt. will be able to stand for 1 hour continuously with min pain in knees at work   Status Achieved   PT LONG TERM GOAL #3   Title Pt. will work a "full day" and report min occasional pain in knees.    Status On-going   PT LONG TERM GOAL #4   Title Pt will demo knee AROM flexion Rt. to 100 deg and Lt. 110 deg. for overall improved functional mobility   Status Achieved   PT LONG TERM GOAL #5    Title Pt. will demo 4+/5 or greater strength in LEs (hip and knees) for full function at work   Status Partially Met   PT LONG TERM GOAL #6   Title Pt will complete FOTO at 48% limited or less to demo improved function.    Status On-going               Plan - 10/09/14 1610    Clinical Impression Statement Focused on hip and core strength today.  Fatigues quickly but realizes she can benefit from overall strengthening. Stairs without difficulty. Goals met for stairs.   PT Next Visit Plan review full HEP and likely DC this week, assess ROM, GCODE, FOTO   PT Home Exercise Plan added hip abd in sidelying        Problem List Patient Active Problem List   Diagnosis Date Noted  . Primary osteoarthritis of both knees 08/25/2014  . Status post bilateral knee replacements 08/25/2014    Becky Cunningham 10/09/2014, 1:08 PM  [facility] [address] [place], [place], [address] Phone: [phone]   Fax:  [phone]

## 2014-10-11 ENCOUNTER — Ambulatory Visit: Payer: Medicare Other | Admitting: Physical Therapy

## 2014-10-11 DIAGNOSIS — R262 Difficulty in walking, not elsewhere classified: Secondary | ICD-10-CM | POA: Diagnosis not present

## 2014-10-11 DIAGNOSIS — Z96653 Presence of artificial knee joint, bilateral: Secondary | ICD-10-CM | POA: Diagnosis not present

## 2014-10-11 NOTE — Therapy (Signed)
[place], [place], [address] Phone: [phone]   Fax:  [phone]  Physical Therapy Treatment  Patient Details  Name: Becky Cunningham MRN: [id number] Date of Birth: 24-Jul-1951 Referring Provider:  Jonathon Bellows, MD  Encounter Date: 10/11/2014      PT End of Session - 10/11/14 0913    Visit Number 10   Number of Visits 24   Date for PT Re-Evaluation 11/06/14   PT Start Time 0849   PT Stop Time 0945   PT Time Calculation (min) 56 min   Activity Tolerance Patient tolerated treatment well      Past Medical History  Diagnosis Date  . Depression   . Arthritis     Past Surgical History  Procedure Laterality Date  . Dilation and curettage of uterus    . Knee arthroscopy  2009    left   . Wisdom tooth extraction    . Tmj similar surgery     . Total knee arthroplasty Bilateral 08/25/2014    Procedure: TOTAL KNEE BILATERAL;  Surgeon: Mcarthur Rossetti, MD;  Location: [facility];  Service: Orthopedics;  Laterality: Bilateral;    There were no vitals taken for this visit.  Visit Diagnosis:  S/P TKR (total knee replacement) using cement, bilateral      Subjective Assessment - 10/11/14 0853    Symptoms Proximax scar burns, it looks the worse.  I want to work on strengthening.                    [place] Adult PT Treatment/Exercise - 10/11/14 0855    Knee/Hip Exercises: Aerobic   Stationary Bike level 3    Knee/Hip Exercises: Machines for Strengthening   Cybex Knee Flexion 2 plates   Cybex Leg Press 2 plates 10 reps cuse to keep back on table   Knee/Hip Exercises: Standing   Step Down --  2 sets of 5 reps, Rt painful   SLS Lt 20 seconds, Rt 12   Manual Therapy   Manual Therapy --  Scar tissue mobilization whole scar Rt, proximal scar Lt,                   PT Short Term Goals - 10/11/14 0935    PT SHORT TERM GOAL #1   Title Patient will be I with initial HEP for AAROM  and strength   Time 4   Period Weeks   Status Achieved   PT SHORT TERM GOAL #2   Title Pt. will achieve AROM in Bilat. knees to 90 deg in sitting with min difficulty   Period Weeks   Status Achieved   PT SHORT TERM GOAL #3   Title Pt will achieve +5-8 deg extension in bilateral knees (Rt, Lt respectively) for improved gait   Time 4   Period Weeks   Status Unable to assess   PT SHORT TERM GOAL #4   Title Pt will report full Independence with gait and ADLS in the home with min modification for knee ROM           PT Long Term Goals - 10/09/14 1303    PT LONG TERM GOAL #1   Title Pt. will demo and understand body mechanics, RICE as it pertains to activity, work.     Status Achieved   PT LONG TERM GOAL #2   Title Pt. will be able to stand for 1 hour continuously with min pain in knees at  work   Status Achieved   PT LONG TERM GOAL #3   Title Pt. will work a "full day" and report min occasional pain in knees.    Status On-going   PT LONG TERM GOAL #4   Title Pt will demo knee AROM flexion Rt. to 100 deg and Lt. 110 deg. for overall improved functional mobility   Status Achieved   PT LONG TERM GOAL #5   Title Pt. will demo 4+/5 or greater strength in LEs (hip and knees) for full function at work   Status Partially Met   PT LONG TERM GOAL #6   Title Pt will complete FOTO at 48% limited or less to demo improved function.    Status On-going    FOTO done today.           Plan - 10/11/14 0934    Clinical Impression Statement strengthening.  Rewiewed how to exercise at home and do some during day.   PT Next Visit Plan  Measure check goals.        Problem List Patient Active Problem List   Diagnosis Date Noted  . Primary osteoarthritis of both knees 08/25/2014  . Status post bilateral knee replacements 08/25/2014  Melvenia Needles, PTA 10/11/2014 12:59 PM Phone: [phone] Fax: [phone]   [facility] 10/11/2014, 12:59 PM  [facility] [address] [place], [place], [address] Phone: [phone]   Fax:  [phone]

## 2014-10-11 NOTE — Patient Instructions (Signed)
Discussed ways to get more formal exercise in her day.

## 2014-10-13 ENCOUNTER — Ambulatory Visit: Payer: Medicare Other | Admitting: Physical Therapy

## 2014-10-13 DIAGNOSIS — R262 Difficulty in walking, not elsewhere classified: Secondary | ICD-10-CM | POA: Diagnosis not present

## 2014-10-13 DIAGNOSIS — Z96653 Presence of artificial knee joint, bilateral: Secondary | ICD-10-CM | POA: Diagnosis not present

## 2014-10-13 NOTE — Therapy (Signed)
[place], [place], [address] Phone: [phone]   Fax:  [phone]  Physical Therapy Treatment  Patient Details  Name: Becky Cunningham MRN: [id number] Date of Birth: Aug 31, 1951 Referring Provider:  Jonathon Bellows, MD  Encounter Date: 10/13/2014    Past Medical History  Diagnosis Date  . Depression   . Arthritis     Past Surgical History  Procedure Laterality Date  . Dilation and curettage of uterus    . Knee arthroscopy  2009    left   . Wisdom tooth extraction    . Tmj similar surgery     . Total knee arthroplasty Bilateral 08/25/2014    Procedure: TOTAL KNEE BILATERAL;  Surgeon: Mcarthur Rossetti, MD;  Location: [facility];  Service: Orthopedics;  Laterality: Bilateral;    There were no vitals taken for this visit.  Visit Diagnosis:  No diagnosis found.      Subjective Assessment - 10/13/14 0848    Symptoms No complaints today. Ready for DC          [facility] PT Assessment - 10/13/14 0855    AROM   Right Knee Extension 4   Right Knee Flexion 114   Left Knee Extension 3   Left Knee Flexion 122   Strength   Right Knee Flexion 5/5   Right Knee Extension 5/5   Left Knee Flexion 5/5   Left Knee Extension 5/5           [facility] Adult PT Treatment/Exercise - 10/13/14 0854    Knee/Hip Exercises: Stretches   Active Hamstring Stretch 2 reps;30 seconds   Knee: Self-Stretch to increase Flexion 2 reps;30 seconds   ITB Stretch 2 reps;30 seconds   Knee/Hip Exercises: Machines for Strengthening   Cybex Leg Press single leg 1 plate x 10 each   Total Gym Leg Press rotary hip 2 plates add, abd and ext x10 each LE   Knee/Hip Exercises: Standing   Wall Squat 2 sets;10 reps           PT Short Term Goals - 10/11/14 0935    PT SHORT TERM GOAL #1   Title Patient will be I with initial HEP for AAROM and strength   Time 4   Period Weeks   Status Achieved   PT SHORT TERM GOAL #2   Title Pt. will  achieve AROM in Bilat. knees to 90 deg in sitting with min difficulty   Period Weeks   Status Achieved   PT SHORT TERM GOAL #3   Title Pt will achieve +5-8 deg extension in bilateral knees (Rt, Lt respectively) for improved gait   Time 4   Period Weeks   Status Unable to assess   PT SHORT TERM GOAL #4   Title Pt will report full Independence with gait and ADLS in the home with min modification for knee ROM           PT Long Term Goals - 10/09/14 1303    PT LONG TERM GOAL #1   Title Pt. will demo and understand body mechanics, RICE as it pertains to activity, work.     Status Achieved   PT LONG TERM GOAL #2   Title Pt. will be able to stand for 1 hour continuously with min pain in knees at work   Status Achieved   PT LONG TERM GOAL #3   Title Pt. will work a "full day" and report min occasional pain in knees.  Status On-going   PT LONG TERM GOAL #4   Title Pt will demo knee AROM flexion Rt. to 100 deg and Lt. 110 deg. for overall improved functional mobility   Status Achieved   PT LONG TERM GOAL #5   Title Pt. will demo 4+/5 or greater strength in LEs (hip and knees) for full function at work   Status Partially Met   PT LONG TERM GOAL #6   Title Pt will complete FOTO at 48% limited or less to demo improved function.    Status On-going                 G-Codes - 2014/10/16 0915    Functional Assessment Tool Used FOTO   Functional Limitation Mobility: Walking and moving around   Mobility: Walking and Moving Around Current Status [phone]) At least 20 percent but less than 40 percent impaired, limited or restricted   Mobility: Walking and Moving Around Goal Status [phone]) At least 40 percent but less than 60 percent impaired, limited or restricted   Mobility: Walking and Moving Around Discharge Status [phone]) At least 20 percent but less than 40 percent impaired, limited or restricted      Problem List Patient Active Problem List   Diagnosis Date Noted  . Primary  osteoarthritis of both knees 08/25/2014  . Status post bilateral knee replacements 08/25/2014    Mann Skaggs 2014/10/16, 9:33 AM  [place], [place], [address] Phone: [phone]   Fax:  [phone]  PHYSICAL THERAPY DISCHARGE SUMMARY  Visits from Start of Care: 11  Current functional level related to goals / functional outcomes: See above for goals met   Remaining deficits: None limiting function   Education / Equipment: HEP, activity Plan: Patient agrees to discharge.  Patient goals were met. Patient is being discharged due to meeting the stated rehab goals.  ?????   .Raeford Razor, PT 10-16-2014 9:37 AM Phone: [phone] Fax: [phone]

## 2014-10-16 ENCOUNTER — Encounter: Payer: Medicare Other | Admitting: Physical Therapy

## 2014-10-18 ENCOUNTER — Encounter: Payer: Medicare Other | Admitting: Physical Therapy

## 2014-10-20 ENCOUNTER — Encounter: Payer: Medicare Other | Admitting: Physical Therapy

## 2014-12-18 DIAGNOSIS — F900 Attention-deficit hyperactivity disorder, predominantly inattentive type: Secondary | ICD-10-CM | POA: Diagnosis not present

## 2014-12-18 DIAGNOSIS — Z0000 Encounter for general adult medical examination without abnormal findings: Secondary | ICD-10-CM | POA: Diagnosis not present

## 2014-12-18 DIAGNOSIS — E785 Hyperlipidemia, unspecified: Secondary | ICD-10-CM | POA: Diagnosis not present

## 2014-12-18 DIAGNOSIS — Z23 Encounter for immunization: Secondary | ICD-10-CM | POA: Diagnosis not present

## 2014-12-18 DIAGNOSIS — M1990 Unspecified osteoarthritis, unspecified site: Secondary | ICD-10-CM | POA: Diagnosis not present

## 2014-12-18 DIAGNOSIS — F3342 Major depressive disorder, recurrent, in full remission: Secondary | ICD-10-CM | POA: Diagnosis not present

## 2014-12-18 DIAGNOSIS — R030 Elevated blood-pressure reading, without diagnosis of hypertension: Secondary | ICD-10-CM | POA: Diagnosis not present

## 2015-02-09 DIAGNOSIS — Z471 Aftercare following joint replacement surgery: Secondary | ICD-10-CM | POA: Diagnosis not present

## 2015-02-09 DIAGNOSIS — Z96651 Presence of right artificial knee joint: Secondary | ICD-10-CM | POA: Diagnosis not present

## 2015-05-07 ENCOUNTER — Encounter: Payer: Self-pay | Admitting: Physical Therapy

## 2015-05-07 NOTE — Therapy (Unsigned)
[place], [place], [address] Phone: [phone]   Fax:  [phone]  Patient Details  Name: Becky Cunningham MRN: [id number] Date of Birth: 04-22-1951 Referring Provider:  No ref. provider found  Encounter Date: Jun 05, 2015 G-Codes - 11/11/14 0915    Functional Assessment Tool Used FOTO   Functional Limitation Mobility: Walking and moving around   Mobility: Walking and Moving Around Current Status [phone]) At least 20 percent but less than 40 percent impaired, limited or restricted   Mobility: Walking and Moving Around Goal Status [phone]) At least 40 percent but less than 60 percent impaired, limited or restricted   Mobility: Walking and Moving Around Discharge Status [phone]) At least 20 percent but less than 40 percent impaired, limited or restricted      Problem List Patient Active Problem List   Diagnosis Date Noted  . Primary osteoarthritis of both knees 08/25/2014  . Status post bilateral knee replacements 08/25/2014    Caide Campi 2014-11-11, 9:33 AM  [place], [place], [address] Phone: [phone] Fax: [phone]  PHYSICAL THERAPY DISCHARGE SUMMARY  Visits from Start of Care: 11  Current functional level related to goals / functional outcomes: See above for goals met  Remaining deficits: None limiting function  Education / Equipment: HEP, activity Plan: Patient agrees to discharge. Patient goals were met. Patient is being discharged due to meeting the stated rehab goals. ?????           Lua Feng 2015/06/05, 8:50 AM  [facility] [address] [place], [place], [address] Phone: [phone]   Fax:  [phone]

## 2015-06-21 DIAGNOSIS — F900 Attention-deficit hyperactivity disorder, predominantly inattentive type: Secondary | ICD-10-CM | POA: Diagnosis not present

## 2015-06-21 DIAGNOSIS — Z23 Encounter for immunization: Secondary | ICD-10-CM | POA: Diagnosis not present

## 2015-06-21 DIAGNOSIS — E785 Hyperlipidemia, unspecified: Secondary | ICD-10-CM | POA: Diagnosis not present

## 2015-08-20 DIAGNOSIS — M1711 Unilateral primary osteoarthritis, right knee: Secondary | ICD-10-CM | POA: Diagnosis not present

## 2015-08-20 DIAGNOSIS — M1712 Unilateral primary osteoarthritis, left knee: Secondary | ICD-10-CM | POA: Diagnosis not present

## 2015-09-22 IMAGING — DX DG KNEE 1-2V PORT*L*
1 series · 2 of 2 positions shown · non-contrast
Comparison: None.

CLINICAL DATA: Status post knee replacement

EXAM:
PORTABLE LEFT KNEE - 1-2 VIEW

[Series 1: AP · left · 2 of 2 slices shown]
[im 1/2]
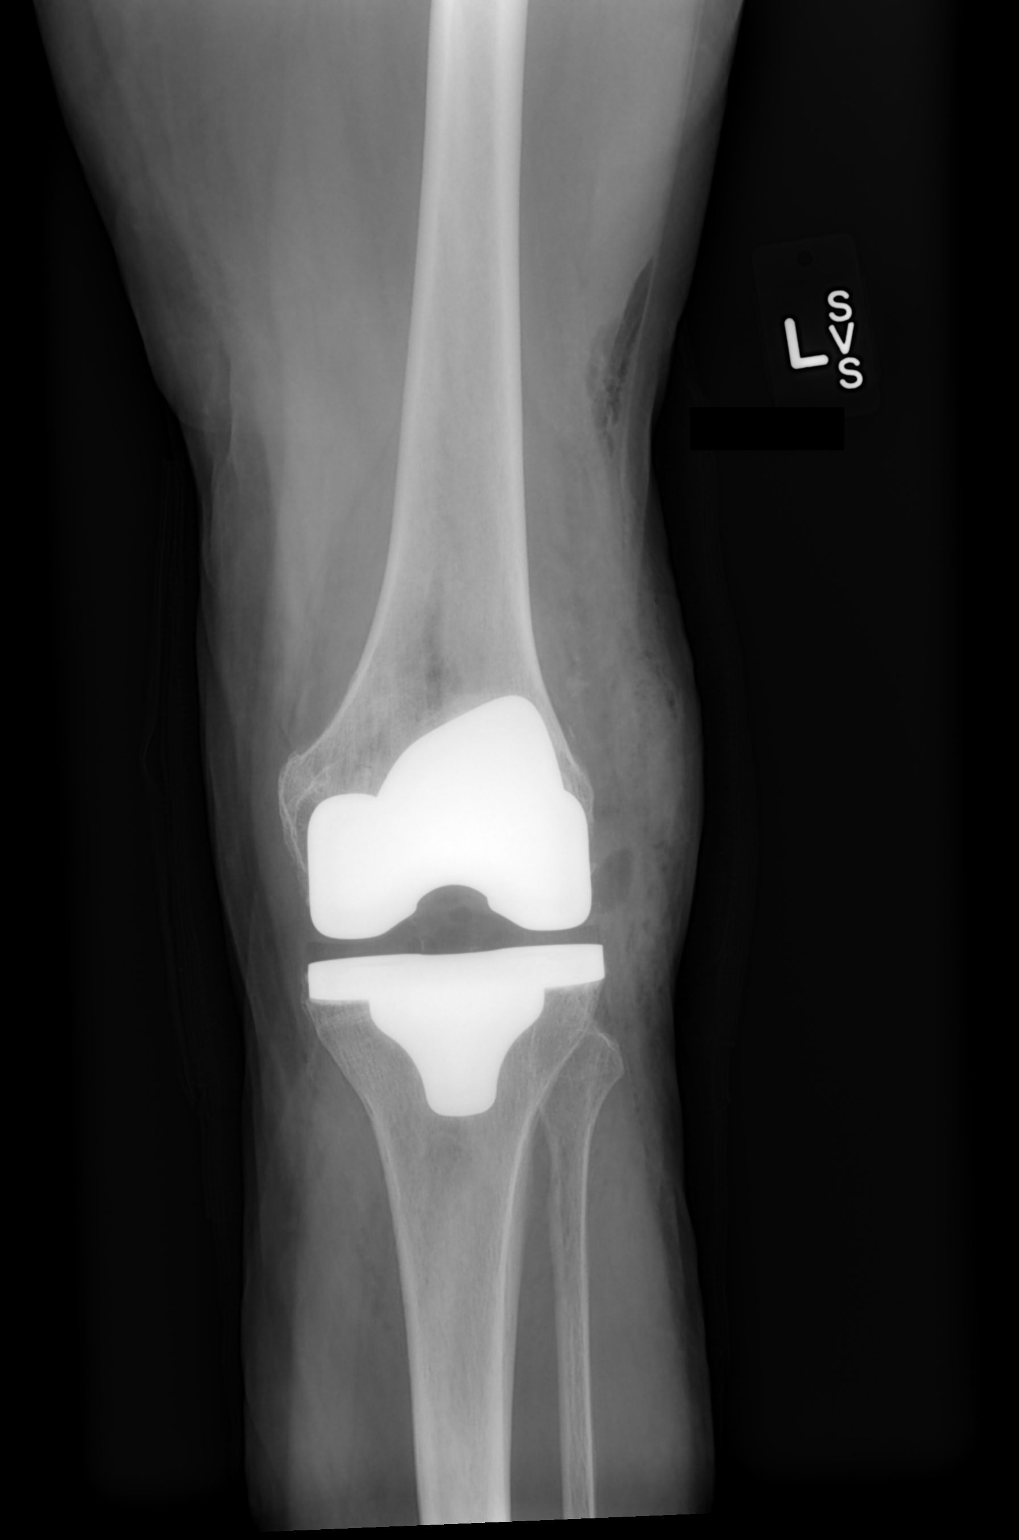
[im 2/2]
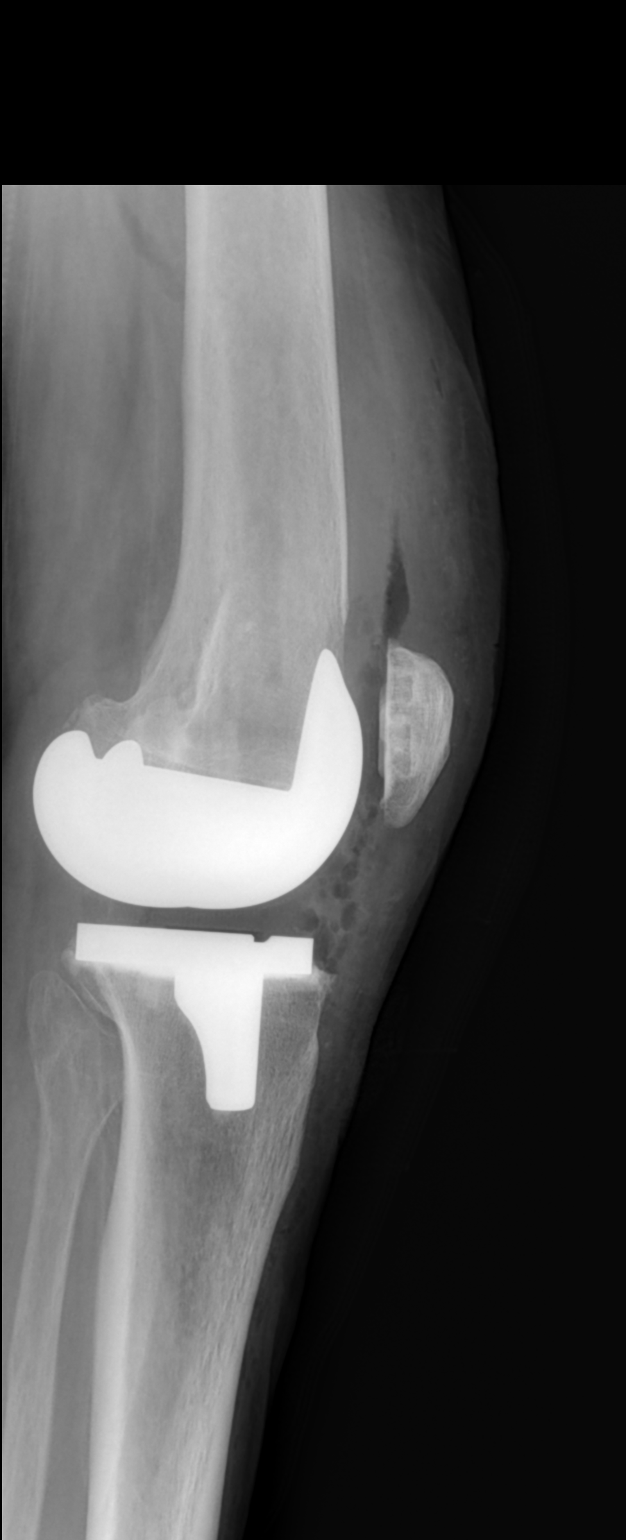

[2 of 2 positions shown; findings below may reference images not displayed]

FINDINGS: Left knee replacement is noted. Considerable air is noted in the
surgical bed. No acute bony abnormality is noted.
IMPRESSION: Status post left knee replacement.

## 2015-10-02 ENCOUNTER — Ambulatory Visit
Admission: RE | Admit: 2015-10-02 | Discharge: 2015-10-02 | Disposition: A | Discharge status: Home / Self Care | Payer: Medicare Other | Source: Ambulatory Visit | Attending: Family Medicine | Admitting: Family Medicine

## 2015-10-02 ENCOUNTER — Other Ambulatory Visit: Payer: Self-pay | Admitting: Family Medicine

## 2015-10-02 DIAGNOSIS — M1611 Unilateral primary osteoarthritis, right hip: Secondary | ICD-10-CM | POA: Diagnosis not present

## 2015-10-02 DIAGNOSIS — M25551 Pain in right hip: Secondary | ICD-10-CM

## 2015-10-15 DIAGNOSIS — M25551 Pain in right hip: Secondary | ICD-10-CM | POA: Diagnosis not present

## 2015-10-15 DIAGNOSIS — M25552 Pain in left hip: Secondary | ICD-10-CM | POA: Diagnosis not present

## 2015-10-17 DIAGNOSIS — M25551 Pain in right hip: Secondary | ICD-10-CM | POA: Diagnosis not present

## 2015-10-31 DIAGNOSIS — M25551 Pain in right hip: Secondary | ICD-10-CM | POA: Diagnosis not present

## 2016-01-04 DIAGNOSIS — Z0000 Encounter for general adult medical examination without abnormal findings: Secondary | ICD-10-CM | POA: Diagnosis not present

## 2016-01-04 DIAGNOSIS — R739 Hyperglycemia, unspecified: Secondary | ICD-10-CM | POA: Diagnosis not present

## 2016-01-04 DIAGNOSIS — F900 Attention-deficit hyperactivity disorder, predominantly inattentive type: Secondary | ICD-10-CM | POA: Diagnosis not present

## 2016-01-04 DIAGNOSIS — E785 Hyperlipidemia, unspecified: Secondary | ICD-10-CM | POA: Diagnosis not present

## 2016-01-04 DIAGNOSIS — F3342 Major depressive disorder, recurrent, in full remission: Secondary | ICD-10-CM | POA: Diagnosis not present

## 2016-01-04 DIAGNOSIS — Z23 Encounter for immunization: Secondary | ICD-10-CM | POA: Diagnosis not present

## 2016-01-31 DIAGNOSIS — M25551 Pain in right hip: Secondary | ICD-10-CM | POA: Diagnosis not present

## 2016-02-08 DIAGNOSIS — M1611 Unilateral primary osteoarthritis, right hip: Secondary | ICD-10-CM | POA: Diagnosis not present

## 2016-02-08 DIAGNOSIS — R600 Localized edema: Secondary | ICD-10-CM | POA: Diagnosis not present

## 2016-02-14 DIAGNOSIS — M1611 Unilateral primary osteoarthritis, right hip: Secondary | ICD-10-CM | POA: Diagnosis not present

## 2016-07-08 DIAGNOSIS — E785 Hyperlipidemia, unspecified: Secondary | ICD-10-CM | POA: Diagnosis not present

## 2016-07-08 DIAGNOSIS — R739 Hyperglycemia, unspecified: Secondary | ICD-10-CM | POA: Diagnosis not present

## 2016-07-08 DIAGNOSIS — F3342 Major depressive disorder, recurrent, in full remission: Secondary | ICD-10-CM | POA: Diagnosis not present

## 2016-07-08 DIAGNOSIS — F900 Attention-deficit hyperactivity disorder, predominantly inattentive type: Secondary | ICD-10-CM | POA: Diagnosis not present

## 2016-07-08 DIAGNOSIS — R030 Elevated blood-pressure reading, without diagnosis of hypertension: Secondary | ICD-10-CM | POA: Diagnosis not present

## 2016-07-08 DIAGNOSIS — Z23 Encounter for immunization: Secondary | ICD-10-CM | POA: Diagnosis not present

## 2016-10-16 DIAGNOSIS — N949 Unspecified condition associated with female genital organs and menstrual cycle: Secondary | ICD-10-CM | POA: Diagnosis not present

## 2016-10-16 DIAGNOSIS — N390 Urinary tract infection, site not specified: Secondary | ICD-10-CM | POA: Diagnosis not present

## 2016-10-16 DIAGNOSIS — M79672 Pain in left foot: Secondary | ICD-10-CM | POA: Diagnosis not present

## 2016-10-16 DIAGNOSIS — R300 Dysuria: Secondary | ICD-10-CM | POA: Diagnosis not present

## 2017-01-12 DIAGNOSIS — L720 Epidermal cyst: Secondary | ICD-10-CM | POA: Diagnosis not present

## 2017-01-16 ENCOUNTER — Other Ambulatory Visit: Payer: Self-pay | Admitting: Family Medicine

## 2017-01-16 ENCOUNTER — Ambulatory Visit
Admission: RE | Admit: 2017-01-16 | Discharge: 2017-01-16 | Disposition: A | Discharge status: Home / Self Care | Payer: Medicare Other | Source: Ambulatory Visit | Attending: Family Medicine | Admitting: Family Medicine

## 2017-01-16 DIAGNOSIS — Z0000 Encounter for general adult medical examination without abnormal findings: Secondary | ICD-10-CM | POA: Diagnosis not present

## 2017-01-16 DIAGNOSIS — Z5181 Encounter for therapeutic drug level monitoring: Secondary | ICD-10-CM | POA: Diagnosis not present

## 2017-01-16 DIAGNOSIS — F3342 Major depressive disorder, recurrent, in full remission: Secondary | ICD-10-CM | POA: Diagnosis not present

## 2017-01-16 DIAGNOSIS — E785 Hyperlipidemia, unspecified: Secondary | ICD-10-CM | POA: Diagnosis not present

## 2017-01-16 DIAGNOSIS — M79672 Pain in left foot: Secondary | ICD-10-CM

## 2017-01-16 DIAGNOSIS — M19072 Primary osteoarthritis, left ankle and foot: Secondary | ICD-10-CM | POA: Diagnosis not present

## 2017-01-16 DIAGNOSIS — R739 Hyperglycemia, unspecified: Secondary | ICD-10-CM | POA: Diagnosis not present

## 2017-01-16 DIAGNOSIS — F900 Attention-deficit hyperactivity disorder, predominantly inattentive type: Secondary | ICD-10-CM | POA: Diagnosis not present

## 2017-03-05 DIAGNOSIS — M8588 Other specified disorders of bone density and structure, other site: Secondary | ICD-10-CM | POA: Diagnosis not present

## 2017-03-05 DIAGNOSIS — E2839 Other primary ovarian failure: Secondary | ICD-10-CM | POA: Diagnosis not present

## 2017-03-30 ENCOUNTER — Ambulatory Visit (INDEPENDENT_AMBULATORY_CARE_PROVIDER_SITE_OTHER): Payer: Medicare Other | Admitting: Orthopaedic Surgery

## 2017-03-30 DIAGNOSIS — M25532 Pain in left wrist: Secondary | ICD-10-CM | POA: Diagnosis not present

## 2017-03-30 MED ORDER — METHYLPREDNISOLONE ACETATE 40 MG/ML IJ SUSP
40.0000 mg | INTRAMUSCULAR | Status: AC | PRN
  Administered 2017-03-30: 40 mg via INTRAMUSCULAR

## 2017-03-30 MED ORDER — LIDOCAINE HCL 1 % IJ SOLN
1.0000 mL | INTRAMUSCULAR | Status: AC | PRN
  Administered 2017-03-30: 1 mL

## 2017-03-30 NOTE — Progress Notes (Signed)
Office Visit Note   Patient: Becky Cunningham           Date of Birth: 10-26-1950           MRN: [id number] Visit Date: 03/30/2017              Requested by: Shirlean MylarWebb, Carol, MD [address] [place], [place] [address] PCP: Shirlean MylarWebb, Carol, MD   Assessment & Plan: Visit Diagnoses:  1. Pain in left wrist     Plan: She is already on diclofenac as an anti-inflammatory. Talked about trying a steroid injection almost as a trigger point in this area and she was agreeable to this. I spent the risk and benefits injections. Also will try a Velcro wrist splint gave her instructions about the use of this and when to wear. She says she can follow up as needed and if it hurts her bad enough and doesn't calm down she will let us know.  Follow-Up Instructions: Return if symptoms worsen or fail to improve.   Orders:  No orders of the defined types were placed in this encounter.  No orders of the defined types were placed in this encounter.     Procedures: Trigger Point Inj Date/Time: 03/30/2017 4:50 PM Performed by: Kathryne HitchBLACKMAN, Wallie Lagrand Y Authorized by: Kathryne HitchBLACKMAN, Kiesha Ensey Y   Total # of Trigger Points:  1 Medications #1:  1 mL lidocaine 1 %; 40 mg methylPREDNISolone acetate 40 MG/ML     Clinical Data: No additional findings.   Subjective: No chief complaint on file. The patient is well-known to me. She comes in with a new chief complaint of left wrist pain and numbness and tingling and a burning sensation. She is only burning in her wrist and not in her fingers at all. 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 She says it hurts more with wrist extension and it does flexion. Again she denies any specific injuries and denies numbness and tingling in her fingers it's only in her wrist. Has any elbow or shoulder pain or neck pain. HPI  Review of Systems  she denies any chest pain, shortness of breath,  fever, chills, nausea, vomiting  Objective: Vital Signs: There were no vitals taken for this visit.  Physical Exam  she is alert and oriented 3 in no acute distress Ortho Exam  generation of her left wrist shows pain with flexion and extension of the wrist more so with extension. She is painful and swollen over the volar aspect. Her Phalen's and Tinel sign is negative. She's got good grip strength but it is painful to her. Specialty Comments:  No specialty comments available.  Imaging: No results found.   PMFS History: Patient Active Problem List   Diagnosis Date Noted  . Pain in left wrist 03/30/2017  . Primary osteoarthritis of both knees 08/25/2014  . Status post bilateral knee replacements 08/25/2014   Past Medical History:  Diagnosis Date  . Arthritis   . Depression     No family history on file.  Past Surgical History:  Procedure Laterality Date  . DILATION AND CURETTAGE OF UTERUS    . KNEE ARTHROSCOPY  2009   left   . TMJ similar surgery     . TOTAL KNEE ARTHROPLASTY Bilateral 08/25/2014   Procedure: TOTAL KNEE BILATERAL;  Surgeon: Kathryne Hitchhristopher Y Malini Flemings, MD;  Location: [facility];  Service: Orthopedics;  Laterality: Bilateral;  . WISDOM TOOTH  EXTRACTION     Social History   Occupational History  . Not on file.   Social History Main Topics  . Smoking status: Never Smoker  . Smokeless tobacco: Never Used  . Alcohol use 8.4 oz/week    14 Glasses of wine per week  . Drug use: No  . Sexual activity: Not on file

## 2017-05-19 ENCOUNTER — Ambulatory Visit (INDEPENDENT_AMBULATORY_CARE_PROVIDER_SITE_OTHER): Payer: Medicare Other | Admitting: Orthopaedic Surgery

## 2017-05-19 ENCOUNTER — Ambulatory Visit (INDEPENDENT_AMBULATORY_CARE_PROVIDER_SITE_OTHER): Payer: Medicare Other

## 2017-05-19 DIAGNOSIS — M25552 Pain in left hip: Secondary | ICD-10-CM | POA: Diagnosis not present

## 2017-05-19 DIAGNOSIS — M7061 Trochanteric bursitis, right hip: Secondary | ICD-10-CM

## 2017-05-19 MED ORDER — LIDOCAINE HCL 1 % IJ SOLN
3.0000 mL | INTRAMUSCULAR | Status: AC | PRN
  Administered 2017-05-19: 3 mL

## 2017-05-19 MED ORDER — METHYLPREDNISOLONE ACETATE 40 MG/ML IJ SUSP
40.0000 mg | INTRAMUSCULAR | Status: AC | PRN
  Administered 2017-05-19: 40 mg via INTRA_ARTICULAR

## 2017-05-19 NOTE — Progress Notes (Signed)
Office Visit Note   Patient: Ricardo JerichoRobin M Dimmer           Date of Birth: [birth year]           MRN: [id number] Visit Date: 05/19/2017              Requested by: Shirlean MylarWebb, Carol, MD [address] [place], [place] [address] PCP: Shirlean MylarWebb, Carol, MD   Assessment & Plan: Visit Diagnoses:  1. Pain in left hip   2. Trochanteric bursitis, right hip     Plan: As of now her right hip arthritis is not having much effect on her at all. She says is not detrimentally affecting her activity is daily living, her quality of life or her mobility to proceed with any type of hip replacement surgery and I agree they saw her clinical exam. She did wish to have a trochanteric injection on her left side understanding fully the risks and benefits of this and she tolerated this well. She says she will come see me if she is having problems so we'll follow up otherwise as needed.  Follow-Up Instructions: Return if symptoms worsen or fail to improve.   Orders:  Orders Placed This Encounter  Procedures  . Large Joint Injection/Arthrocentesis  . XR HIP UNILAT W OR W/O PELVIS 1V LEFT   No orders of the defined types were placed in this encounter.     Procedures: Large Joint Inj Date/Time: 05/19/2017 11:35 AM Performed by: Kathryne HitchBLACKMAN, Lorena Clearman Y Authorized by: Kathryne HitchBLACKMAN, Alexanderia Gorby Y   Location:  Hip Site:  L greater trochanter Ultrasound Guidance: No   Fluoroscopic Guidance: No   Arthrogram: No   Medications:  3 mL lidocaine 1 %; 40 mg methylPREDNISolone acetate 40 MG/ML     Clinical Data: No additional findings.   Subjective: No chief complaint on file.  HPI Patient is well-known to me. She has a history of bilateral knee replacements done at different times. I've seen her for her hips afford her right hip bothered her in January 2017 I provided a trochanteric injection she said that helped she has no pain in her right hip at all. X-rays of the time of her right hip showed no sniffing  arthritic changes but a few small osteophytes. She comes in today with left hip pain and says is on the lateral aspect of her hip. She denies any right hip pain.    Review of Systems She currently denies any headache, chest pain, fever, chills, nausea, vomiting, shortness of breath  Objective: Vital Signs: There were no vitals taken for this visit.  Physical Exam He is alert and oriented 3 and in no acute distress. She does not walk with a limp. She is not using assistive device. She gets up on the exam table quickly and easily. Ortho Exam She is a very thin individual. Both hips show fluid and full range of motion with just a slight pain on the right and left hips with motion it's more of the trochanteric area but some in the groin and the right side but is very minimal. Again her range of motion is entirely full. Specialty Comments:  No specialty comments available.  Imaging: Xr Hip Unilat W Or W/o Pelvis 1v Left  Result Date: 05/19/2017 An AP pelvis and lateral of the left hip shows end-stage arthritis of the right hip no significant acute findings are arthritic changes of left hip. The right hip has sclerotic changes and complete loss of joint space with significant  para-articular osteophytes and is dramatically changed from x-rays of January 2017 in comparison.    PMFS History: Patient Active Problem List   Diagnosis Date Noted  . Pain in left wrist 03/30/2017  . Primary osteoarthritis of both knees 08/25/2014  . Status post bilateral knee replacements 08/25/2014   Past Medical History:  Diagnosis Date  . Arthritis   . Depression     No family history on file.  Past Surgical History:  Procedure Laterality Date  . DILATION AND CURETTAGE OF UTERUS    . KNEE ARTHROSCOPY  2009   left   . TMJ similar surgery     . TOTAL KNEE ARTHROPLASTY Bilateral 08/25/2014   Procedure: TOTAL KNEE BILATERAL;  Surgeon: Kathryne Hitch, MD;  Location: [facility];  Service: Orthopedics;   Laterality: Bilateral;  . WISDOM TOOTH EXTRACTION     Social History   Occupational History  . Not on file.   Social History Main Topics  . Smoking status: Never Smoker  . Smokeless tobacco: Never Used  . Alcohol use 8.4 oz/week    14 Glasses of wine per week  . Drug use: No  . Sexual activity: Not on file

## 2017-06-19 DIAGNOSIS — M25552 Pain in left hip: Secondary | ICD-10-CM | POA: Diagnosis not present

## 2017-06-22 ENCOUNTER — Other Ambulatory Visit: Payer: Self-pay | Admitting: Family Medicine

## 2017-06-22 DIAGNOSIS — M25552 Pain in left hip: Secondary | ICD-10-CM

## 2017-06-30 ENCOUNTER — Ambulatory Visit
Admission: RE | Admit: 2017-06-30 | Discharge: 2017-06-30 | Disposition: A | Discharge status: Home / Self Care | Payer: Medicare Other | Source: Ambulatory Visit | Attending: Family Medicine | Admitting: Family Medicine

## 2017-06-30 DIAGNOSIS — M1612 Unilateral primary osteoarthritis, left hip: Secondary | ICD-10-CM | POA: Diagnosis not present

## 2017-06-30 DIAGNOSIS — M25552 Pain in left hip: Secondary | ICD-10-CM

## 2017-07-02 ENCOUNTER — Ambulatory Visit (INDEPENDENT_AMBULATORY_CARE_PROVIDER_SITE_OTHER): Payer: Medicare Other | Admitting: Orthopaedic Surgery

## 2017-07-07 ENCOUNTER — Ambulatory Visit (INDEPENDENT_AMBULATORY_CARE_PROVIDER_SITE_OTHER): Payer: Medicare Other | Admitting: Orthopaedic Surgery

## 2017-07-07 DIAGNOSIS — M1611 Unilateral primary osteoarthritis, right hip: Secondary | ICD-10-CM | POA: Insufficient documentation

## 2017-07-07 DIAGNOSIS — M1612 Unilateral primary osteoarthritis, left hip: Secondary | ICD-10-CM | POA: Diagnosis not present

## 2017-07-07 NOTE — Progress Notes (Signed)
The patient is well-known to me.  She has a history of bilateral knee replacements.  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  Her left hip is been hurting quite a bit in the groin.  Her x-rays are normal left hip so she was sent for an MRI she is here for review of this today.  She is able to cane.  She is a very thin individual.  She denies being a smoker or any other hematologic disorders.  On exam I can fluidly move both hips through full range of motion of both of them are significantly painful.  I did review the x-rays with her of her pelvis and it shows end-stage arthritis of the right hip with complete loss of joint space, periarticular osteophytes, sclerotic and cystic changes in the femoral head and acetabulum.  Left hip on plain films appears normal.  However the MRI is reviewed with her and shows severe end-stage arthritis of the right hip the left hip has flattening of the femoral head and edema throughout the femoral head into the neck and trochanteric region suggesting an insufficiency fracture but there is also extensive partial-thickness cartilage loss in the loose body in the hip and significant hip joint effusion.  At this point she is interested in considering bilateral hip replacement surgery in January.  She is going to be careful getting around her hips and use either cane or walker.  I would like to see her back in 4 weeks so we can work on scheduling that surgery plus I would like a repeat AP pelvis at that visit.

## 2017-07-20 DIAGNOSIS — M1990 Unspecified osteoarthritis, unspecified site: Secondary | ICD-10-CM | POA: Diagnosis not present

## 2017-07-20 DIAGNOSIS — Z23 Encounter for immunization: Secondary | ICD-10-CM | POA: Diagnosis not present

## 2017-07-20 DIAGNOSIS — R030 Elevated blood-pressure reading, without diagnosis of hypertension: Secondary | ICD-10-CM | POA: Diagnosis not present

## 2017-07-20 DIAGNOSIS — F900 Attention-deficit hyperactivity disorder, predominantly inattentive type: Secondary | ICD-10-CM | POA: Diagnosis not present

## 2017-07-20 DIAGNOSIS — F3342 Major depressive disorder, recurrent, in full remission: Secondary | ICD-10-CM | POA: Diagnosis not present

## 2017-08-04 ENCOUNTER — Ambulatory Visit (INDEPENDENT_AMBULATORY_CARE_PROVIDER_SITE_OTHER): Payer: Medicare Other

## 2017-08-04 ENCOUNTER — Encounter (INDEPENDENT_AMBULATORY_CARE_PROVIDER_SITE_OTHER): Payer: Self-pay | Admitting: Orthopaedic Surgery

## 2017-08-04 ENCOUNTER — Ambulatory Visit (INDEPENDENT_AMBULATORY_CARE_PROVIDER_SITE_OTHER): Payer: Medicare Other | Admitting: Orthopaedic Surgery

## 2017-08-04 DIAGNOSIS — M1612 Unilateral primary osteoarthritis, left hip: Secondary | ICD-10-CM | POA: Diagnosis not present

## 2017-08-04 NOTE — Progress Notes (Signed)
  The patient is well-known to me.  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  She ambulates with a cane.  Her pain is daily and it is detrimentally affected x-rays daily living, quality of life, mobility.  Recent MRI showed profound edema in both femoral heads with a left-sided suspicion of the insufficiency fracture at this point.  Both hips are daily.  She is ablating using a cane.  She is a very thin individual.  She is tried and failed all forms of conservative treatment at this point wished to proceed with bilateral hip replacements.  We had a long and thorough discussion about this and showed her hip models and explained in detail with the surgery involved.  She had successful bilateral knee replacements 3 years ago and tolerated this very well.  She is a very healthy 66 year old very thin individual.  Examination both hips show severe pain with internal and external rotation and is quite significant.  Her ligaments are equal.  New x-rays today are obtained to make sure there is no further femoral head collapse and I do not see that but I do see severe osteoarthritis in both her hips.  Again I do feel that it would be appropriate to proceed with bilateral total hip arthroplasties.  We went over the surgery in detail including a thorough discussion of the risk and benefits of surgery and what her intraoperative and postoperative course were involved.  Given the MRI findings I am concerned that she could develop significant femoral head collapse in the near future without the surgery.  All questions and concerns were answered and addressed.  We will work on getting his surgery scheduled for January 2019.

## 2017-08-11 ENCOUNTER — Telehealth (INDEPENDENT_AMBULATORY_CARE_PROVIDER_SITE_OTHER): Payer: Self-pay | Admitting: Orthopaedic Surgery

## 2017-08-11 NOTE — Telephone Encounter (Signed)
Patient called wanting to schedule her hip replacement surgery for January and would like to speak with you. CB #  [phone]

## 2017-08-20 NOTE — Telephone Encounter (Signed)
I called patient and scheduled surgery. 

## 2017-08-30 ENCOUNTER — Other Ambulatory Visit (INDEPENDENT_AMBULATORY_CARE_PROVIDER_SITE_OTHER): Payer: Self-pay | Admitting: Physician Assistant

## 2017-09-04 NOTE — Patient Instructions (Addendum)
Becky JerichoRobin M Cunningham  09/04/2017   Your procedure is scheduled on: 09-18-16  [facility]  Entrance Report to Admitting at 8:45 AM   Call this number if you have problems the morning of surgery [phone]    Remember: Do not eat food or drink liquids :After Midnight.     Take these medicines the morning of surgery with A SIP OF WATER: Fluoxetine (Prozac)                                You may not have any metal on your body including hair pins and              piercings  Do not wear jewelry, make-up, lotions, powders or perfumes, deodorant             Do not wear nail polish.  Do not shave  48 hours prior to surgery.               Do not bring valuables to the hospital. [facility] IS NOT             RESPONSIBLE   FOR VALUABLES.  Contacts, dentures or bridgework may not be worn into surgery.  Leave suitcase in the car. After surgery it may be brought to your room.                 Please read over the following fact sheets you were given: _____________________________________________________________________             [facility] Pre[facility] Before surgery, you can play an important role.  Because skin is not sterile, your skin needs to be as free of germs as possible.  You can reduce the number of germs on your skin by washing with CHG (chlorahexidine gluconate) soap before surgery.  CHG is an antiseptic cleaner which kills germs and bonds with the skin to continue killing germs even after washing. Please DO NOT use if you have an allergy to CHG or antibacterial soaps.  If your skin becomes reddened/irritated stop using the CHG and inform your nurse when you arrive at Short Stay. Do not shave (including legs and underarms) for at least 48 hours prior to the first CHG shower.  You may shave your face/neck. Please follow these instructions carefully:  1.  Shower with CHG Soap the night before surgery and the  morning of Surgery.  2.  If  you choose to wash your hair, wash your hair first as usual with your  normal  shampoo.  3.  After you shampoo, rinse your hair and body thoroughly to remove the  shampoo.                           4.  Use CHG as you would any other liquid soap.  You can apply chg directly  to the skin and wash                       Gently with a scrungie or clean washcloth.  5.  Apply the CHG Soap to your body ONLY FROM THE NECK DOWN.   Do not use on face/ open  Wound or open sores. Avoid contact with eyes, ears mouth and genitals (private parts).                       Wash face,  Genitals (private parts) with your normal soap.             6.  Wash thoroughly, paying special attention to the area where your surgery  will be performed.  7.  Thoroughly rinse your body with warm water from the neck down.  8.  DO NOT shower/wash with your normal soap after using and rinsing off  the CHG Soap.                9.  Pat yourself dry with a clean towel.            10.  Wear clean pajamas.            11.  Place clean sheets on your bed the night of your first shower and do not  sleep with pets. Day of Surgery : Do not apply any lotions/deodorants the morning of surgery.  Please wear clean clothes to the hospital/surgery center.  FAILURE TO FOLLOW THESE INSTRUCTIONS MAY RESULT IN THE CANCELLATION OF YOUR SURGERY PATIENT SIGNATURE_________________________________  NURSE SIGNATURE__________________________________  ________________________________________________________________________   Adam Phenix  An incentive spirometer is a tool that can help keep your lungs clear and active. This tool measures how well you are filling your lungs with each breath. Taking long deep breaths may help reverse or decrease the chance of developing breathing (pulmonary) problems (especially infection) following:  A long period of time when you are unable to move or be active. BEFORE THE PROCEDURE    If the spirometer includes an indicator to show your best effort, your nurse or respiratory therapist will set it to a desired goal.  If possible, sit up straight or lean slightly forward. Try not to slouch.  Hold the incentive spirometer in an upright position. INSTRUCTIONS FOR USE  1. Sit on the edge of your bed if possible, or sit up as far as you can in bed or on a chair. 2. Hold the incentive spirometer in an upright position. 3. Breathe out normally. 4. Place the mouthpiece in your mouth and seal your lips tightly around it. 5. Breathe in slowly and as deeply as possible, raising the piston or the ball toward the top of the column. 6. Hold your breath for 3-5 seconds or for as long as possible. Allow the piston or ball to fall to the bottom of the column. 7. Remove the mouthpiece from your mouth and breathe out normally. 8. Rest for a few seconds and repeat Steps 1 through 7 at least 10 times every 1-2 hours when you are awake. Take your time and take a few normal breaths between deep breaths. 9. The spirometer may include an indicator to show your best effort. Use the indicator as a goal to work toward during each repetition. 10. After each set of 10 deep breaths, practice coughing to be sure your lungs are clear. If you have an incision (the cut made at the time of surgery), support your incision when coughing by placing a pillow or rolled up towels firmly against it. Once you are able to get out of bed, walk around indoors and cough well. You may stop using the incentive spirometer when instructed by your caregiver.  RISKS AND COMPLICATIONS  Take your time so you do not get  dizzy or light-headed.  If you are in pain, you may need to take or ask for pain medication before doing incentive spirometry. It is harder to take a deep breath if you are having pain. AFTER USE  Rest and breathe slowly and easily.  It can be helpful to keep track of a log of your progress. Your caregiver  can provide you with a simple table to help with this. If you are using the spirometer at home, follow these instructions: [place] IF:   You are having difficultly using the spirometer.  You have trouble using the spirometer as often as instructed.  Your pain medication is not giving enough relief while using the spirometer.  You develop fever of 100.5 F (38.1 C) or higher. SEEK IMMEDIATE MEDICAL CARE IF:   You cough up bloody sputum that had not been present before.  You develop fever of 102 F (38.9 C) or greater.  You develop worsening pain at or near the incision site. MAKE SURE YOU:   Understand these instructions.  Will watch your condition.  Will get help right away if you are not doing well or get worse. Document Released: 01/05/2007 Document Revised: 11/17/2011 Document Reviewed: 03/08/2007 Exit[employer] Patient Information 2014 Exit[employer], [place].   ________________________________________________________________________  WHAT IS A BLOOD TRANSFUSION? Blood Transfusion Information  A transfusion is the replacement of blood or some of its parts. Blood is made up of multiple cells which provide different functions.  Red blood cells carry oxygen and are used for blood loss replacement.  White blood cells fight against infection.  Platelets control bleeding.  Plasma helps clot blood.  Other blood products are available for specialized needs, such as hemophilia or other clotting disorders. BEFORE THE TRANSFUSION  Who gives blood for transfusions?   Healthy volunteers who are fully evaluated to make sure their blood is safe. This is blood bank blood. Transfusion therapy is the safest it has ever been in the practice of medicine. Before blood is taken from a donor, a complete history is taken to make sure that person has no history of diseases nor engages in risky social behavior (examples are intravenous drug use or sexual activity with multiple partners). The  donor's travel history is screened to minimize risk of transmitting infections, such as malaria. The donated blood is tested for signs of infectious diseases, such as HIV and hepatitis. The blood is then tested to be sure it is compatible with you in order to minimize the chance of a transfusion reaction. If you or a relative donates blood, this is often done in anticipation of surgery and is not appropriate for emergency situations. It takes many days to process the donated blood. RISKS AND COMPLICATIONS Although transfusion therapy is very safe and saves many lives, the main dangers of transfusion include:   Getting an infectious disease.  Developing a transfusion reaction. This is an allergic reaction to something in the blood you were given. Every precaution is taken to prevent this. The decision to have a blood transfusion has been considered carefully by your caregiver before blood is given. Blood is not given unless the benefits outweigh the risks. AFTER THE TRANSFUSION  Right after receiving a blood transfusion, you will usually feel much better and more energetic. This is especially true if your red blood cells have gotten low (anemic). The transfusion raises the level of the red blood cells which carry oxygen, and this usually causes an energy increase.  The nurse administering the transfusion will  monitor you carefully for complications. HOME CARE INSTRUCTIONS  No special instructions are needed after a transfusion. You may find your energy is better. Speak with your caregiver about any limitations on activity for underlying diseases you may have. SEEK MEDICAL CARE IF:   Your condition is not improving after your transfusion.  You develop redness or irritation at the intravenous (IV) site. SEEK IMMEDIATE MEDICAL CARE IF:  Any of the following symptoms occur over the next 12 hours:  Shaking chills.  You have a temperature by mouth above 102 F (38.9 C), not controlled by  medicine.  Chest, back, or muscle pain.  People around you feel you are not acting correctly or are confused.  Shortness of breath or difficulty breathing.  Dizziness and fainting.  You get a rash or develop hives.  You have a decrease in urine output.  Your urine turns a dark color or changes to pink, red, or brown. Any of the following symptoms occur over the next 10 days:  You have a temperature by mouth above 102 F (38.9 C), not controlled by medicine.  Shortness of breath.  Weakness after normal activity.  The white part of the eye turns yellow (jaundice).  You have a decrease in the amount of urine or are urinating less often.  Your urine turns a dark color or changes to pink, red, or brown. Document Released: 08/22/2000 Document Revised: 11/17/2011 Document Reviewed: 04/10/2008 [facility] Patient Information 2014 [place], [place].  _______________________________________________________________________

## 2017-09-07 ENCOUNTER — Encounter (HOSPITAL_COMMUNITY)
Admission: RE | Admit: 2017-09-07 | Discharge: 2017-09-07 | Disposition: A | Discharge status: Home / Self Care | Payer: Medicare Other | Source: Ambulatory Visit | Attending: Orthopaedic Surgery | Admitting: Orthopaedic Surgery

## 2017-09-07 ENCOUNTER — Encounter (HOSPITAL_COMMUNITY): Payer: Self-pay

## 2017-09-07 ENCOUNTER — Other Ambulatory Visit: Payer: Self-pay

## 2017-09-07 DIAGNOSIS — Z01818 Encounter for other preprocedural examination: Secondary | ICD-10-CM | POA: Diagnosis not present

## 2017-09-07 DIAGNOSIS — M160 Bilateral primary osteoarthritis of hip: Secondary | ICD-10-CM | POA: Diagnosis not present

## 2017-09-07 LAB — CBC
HEMATOCRIT: 39.4 % (ref 36.0–46.0)
HEMOGLOBIN: 13.3 g/dL (ref 12.0–15.0)
MCH: 30.6 pg (ref 26.0–34.0)
MCHC: 33.8 g/dL (ref 30.0–36.0)
MCV: 90.6 fL (ref 78.0–100.0)
Platelets: 280 10*3/uL (ref 150–400)
RBC: 4.35 MIL/uL (ref 3.87–5.11)
RDW: 13.8 % (ref 11.5–15.5)
WBC: 5.2 10*3/uL (ref 4.0–10.5)

## 2017-09-07 LAB — BASIC METABOLIC PANEL
Anion gap: 8 (ref 5–15)
BUN: 19 mg/dL (ref 6–20)
CALCIUM: 9.8 mg/dL (ref 8.9–10.3)
CO2: 29 mmol/L (ref 22–32)
CREATININE: 0.69 mg/dL (ref 0.44–1.00)
Chloride: 101 mmol/L (ref 101–111)
GFR calc non Af Amer: >60 mL/min (ref >60)
Glucose, Bld: 90 mg/dL (ref 65–99)
Potassium: 3.6 mmol/L (ref 3.5–5.1)
SODIUM: 138 mmol/L (ref 135–145)

## 2017-09-07 LAB — SURGICAL PCR SCREEN
MRSA, PCR: NEGATIVE
STAPHYLOCOCCUS AUREUS: NEGATIVE

## 2017-09-08 DIAGNOSIS — Z9289 Personal history of other medical treatment: Secondary | ICD-10-CM

## 2017-09-08 HISTORY — DX: Personal history of other medical treatment: Z92.89

## 2017-09-17 MED ORDER — TRANEXAMIC ACID 1000 MG/10ML IV SOLN
1000.0000 mg | INTRAVENOUS | Status: AC
  Administered 2017-09-18: 1000 mg via INTRAVENOUS
  Filled 2017-09-17: qty 1100

## 2017-09-18 ENCOUNTER — Inpatient Hospital Stay (HOSPITAL_COMMUNITY): Payer: Medicare Other

## 2017-09-18 ENCOUNTER — Encounter (HOSPITAL_COMMUNITY): Payer: Self-pay | Admitting: Certified Registered"

## 2017-09-18 ENCOUNTER — Encounter (HOSPITAL_COMMUNITY)
Admission: RE | Disposition: A | Discharge status: Home Health | Payer: Self-pay | Source: Ambulatory Visit | Attending: Orthopaedic Surgery

## 2017-09-18 ENCOUNTER — Inpatient Hospital Stay (HOSPITAL_COMMUNITY)
Admission: RE | Admit: 2017-09-18 | Discharge: 2017-09-22 | DRG: 462 | Disposition: A | Discharge status: Home Health | Payer: Medicare Other | Source: Ambulatory Visit | Attending: Orthopaedic Surgery | Admitting: Orthopaedic Surgery

## 2017-09-18 ENCOUNTER — Inpatient Hospital Stay (HOSPITAL_COMMUNITY): Payer: Medicare Other | Admitting: Certified Registered"

## 2017-09-18 ENCOUNTER — Other Ambulatory Visit: Payer: Self-pay

## 2017-09-18 DIAGNOSIS — M25551 Pain in right hip: Secondary | ICD-10-CM | POA: Diagnosis not present

## 2017-09-18 DIAGNOSIS — M1612 Unilateral primary osteoarthritis, left hip: Secondary | ICD-10-CM

## 2017-09-18 DIAGNOSIS — M160 Bilateral primary osteoarthritis of hip: Principal | ICD-10-CM

## 2017-09-18 DIAGNOSIS — Z419 Encounter for procedure for purposes other than remedying health state, unspecified: Secondary | ICD-10-CM

## 2017-09-18 DIAGNOSIS — M1611 Unilateral primary osteoarthritis, right hip: Secondary | ICD-10-CM | POA: Diagnosis not present

## 2017-09-18 DIAGNOSIS — Z471 Aftercare following joint replacement surgery: Secondary | ICD-10-CM | POA: Diagnosis not present

## 2017-09-18 DIAGNOSIS — Z96643 Presence of artificial hip joint, bilateral: Secondary | ICD-10-CM

## 2017-09-18 DIAGNOSIS — F329 Major depressive disorder, single episode, unspecified: Secondary | ICD-10-CM | POA: Diagnosis not present

## 2017-09-18 DIAGNOSIS — Z96653 Presence of artificial knee joint, bilateral: Secondary | ICD-10-CM | POA: Diagnosis not present

## 2017-09-18 DIAGNOSIS — Z91018 Allergy to other foods: Secondary | ICD-10-CM

## 2017-09-18 DIAGNOSIS — D62 Acute posthemorrhagic anemia: Secondary | ICD-10-CM | POA: Diagnosis not present

## 2017-09-18 HISTORY — PX: BILATERAL ANTERIOR TOTAL HIP ARTHROPLASTY: SHX5567

## 2017-09-18 SURGERY — ARTHROPLASTY, HIP, BILATERAL, TOTAL, ANTERIOR APPROACH
Anesthesia: Spinal | Site: Hip | Laterality: Bilateral

## 2017-09-18 MED ORDER — GABAPENTIN 100 MG PO CAPS
100.0000 mg | ORAL_CAPSULE | Freq: Three times a day (TID) | ORAL | Status: DC
  Administered 2017-09-18 – 2017-09-22 (×12): 100 mg via ORAL
  Filled 2017-09-18 (×12): qty 1

## 2017-09-18 MED ORDER — METOCLOPRAMIDE HCL 5 MG/ML IJ SOLN: 5.0000 mg | Freq: Three times a day (TID) | INTRAMUSCULAR | Status: DC | PRN

## 2017-09-18 MED ORDER — PHENOL 1.4 % MT LIQD: 1.0000 | OROMUCOSAL | Status: DC | PRN

## 2017-09-18 MED ORDER — FENTANYL CITRATE (PF) 100 MCG/2ML IJ SOLN
25.0000 ug | INTRAMUSCULAR | Status: DC | PRN
  Administered 2017-09-18: 50 ug via INTRAVENOUS

## 2017-09-18 MED ORDER — FLUOXETINE HCL 20 MG PO CAPS
60.0000 mg | ORAL_CAPSULE | Freq: Every day | ORAL | Status: DC
  Administered 2017-09-19 – 2017-09-22 (×4): 60 mg via ORAL
  Filled 2017-09-18 (×4): qty 3

## 2017-09-18 MED ORDER — VITAMIN C 500 MG PO TABS
500.0000 mg | ORAL_TABLET | Freq: Two times a day (BID) | ORAL | Status: DC
  Administered 2017-09-18 – 2017-09-22 (×8): 500 mg via ORAL
  Filled 2017-09-18 (×8): qty 1

## 2017-09-18 MED ORDER — MIDAZOLAM HCL 2 MG/2ML IJ SOLN
INTRAMUSCULAR | Status: DC | PRN
  Administered 2017-09-18 (×2): 1 mg via INTRAVENOUS

## 2017-09-18 MED ORDER — AMPHETAMINE-DEXTROAMPHETAMINE 10 MG PO TABS: 40.0000 mg | ORAL_TABLET | ORAL | Status: DC

## 2017-09-18 MED ORDER — PROPOFOL 500 MG/50ML IV EMUL
INTRAVENOUS | Status: DC | PRN
  Administered 2017-09-18: 65 ug/kg/min via INTRAVENOUS

## 2017-09-18 MED ORDER — POLYETHYLENE GLYCOL 3350 17 G PO PACK: 17.0000 g | PACK | Freq: Every day | ORAL | Status: DC | PRN

## 2017-09-18 MED ORDER — ACETAMINOPHEN 650 MG RE SUPP: 650.0000 mg | RECTAL | Status: DC | PRN

## 2017-09-18 MED ORDER — SODIUM CHLORIDE 0.9 % IR SOLN
Status: DC | PRN
  Administered 2017-09-18 (×2): 1000 mL

## 2017-09-18 MED ORDER — MENTHOL 3 MG MT LOZG: 1.0000 | LOZENGE | OROMUCOSAL | Status: DC | PRN

## 2017-09-18 MED ORDER — FENTANYL CITRATE (PF) 250 MCG/5ML IJ SOLN
INTRAMUSCULAR | Status: DC | PRN
  Administered 2017-09-18 (×4): 50 ug via INTRAVENOUS

## 2017-09-18 MED ORDER — HYDROMORPHONE HCL 1 MG/ML IJ SOLN
1.0000 mg | INTRAMUSCULAR | Status: DC | PRN
Start: 2017-09-18 — End: 2017-09-22

## 2017-09-18 MED ORDER — METHOCARBAMOL 500 MG PO TABS
500.0000 mg | ORAL_TABLET | Freq: Four times a day (QID) | ORAL | Status: DC | PRN
  Administered 2017-09-19 – 2017-09-22 (×9): 500 mg via ORAL
  Filled 2017-09-18 (×9): qty 1

## 2017-09-18 MED ORDER — LACTATED RINGERS IV SOLN
INTRAVENOUS | Status: DC
  Administered 2017-09-18 (×2): via INTRAVENOUS

## 2017-09-18 MED ORDER — ALUM & MAG HYDROXIDE-SIMETH 200-200-20 MG/5ML PO SUSP: 30.0000 mL | ORAL | Status: DC | PRN

## 2017-09-18 MED ORDER — PROPOFOL 10 MG/ML IV BOLUS
INTRAVENOUS | Status: AC
  Filled 2017-09-18: qty 40

## 2017-09-18 MED ORDER — CEFAZOLIN SODIUM-DEXTROSE 2-4 GM/100ML-% IV SOLN
2.0000 g | INTRAVENOUS | Status: AC
  Administered 2017-09-18: 2 g via INTRAVENOUS
  Filled 2017-09-18: qty 100

## 2017-09-18 MED ORDER — ACETAMINOPHEN 325 MG PO TABS: 650.0000 mg | ORAL_TABLET | ORAL | Status: DC | PRN

## 2017-09-18 MED ORDER — OXYCODONE HCL 5 MG PO TABS: 10.0000 mg | ORAL_TABLET | ORAL | Status: DC | PRN

## 2017-09-18 MED ORDER — PHENYLEPHRINE 40 MCG/ML (10ML) SYRINGE FOR IV PUSH (FOR BLOOD PRESSURE SUPPORT)
PREFILLED_SYRINGE | INTRAVENOUS | Status: DC | PRN
  Administered 2017-09-18 (×3): 80 ug via INTRAVENOUS

## 2017-09-18 MED ORDER — ONDANSETRON HCL 4 MG PO TABS: 4.0000 mg | ORAL_TABLET | Freq: Four times a day (QID) | ORAL | Status: DC | PRN

## 2017-09-18 MED ORDER — ONDANSETRON HCL 4 MG/2ML IJ SOLN: 4.0000 mg | Freq: Once | INTRAMUSCULAR | Status: DC | PRN

## 2017-09-18 MED ORDER — PHENYLEPHRINE HCL 10 MG/ML IJ SOLN
INTRAMUSCULAR | Status: DC | PRN
  Administered 2017-09-18: 25 ug/min via INTRAVENOUS

## 2017-09-18 MED ORDER — LIDOCAINE 2% (20 MG/ML) 5 ML SYRINGE
INTRAMUSCULAR | Status: DC | PRN
  Administered 2017-09-18: 60 mg via INTRAVENOUS

## 2017-09-18 MED ORDER — BUPIVACAINE HCL (PF) 0.5 % IJ SOLN
INTRAMUSCULAR | Status: AC
  Filled 2017-09-18: qty 30

## 2017-09-18 MED ORDER — ONDANSETRON HCL 4 MG/2ML IJ SOLN: 4.0000 mg | Freq: Four times a day (QID) | INTRAMUSCULAR | Status: DC | PRN

## 2017-09-18 MED ORDER — MAGNESIUM OXIDE 400 (241.3 MG) MG PO TABS
400.0000 mg | ORAL_TABLET | Freq: Every day | ORAL | Status: DC
  Administered 2017-09-19 – 2017-09-22 (×4): 400 mg via ORAL
  Filled 2017-09-18 (×6): qty 1

## 2017-09-18 MED ORDER — CHLORHEXIDINE GLUCONATE 4 % EX LIQD: 60.0000 mL | Freq: Once | CUTANEOUS | Status: DC

## 2017-09-18 MED ORDER — FENTANYL CITRATE (PF) 250 MCG/5ML IJ SOLN
INTRAMUSCULAR | Status: AC
  Filled 2017-09-18: qty 5

## 2017-09-18 MED ORDER — DOCUSATE SODIUM 100 MG PO CAPS
100.0000 mg | ORAL_CAPSULE | Freq: Two times a day (BID) | ORAL | Status: DC
  Administered 2017-09-18 – 2017-09-22 (×8): 100 mg via ORAL
  Filled 2017-09-18 (×8): qty 1

## 2017-09-18 MED ORDER — STERILE WATER FOR IRRIGATION IR SOLN
Status: DC | PRN
  Administered 2017-09-18: 2000 mL

## 2017-09-18 MED ORDER — MIDAZOLAM HCL 2 MG/2ML IJ SOLN
INTRAMUSCULAR | Status: AC
  Filled 2017-09-18: qty 2

## 2017-09-18 MED ORDER — DEXAMETHASONE SODIUM PHOSPHATE 10 MG/ML IJ SOLN
INTRAMUSCULAR | Status: DC | PRN
  Administered 2017-09-18: 10 mg via INTRAVENOUS

## 2017-09-18 MED ORDER — PROPOFOL 10 MG/ML IV BOLUS
INTRAVENOUS | Status: AC
  Filled 2017-09-18: qty 20

## 2017-09-18 MED ORDER — PROPOFOL 10 MG/ML IV BOLUS
INTRAVENOUS | Status: DC | PRN
  Administered 2017-09-18: 180 mg via INTRAVENOUS
  Administered 2017-09-18: 20 mg via INTRAVENOUS

## 2017-09-18 MED ORDER — HYDROCODONE-ACETAMINOPHEN 5-325 MG PO TABS
1.0000 | ORAL_TABLET | ORAL | Status: DC | PRN
  Administered 2017-09-18 – 2017-09-19 (×2): 1 via ORAL
  Administered 2017-09-19: 2 via ORAL
  Administered 2017-09-19: 1 via ORAL
  Administered 2017-09-19 – 2017-09-20 (×2): 2 via ORAL
  Administered 2017-09-20 – 2017-09-21 (×3): 1 via ORAL
  Administered 2017-09-21 (×3): 2 via ORAL
  Administered 2017-09-22: 1 via ORAL
  Administered 2017-09-22: 06:00:00 2 via ORAL
  Filled 2017-09-18: qty 1
  Filled 2017-09-18 (×6): qty 2
  Filled 2017-09-18: qty 1
  Filled 2017-09-18: qty 2
  Filled 2017-09-18: qty 1
  Filled 2017-09-18 (×2): qty 2
  Filled 2017-09-18 (×2): qty 1

## 2017-09-18 MED ORDER — SODIUM CHLORIDE 0.9 % IV SOLN
INTRAVENOUS | Status: DC
  Administered 2017-09-18 – 2017-09-19 (×3): via INTRAVENOUS

## 2017-09-18 MED ORDER — METHOCARBAMOL 1000 MG/10ML IJ SOLN
500.0000 mg | Freq: Four times a day (QID) | INTRAVENOUS | Status: DC | PRN
  Administered 2017-09-18: 18:00:00 500 mg via INTRAVENOUS
  Filled 2017-09-18: qty 550

## 2017-09-18 MED ORDER — EPHEDRINE 5 MG/ML INJ
INTRAVENOUS | Status: AC
  Filled 2017-09-18: qty 10

## 2017-09-18 MED ORDER — FENTANYL CITRATE (PF) 100 MCG/2ML IJ SOLN
INTRAMUSCULAR | Status: AC
  Administered 2017-09-18: 50 ug via INTRAVENOUS
  Filled 2017-09-18: qty 4

## 2017-09-18 MED ORDER — DIPHENHYDRAMINE HCL 12.5 MG/5ML PO ELIX: 12.5000 mg | ORAL_SOLUTION | ORAL | Status: DC | PRN

## 2017-09-18 MED ORDER — B COMPLEX-C PO TABS
1.0000 | ORAL_TABLET | ORAL | Status: DC
  Administered 2017-09-21: 08:00:00 1 via ORAL
  Filled 2017-09-18 (×2): qty 1

## 2017-09-18 MED ORDER — METOCLOPRAMIDE HCL 5 MG PO TABS: 5.0000 mg | ORAL_TABLET | Freq: Three times a day (TID) | ORAL | Status: DC | PRN

## 2017-09-18 MED ORDER — FERROUS SULFATE 325 (65 FE) MG PO TABS
325.0000 mg | ORAL_TABLET | Freq: Every day | ORAL | Status: DC
  Administered 2017-09-19 – 2017-09-22 (×4): 325 mg via ORAL
  Filled 2017-09-18 (×4): qty 1

## 2017-09-18 MED ORDER — ONDANSETRON HCL 4 MG/2ML IJ SOLN
INTRAMUSCULAR | Status: DC | PRN
  Administered 2017-09-18: 4 mg via INTRAVENOUS

## 2017-09-18 MED ORDER — CEFAZOLIN SODIUM-DEXTROSE 1-4 GM/50ML-% IV SOLN
1.0000 g | Freq: Four times a day (QID) | INTRAVENOUS | Status: AC
  Administered 2017-09-18 (×2): 1 g via INTRAVENOUS
  Filled 2017-09-18 (×2): qty 50

## 2017-09-18 MED ORDER — 0.9 % SODIUM CHLORIDE (POUR BTL) OPTIME
TOPICAL | Status: DC | PRN
  Administered 2017-09-18: 1000 mL

## 2017-09-18 MED ORDER — ASPIRIN 81 MG PO CHEW
81.0000 mg | CHEWABLE_TABLET | Freq: Two times a day (BID) | ORAL | Status: DC
  Administered 2017-09-18 – 2017-09-22 (×8): 81 mg via ORAL
  Filled 2017-09-18 (×8): qty 1

## 2017-09-18 SURGICAL SUPPLY — 41 items
BAG ZIPLOCK 12X15 (MISCELLANEOUS) ×2 IMPLANT
BENZOIN TINCTURE PRP APPL 2/3 (GAUZE/BANDAGES/DRESSINGS) ×4 IMPLANT
BLADE SAW SGTL 18X1.27X75 (BLADE) ×4 IMPLANT
BLADE SURG SZ10 CARB STEEL (BLADE) ×4 IMPLANT
CAPT HIP TOTAL 2 ×4 IMPLANT
CELLS DAT CNTRL 66122 CELL SVR (MISCELLANEOUS) ×2 IMPLANT
CLOTH BEACON ORANGE TIMEOUT ST (SAFETY) ×2 IMPLANT
COVER PERINEAL POST (MISCELLANEOUS) ×2 IMPLANT
COVER SURGICAL LIGHT HANDLE (MISCELLANEOUS) ×4 IMPLANT
DRAPE C-ARM 42X120 X-RAY (DRAPES) ×4 IMPLANT
DRAPE STERI IOBAN 125X83 (DRAPES) ×4 IMPLANT
DRAPE U-SHAPE 47X51 STRL (DRAPES) ×10 IMPLANT
DRSG AQUACEL AG ADV 3.5X10 (GAUZE/BANDAGES/DRESSINGS) ×4 IMPLANT
DRSG TEGADERM 4X4.75 (GAUZE/BANDAGES/DRESSINGS) ×2 IMPLANT
DURAPREP 26ML APPLICATOR (WOUND CARE) ×4 IMPLANT
ELECT BLADE TIP CTD 4 INCH (ELECTRODE) ×4 IMPLANT
ELECT PENCIL ROCKER SW 15FT (MISCELLANEOUS) IMPLANT
ELECT REM PT RETURN 15FT ADLT (MISCELLANEOUS) ×4 IMPLANT
FACESHIELD WRAPAROUND (MASK) ×16 IMPLANT
GLOVE BIO SURGEON STRL SZ7.5 (GLOVE) ×4 IMPLANT
GLOVE BIOGEL PI IND STRL 8 (GLOVE) ×4 IMPLANT
GLOVE BIOGEL PI INDICATOR 8 (GLOVE) ×4
GLOVE ECLIPSE 8.0 STRL XLNG CF (GLOVE) ×4 IMPLANT
GOWN STRL REUS W/TWL XL LVL3 (GOWN DISPOSABLE) ×4 IMPLANT
HANDPIECE INTERPULSE COAX TIP (DISPOSABLE) ×2
MARKER SKIN DUAL TIP RULER LAB (MISCELLANEOUS) ×2 IMPLANT
PACK ANTERIOR HIP CUSTOM (KITS) ×2 IMPLANT
PACK UNIVERSAL I (CUSTOM PROCEDURE TRAY) IMPLANT
RTRCTR WOUND ALEXIS 18CM MED (MISCELLANEOUS) ×4
SET HNDPC FAN SPRY TIP SCT (DISPOSABLE) ×2 IMPLANT
SPONGE LAP 18X18 X RAY DECT (DISPOSABLE) ×6 IMPLANT
STRIP CLOSURE SKIN 1/2X4 (GAUZE/BANDAGES/DRESSINGS) ×4 IMPLANT
SUT ETHIBOND NAB CT1 #1 30IN (SUTURE) ×4 IMPLANT
SUT MNCRL AB 4-0 PS2 18 (SUTURE) ×4 IMPLANT
SUT VIC AB 0 CT1 36 (SUTURE) ×4 IMPLANT
SUT VIC AB 1 CT1 36 (SUTURE) ×4 IMPLANT
SUT VIC AB 2-0 CT1 27 (SUTURE) ×2
SUT VIC AB 2-0 CT1 TAPERPNT 27 (SUTURE) ×2 IMPLANT
TOWEL OR 17X26 10 PK STRL BLUE (TOWEL DISPOSABLE) IMPLANT
TRAY FOLEY CATH 14FR (SET/KITS/TRAYS/PACK) ×2 IMPLANT
YANKAUER SUCT BULB TIP 10FT TU (MISCELLANEOUS) ×2 IMPLANT

## 2017-09-18 NOTE — Anesthesia Preprocedure Evaluation (Addendum)
Anesthesia Evaluation  Patient identified by MRN, date of birth, ID band Patient awake    Reviewed: Allergy & Precautions, H&P , NPO status , Patient's Chart, lab work & pertinent test results  Airway Mallampati: II  TM Distance: >3 FB Neck ROM: Full    Dental no notable dental hx. (+) Dental Advisory Given   Pulmonary neg pulmonary ROS,    Pulmonary exam normal breath sounds clear to auscultation       Cardiovascular negative cardio ROS Normal cardiovascular exam Rhythm:Regular Rate:Normal     Neuro/Psych PSYCHIATRIC DISORDERS Depression negative neurological ROS     GI/Hepatic negative GI ROS, Neg liver ROS,   Endo/Other  negative endocrine ROS  Renal/GU negative Renal ROS     Musculoskeletal  (+) Arthritis , Osteoarthritis,    Abdominal   Peds  Hematology negative hematology ROS (+)   Anesthesia Other Findings bilateral hip osteoarthritis  Reproductive/Obstetrics                            Anesthesia Physical  Anesthesia Plan  ASA: II  Anesthesia Plan: Spinal   Post-op Pain Management:    Induction: Intravenous  PONV Risk Score and Plan: 2 and Ondansetron, Dexamethasone, Midazolam and Treatment may vary due to age or medical condition  Airway Management Planned: Natural Airway  Additional Equipment:   Intra-op Plan:   Post-operative Plan:   Informed Consent: I have reviewed the patients History and Physical, chart, labs and discussed the procedure including the risks, benefits and alternatives for the proposed anesthesia with the patient or authorized representative who has indicated his/her understanding and acceptance.   Dental advisory given  Plan Discussed with: CRNA  Anesthesia Plan Comments:         Anesthesia Quick Evaluation

## 2017-09-18 NOTE — Anesthesia Procedure Notes (Signed)
Date/Time: 09/18/2017 1:35 PM Performed by: Minerva EndsMirarchi, Makell Cyr M, CRNA Pre-anesthesia Checklist: Patient identified, Emergency Drugs available, Suction available and Patient being monitored Oxygen Delivery Method: Simple face mask Placement Confirmation: positive ETCO2 and breath sounds checked- equal and bilateral Dental Injury: Teeth and Oropharynx as per pre-operative assessment  Comments: LMA removed to face mask

## 2017-09-18 NOTE — H&P (Signed)
TOTAL HIP ADMISSION H&P  Patient is admitted for bilaterally total hip arthroplasty.  Subjective:  Chief Complaint: bilaterally hip pain  HPI: Becky Cunningham, 67 y.o. female, has a history of pain and functional disability in the bilaterally hip(s) due to arthritis and patient has failed non-surgical conservative treatments for greater than 12 weeks to include NSAID's and/or analgesics, corticosteriod injections, flexibility and strengthening excercises, use of assistive devices and activity modification.  Onset of symptoms was gradual starting 2 years ago with gradually worsening course since that time.The patient noted no past surgery on the bilaterally hip(s).  Patient currently rates pain in the bilaterally hip at 10 out of 10 with activity. Patient has night pain, worsening of pain with activity and weight bearing, trendelenberg gait, pain that interfers with activities of daily living and pain with passive range of motion. Patient has evidence of subchondral cysts, subchondral sclerosis, periarticular osteophytes and joint space narrowing by imaging studies. This condition presents safety issues increasing the risk of falls.  There is no current active infection.  Patient Active Problem List   Diagnosis Date Noted  . Bilateral primary osteoarthritis of hip 09/18/2017  . Unilateral primary osteoarthritis, left hip 07/07/2017  . Unilateral primary osteoarthritis, right hip 07/07/2017  . Pain in left wrist 03/30/2017  . Primary osteoarthritis of both knees 08/25/2014  . Status post bilateral knee replacements 08/25/2014   Past Medical History:  Diagnosis Date  . Arthritis   . Depression     Past Surgical History:  Procedure Laterality Date  . DILATION AND CURETTAGE OF UTERUS    . KNEE ARTHROSCOPY  2009   left   . TMJ similar surgery     . TOTAL KNEE ARTHROPLASTY Bilateral 08/25/2014   Procedure: TOTAL KNEE BILATERAL;  Surgeon: Kathryne Hitch, MD;  Location: [facility];  Service:  Orthopedics;  Laterality: Bilateral;  . WISDOM TOOTH EXTRACTION      Current Facility-Administered Medications  Medication Dose Route Frequency Provider Last Rate Last Dose  . tranexamic acid (CYKLOKAPRON) 1,000 mg in sodium chloride 0.9 % 100 mL IVPB  1,000 mg Intravenous To OR Kirtland Bouchard, PA-C       Current Outpatient Medications  Medication Sig Dispense Refill Last Dose  . amphetamine-dextroamphetamine (ADDERALL) 20 MG tablet Take 40 mg by mouth See admin instructions. Take 2 tablet (40 mg)  in the morning and 1 tablet(20 mg) at lunch as needed for focus during work week.   Taking  . b complex vitamins tablet Take 1 tablet by mouth every other day.     . Calcium Carb-Cholecalciferol (CALCIUM 600 + D PO) Take 1 tablet by mouth 2 (two) times daily.     . diclofenac (VOLTAREN) 50 MG EC tablet Take 50 mg by mouth 3 (three) times daily.     Marland Kitchen DIGESTIVE ENZYMES PO Take 1 capsule by mouth daily. Probiotic Digestive Enzyme.     . docusate sodium 100 MG CAPS Take 100 mg by mouth 2 (two) times daily. (Patient not taking: Reported on 09/07/2017) 10 capsule 0 Not Taking at Unknown time  . ferrous sulfate 325 (65 FE) MG tablet Take 1 tablet (325 mg total) by mouth 3 (three) times daily with meals. (Patient taking differently: Take 325 mg by mouth daily. ) 60 tablet 0 Taking  . FLUoxetine (PROZAC) 20 MG capsule Take 60 mg by mouth daily.    Taking  . Magnesium 500 MG TABS Take 500 mg by mouth daily.     . Misc  Natural Products (TART CHERRY ADVANCED PO) Take 1,200 mg by mouth daily with lunch.     . Probiotic Product (PROBIOTIC PO) Take 1 scoop by mouth daily. Probiotic Shake--"Natural Vitality"    Taking  . Probiotic Product (PROBIOTIC PO) Take 1 scoop by mouth daily. Green Fusion. --shake    Taking  . vitamin C (ASCORBIC ACID) 500 MG tablet Take 500 mg by mouth 2 (two) times daily.      Allergies  Allergen Reactions  . Other Other (See Comments)    Sugar enzymes--Throat swelling/scratchy. (when  eats something really sweet) with melons     Social History   Tobacco Use  . Smoking status: Never Smoker  . Smokeless tobacco: Never Used  Substance Use Topics  . Alcohol use: Yes    Alcohol/week: 8.4 oz    Types: 14 Glasses of wine per week    History reviewed. No pertinent family history.   Review of Systems  Musculoskeletal: Positive for joint pain.  All other systems reviewed and are negative.   Objective:  Physical Exam  Constitutional: She is oriented to person, place, and time. She appears well-developed and well-nourished.  HENT:  Head: Normocephalic and atraumatic.  Eyes: EOM are normal. Pupils are equal, round, and reactive to light.  Neck: Normal range of motion. Neck supple.  Cardiovascular: Normal rate and regular rhythm.  Respiratory: Effort normal and breath sounds normal.  GI: Soft. Bowel sounds are normal.  Musculoskeletal:       Right hip: She exhibits decreased range of motion, decreased strength, tenderness and bony tenderness.       Left hip: She exhibits decreased range of motion, decreased strength and tenderness.  Neurological: She is alert and oriented to person, place, and time.  Skin: Skin is warm and dry.  Psychiatric: She has a normal mood and affect.    Vital signs in last 24 hours:    Labs:   Estimated body mass index is 20.03 kg/m as calculated from the following:   Height as of 09/07/17: 5\' 6"  (1.676 m).   Weight as of 09/07/17: 124 lb 2 oz (56.3 kg).   Imaging Review Plain radiographs demonstrate severe degenerative joint disease of the bilateral hip(s). The bone quality appears to be good for age and reported activity level.  Assessment/Plan:  End stage arthritis, bilaterally hip(s)  The patient history, physical examination, clinical judgement of the provider and imaging studies are consistent with end stage degenerative joint disease of the bilaterally hip(s) and total hip arthroplasty is deemed medically necessary. The  treatment options including medical management, injection therapy, arthroscopy and arthroplasty were discussed at length. The risks and benefits of total hip arthroplasty were presented and reviewed. The risks due to aseptic loosening, infection, stiffness, dislocation/subluxation,  thromboembolic complications and other imponderables were discussed.  The patient acknowledged the explanation, agreed to proceed with the plan and consent was signed. Patient is being admitted for inpatient treatment for surgery, pain control, PT, OT, prophylactic antibiotics, VTE prophylaxis, progressive ambulation and ADL's and discharge planning.The patient is planning to be discharged to be determined

## 2017-09-18 NOTE — Anesthesia Procedure Notes (Signed)
Spinal  Patient location during procedure: OR End time: 09/18/2017 10:50 AM Staffing Resident/CRNA: Minerva EndsMirarchi, Ourania Hamler M, CRNA Performed: resident/CRNA  Preanesthetic Checklist Completed: patient identified, site marked, surgical consent, pre-op evaluation, timeout performed, IV checked, risks and benefits discussed and monitors and equipment checked Spinal Block Patient position: sitting Prep: ChloraPrep and site prepped and draped Patient monitoring: heart rate, continuous pulse ox, blood pressure and cardiac monitor Approach: midline Location: L3-4 Injection technique: single-shot Needle Needle type: Sprotte  Needle gauge: 24 G Additional Notes Expiration date of tray noted and within date.   Patient tolerated procedure well. DR Bradley FerrisEllender present --- assisted and supervised spinal pt  with significant curvature of the spine-- + CSF with slow aspiration --  Before injection and none after injection-- pt advised that feet feel " like pins and needles" prep at time of injection.

## 2017-09-18 NOTE — Anesthesia Procedure Notes (Signed)
Procedure Name: LMA Insertion Date/Time: 09/18/2017 11:17 AM Performed by: Elisabeth CaraArmistead, Anicka Stuckert A, CRNA Pre-anesthesia Checklist: Patient identified, Emergency Drugs available, Suction available, Patient being monitored and Timeout performed Patient Re-evaluated:Patient Re-evaluated prior to induction Oxygen Delivery Method: Circle system utilized Preoxygenation: Pre-oxygenation with 100% oxygen Induction Type: IV induction Ventilation: Mask ventilation without difficulty LMA: LMA with gastric port inserted LMA Size: 4.0 Number of attempts: 1 Placement Confirmation: positive ETCO2 and breath sounds checked- equal and bilateral Tube secured with: Tape Dental Injury: Teeth and Oropharynx as per pre-operative assessment

## 2017-09-18 NOTE — Transfer of Care (Signed)
Immediate Anesthesia Transfer of Care Note  Patient: Becky JerichoRobin M Cunningham  Procedure(s) Performed: BILATERAL ANTERIOR TOTAL HIP ARTHROPLASTY (Bilateral Hip)  Patient Location: PACU  Anesthesia Type:General  Level of Consciousness: awake and alert   Airway & Oxygen Therapy: Patient Spontanous Breathing and Patient connected to face mask oxygen  Post-op Assessment: Report given to RN and Post -op Vital signs reviewed and stable  Post vital signs: Reviewed and stable  Last Vitals:  Vitals:   09/18/17 0934  BP: (!) 143/82  Pulse: 68  Resp: 18  Temp: 36.9 C  SpO2: 100%    Last Pain:  Vitals:   09/18/17 0934  TempSrc: Oral         Complications: No apparent anesthesia complications

## 2017-09-18 NOTE — Brief Op Note (Signed)
09/18/2017  2:56 PM  PATIENT:  Ricardo Jerichoobin M Beaird  67 y.o. female  PRE-OPERATIVE DIAGNOSIS:  bilateral hip osteoarthritis  POST-OPERATIVE DIAGNOSIS:  bilateral hip osteoarthritis  PROCEDURE:  Procedure(s): BILATERAL ANTERIOR TOTAL HIP ARTHROPLASTY (Bilateral)  SURGEON:  Surgeon(s) and Role:    Kathryne Hitch* Collyns Mcquigg Y, MD - Primary  PHYSICIAN ASSISTANT: Rexene EdisonGil Clark, PA-C  ANESTHESIA:   spinal and general  EBL:  450 mL   COUNTS:  YES  PLAN OF CARE: Admit to inpatient   PATIENT DISPOSITION:  PACU - hemodynamically stable.   Delay start of Pharmacological VTE agent (>24hrs) due to surgical blood loss or risk of bleeding: no

## 2017-09-18 NOTE — Anesthesia Procedure Notes (Signed)
Date/Time: 09/18/2017 10:39 AM Performed by: Minerva EndsMirarchi, Yoltzin Barg M, CRNA Pre-anesthesia Checklist: Patient identified, Emergency Drugs available, Suction available, Patient being monitored and Timeout performed Patient Re-evaluated:Patient Re-evaluated prior to induction Oxygen Delivery Method: Simple face mask Placement Confirmation: positive ETCO2 and breath sounds checked- equal and bilateral Dental Injury: Teeth and Oropharynx as per pre-operative assessment  Comments: Simple face mask for sedation

## 2017-09-19 LAB — CBC
HCT: 26.2 % — ABNORMAL LOW (ref 36.0–46.0)
Hemoglobin: 9.1 g/dL — ABNORMAL LOW (ref 12.0–15.0)
MCH: 31.4 pg (ref 26.0–34.0)
MCHC: 34.7 g/dL (ref 30.0–36.0)
MCV: 90.3 fL (ref 78.0–100.0)
PLATELETS: 175 10*3/uL (ref 150–400)
RBC: 2.9 MIL/uL — AB (ref 3.87–5.11)
RDW: 14.1 % (ref 11.5–15.5)
WBC: 6.8 10*3/uL (ref 4.0–10.5)

## 2017-09-19 LAB — BASIC METABOLIC PANEL
Anion gap: 7 (ref 5–15)
BUN: 12 mg/dL (ref 6–20)
CALCIUM: 7.7 mg/dL — AB (ref 8.9–10.3)
CO2: 25 mmol/L (ref 22–32)
CREATININE: 0.52 mg/dL (ref 0.44–1.00)
Chloride: 105 mmol/L (ref 101–111)
GFR calc Af Amer: >60 mL/min (ref >60)
Glucose, Bld: 116 mg/dL — ABNORMAL HIGH (ref 65–99)
POTASSIUM: 3.4 mmol/L — AB (ref 3.5–5.1)
SODIUM: 137 mmol/L (ref 135–145)

## 2017-09-19 NOTE — Care Management Note (Signed)
67 yo F s/p bilateral anterior total hip arthroplasty. Received referral to assist pt with [facility] and DME. Met with pt at bedside. She plans to return home with the support of her husband. She has DME ([facility], [facility]ator, [facility]) from previous knee surgery. Referral made to [facility] by the physician's office. Pt agrees to use [facility].

## 2017-09-19 NOTE — Progress Notes (Signed)
OT Cancellation Note  Patient Details Name: Becky JerichoRobin M Dorrough MRN: [id number] DOB: July 20, 1951   Cancelled Treatment:    Reason Eval/Treat Not Completed: Medical issues which prohibited therapy. Pt was diaphoretic when sitting EOB with PT. Will either check back later today or tomorrow.  Marvelyn Bouchillon 09/19/2017, 11:48 AM  Marica OtterMaryellen Milbern Doescher, OTR/L [phone] 09/19/2017

## 2017-09-19 NOTE — Evaluation (Signed)
Physical Therapy Evaluation Patient Details Name: Becky Cunningham MRN: [id number] DOB: 03-20-1951 Today's Date: 09/19/2017   History of Present Illness  s/p bil THA; PMH: bil TKAs  Clinical Impression  Pt is s/p THA resulting in the deficits listed below (see PT Problem List).  Pt will benefit from skilled PT to increase their independence and safety with mobility to allow discharge to the venue listed below.  Pt with low BP, diaphoretic with sitting EOB, RN notified and discussed with MD as well; will see again in pm   Follow Up Recommendations DC plan and follow up therapy as arranged by surgeon    Equipment Recommendations  None recommended by PT    Recommendations for Other Services       Precautions / Restrictions Precautions Precautions: Fall Restrictions Weight Bearing Restrictions: No Other Position/Activity Restrictions: WBAT      Mobility  Bed Mobility Overal bed mobility: Needs Assistance Bed Mobility: Supine to Sit;Sit to Supine     Supine to sit: Mod assist;+2 for safety/equipment Sit to supine: Max assist;Total assist;+2 for physical assistance;+2 for safety/equipment   General bed mobility comments: assist with trunk and LEs, bed pad used for positioning; pt with c/o dizziness on EOB, BP 74/42, pt diaphoretic after sitting for ~3 mins, returned to supine  Transfers                    Ambulation/Gait                Stairs            Wheelchair Mobility    Modified Rankin (Stroke Patients Only)       Balance Overall balance assessment: Needs assistance Sitting-balance support: Feet supported;Bilateral upper extremity supported Sitting balance-Leahy Scale: Fair Sitting balance - Comments: unable to wt shfit d/t dizziness                                     Pertinent Vitals/Pain Pain Assessment: 0-10 Pain Score: 3  Pain Location: bil hips Pain Intervention(s): Limited activity within patient's  tolerance;Monitored during session;Premedicated before session;Repositioned    Home Living Family/patient expects to be discharged to:: Private residence Living Arrangements: Spouse/significant other Available Help at Discharge: Family Type of Home: (condo ) Home Access: Elevator     Home Layout: One level Home Equipment: Environmental consultant - 2 wheels;Walker - 4 wheels;Bedside commode;Cane - single point      Prior Function Level of Independence: Independent;Independent with assistive device(s)         Comments: pt amb with cane d/t pain prior to surgery     Hand Dominance        Extremity/Trunk Assessment   Upper Extremity Assessment Upper Extremity Assessment: Defer to OT evaluation;Overall WFL for tasks assessed    Lower Extremity Assessment Lower Extremity Assessment: RLE deficits/detail;LLE deficits/detail RLE Deficits / Details: ankle WFL; knee and hip strength 2+/5, limited by post op pain and weakness LLE Deficits / Details: ankle WFL; knee and hip strength 2+/5, limited by post op pain and weakness       Communication   Communication: No difficulties  Cognition Arousal/Alertness: Awake/alert Behavior During Therapy: WFL for tasks assessed/performed Overall Cognitive Status: Within Functional Limits for tasks assessed  General Comments      Exercises Total Joint Exercises Ankle Circles/Pumps: AROM;Both;5 reps Quad Sets: Both;5 reps;AROM Gluteal Sets: 5 reps Heel Slides: Both;5 reps;AAROM   Assessment/Plan    PT Assessment Patient needs continued PT services  PT Problem List Decreased strength;Decreased range of motion;Decreased activity tolerance;Decreased balance;Decreased mobility;Decreased safety awareness;Pain       PT Treatment Interventions DME instruction;Gait training;Functional mobility training;Therapeutic activities;Therapeutic exercise;Patient/family education;Stair training    PT Goals  (Current goals can be found in the Care Plan section)  Acute Rehab PT Goals Patient Stated Goal: to walk, go home and not to rehab PT Goal Formulation: With patient Potential to Achieve Goals: Good    Frequency 7X/week   Barriers to discharge        Co-evaluation               AM-PAC PT "6 Clicks" Daily Activity  Outcome Measure Difficulty turning over in bed (including adjusting bedclothes, sheets and blankets)?: Unable Difficulty moving from lying on back to sitting on the side of the bed? : Unable Difficulty sitting down on and standing up from a chair with arms (e.g., wheelchair, bedside commode, etc,.)?: Unable Help needed moving to and from a bed to chair (including a wheelchair)?: Total Help needed walking in hospital room?: Total Help needed climbing 3-5 steps with a railing? : Total 6 Click Score: 6    End of Session   Activity Tolerance: Treatment limited secondary to medical complications (Comment)(decr BP) Patient left: in bed;with bed alarm set;with call bell/phone within reach Nurse Communication: Mobility status PT Visit Diagnosis: Difficulty in walking, not elsewhere classified (R26.2);Muscle weakness (generalized) (M62.81)    Time: [id number] PT Time Calculation (min) (ACUTE ONLY): 18 min   Charges:   PT Evaluation $PT Eval Low Complexity: 1 Low     PT G CodesDrucilla Chalet:        Becky Cunningham, PT Pager: [phone] 09/19/2017   Drucilla ChaletWILLIAMS,Becky Senna 09/19/2017, 1:21 PM

## 2017-09-19 NOTE — Progress Notes (Signed)
Physical Therapy Treatment Patient Details Name: Becky JerichoRobin M Kristiansen MRN: [id number] DOB: [birth year] Today's Date: 09/19/2017    History of Present Illness s/p bil THA; PMH: bil TKAs    PT Comments    Progress limited as pt continues to be dizzy sitting EOB and mildly diaphoretic, BP 81/44, O2 sats 97-99% on RA, HR 70s; returned pt to supine; pt participated in bil LE exercises;  Will continue to follow  Follow Up Recommendations  DC plan and follow up therapy as arranged by surgeon     Equipment Recommendations  None recommended by PT    Recommendations for Other Services       Precautions / Restrictions Precautions Precautions: Fall Restrictions Other Position/Activity Restrictions: WBAT    Mobility  Bed Mobility Overal bed mobility: Needs Assistance Bed Mobility: Supine to Sit;Sit to Supine     Supine to sit: Mod assist Sit to supine: Mod assist   General bed mobility comments: assist with trunk and LEs, bed pad used for positioning; pt with c/o dizziness on EOB, BP 81/44, pt mildly diaphoretic after sitting for ~2-3 mins, able to scoot along EOB laterally toward HOB with min/mod assist;returned to supine  Transfers                 General transfer comment: deferred OOB d/t dizziness and low BP  Ambulation/Gait                 Stairs            Wheelchair Mobility    Modified Rankin (Stroke Patients Only)       Balance Overall balance assessment: Needs assistance Sitting-balance support: Feet supported;Single extremity supported Sitting balance-Leahy Scale: Fair Sitting balance - Comments: unable to wt shfit d/t dizziness                                    Cognition Arousal/Alertness: Awake/alert Behavior During Therapy: WFL for tasks assessed/performed Overall Cognitive Status: Within Functional Limits for tasks assessed                                        Exercises Total Joint Exercises Ankle  Circles/Pumps: AROM;Both;10 reps Quad Sets: AROM;Strengthening;10 reps Gluteal Sets: AROM;Strengthening;10 reps Short Arc Quad: AROM;Both;10 reps Heel Slides: AAROM;Both;10 reps Hip ABduction/ADduction: AROM;Both;10 reps    General Comments        Pertinent Vitals/Pain Pain Assessment: 0-10 Pain Score: 3  Pain Location: bil hips Pain Descriptors / Indicators: Discomfort;Grimacing;Guarding Pain Intervention(s): Limited activity within patient's tolerance;Monitored during session;Ice applied    Home Living                      Prior Function            PT Goals (current goals can now be found in the care plan section) Acute Rehab PT Goals Patient Stated Goal: to walk, go home and not to rehab PT Goal Formulation: With patient Potential to Achieve Goals: Good Progress towards PT goals: Progressing toward goals    Frequency    7X/week      PT Plan Current plan remains appropriate    Co-evaluation              AM-PAC PT "6 Clicks" Daily Activity  Outcome Measure  Difficulty turning over in bed (  including adjusting bedclothes, sheets and blankets)?: Unable Difficulty moving from lying on back to sitting on the side of the bed? : Unable Difficulty sitting down on and standing up from a chair with arms (e.g., wheelchair, bedside commode, etc,.)?: Unable Help needed moving to and from a bed to chair (including a wheelchair)?: Total Help needed walking in hospital room?: Total Help needed climbing 3-5 steps with a railing? : Total 6 Click Score: 6    End of Session   Activity Tolerance: Treatment limited secondary to medical complications (Comment)(dizziness, decr BP) Patient left: in bed;with bed alarm set;with call bell/phone within reach Nurse Communication: Mobility status PT Visit Diagnosis: Difficulty in walking, not elsewhere classified (R26.2);Muscle weakness (generalized) (M62.81)     Time: 1610-9604 PT Time Calculation (min) (ACUTE ONLY):  29 min  Charges:  $Therapeutic Exercise: 8-22 mins $Therapeutic Activity: 8-22 mins                    G CodesDrucilla Chalet, PT Pager: [phone] 09/19/2017    [facility] 09/19/2017, 3:16 PM

## 2017-09-19 NOTE — Progress Notes (Signed)
Subjective: 1 Day Post-Op Procedure(s) (LRB): BILATERAL ANTERIOR TOTAL HIP ARTHROPLASTY (Bilateral) Patient reports pain as mild. At rest    Objective: Vital signs in last 24 hours: Temp:  [97.5 F (36.4 C)-98.5 F (36.9 C)] 98 F (36.7 C) (01/12 1023) Pulse Rate:  [60-85] 85 (01/12 1023) Resp:  [10-23] 16 (01/12 1023) BP: (91-127)/(45-81) 91/45 (01/12 1023) SpO2:  [99 %-100 %] 100 % (01/12 1023)  Intake/Output from previous day: 01/11 0701 - 01/12 0700 In: 4194.5 [P.O.:1080; I.V.:3009.5; IV Piggyback:105] Out: 2300 [Urine:1850; Blood:450] Intake/Output this shift: Total I/O In: -  Out: 900 [Urine:900]  Recent Labs    09/19/17 0539  HGB 9.1*   Recent Labs    09/19/17 0539  WBC 6.8  RBC 2.90*  HCT 26.2*  PLT 175   Recent Labs    09/19/17 0539  NA 137  K 3.4*  CL 105  CO2 25  BUN 12  CREATININE 0.52  GLUCOSE 116*  CALCIUM 7.7*   No results for input(s): LABPT, INR in the last 72 hours.  Neurologically intact Compartment soft  Assessment/Plan: 1 Day Post-Op Procedure(s) (LRB): BILATERAL ANTERIOR TOTAL HIP ARTHROPLASTY (Bilateral) Advance diet Up with therapy Discharge home with home health  No related problems, neuro intact, monitor lab Becky Cunningham W Cheryln Balcom 09/19/2017, 10:26 AM

## 2017-09-19 NOTE — Plan of Care (Signed)
Plan of care reviewed and discussed with patient.   

## 2017-09-20 LAB — CBC
HCT: 23.8 % — ABNORMAL LOW (ref 36.0–46.0)
HEMOGLOBIN: 8.1 g/dL — AB (ref 12.0–15.0)
MCH: 30.6 pg (ref 26.0–34.0)
MCHC: 34 g/dL (ref 30.0–36.0)
MCV: 89.8 fL (ref 78.0–100.0)
PLATELETS: 153 10*3/uL (ref 150–400)
RBC: 2.65 MIL/uL — AB (ref 3.87–5.11)
RDW: 14.1 % (ref 11.5–15.5)
WBC: 6.5 10*3/uL (ref 4.0–10.5)

## 2017-09-20 NOTE — Progress Notes (Signed)
Physical Therapy Treatment Patient Details Name: Becky Cunningham MRN: [id number] DOB: 20-Dec-1950 Today's Date: 09/20/2017    History of Present Illness s/p bil THA; PMH: bil TKAs    PT Comments    Pt progressing well, overall feeling better today; amb 3280' with RW and min assist; will continue current POC  Follow Up Recommendations  DC plan and follow up therapy as arranged by surgeon     Equipment Recommendations  None recommended by PT    Recommendations for Other Services       Precautions / Restrictions Precautions Precautions: Fall Restrictions Weight Bearing Restrictions: No Other Position/Activity Restrictions: WBAT    Mobility  Bed Mobility Overal bed mobility: Needs Assistance Bed Mobility: Supine to Sit     Supine to sit: Min assist     General bed mobility comments: assist with LEs, cues for technique  Transfers Overall transfer level: Needs assistance Equipment used: Rolling walker (2 wheeled) Transfers: Sit to/from Stand Sit to Stand: From elevated surface;Min assist;Mod assist         General transfer comment: assist to rise and stabilize  Ambulation/Gait Ambulation/Gait assistance: Min assist;+2 safety/equipment Ambulation Distance (Feet): 80 Feet Assistive device: Rolling walker (2 wheeled) Gait Pattern/deviations: Step-to pattern;Wide base of support   Gait velocity interpretation: Below normal speed for age/gender General Gait Details: heavy use of UEs, cues for RW safety and position esp with turns   Stairs            Wheelchair Mobility    Modified Rankin (Stroke Patients Only)       Balance           Standing balance support: Bilateral upper extremity supported;During functional activity Standing balance-Leahy Scale: Poor Standing balance comment: heaviliy reliant on UEs                            Cognition Arousal/Alertness: Awake/alert Behavior During Therapy: WFL for tasks  assessed/performed Overall Cognitive Status: Within Functional Limits for tasks assessed                                        Exercises      General Comments        Pertinent Vitals/Pain Pain Assessment: 0-10 Pain Score: 4  Pain Location: bil hips Pain Descriptors / Indicators: Discomfort;Grimacing;Guarding Pain Intervention(s): Limited activity within patient's tolerance;Monitored during session;Ice applied    Home Living                      Prior Function            PT Goals (current goals can now be found in the care plan section) Acute Rehab PT Goals Patient Stated Goal: to walk, go home and not to rehab PT Goal Formulation: With patient Potential to Achieve Goals: Good Progress towards PT goals: Progressing toward goals    Frequency    7X/week      PT Plan Current plan remains appropriate    Co-evaluation              AM-PAC PT "6 Clicks" Daily Activity  Outcome Measure  Difficulty turning over in bed (including adjusting bedclothes, sheets and blankets)?: Unable Difficulty moving from lying on back to sitting on the side of the bed? : Unable Difficulty sitting down on and standing up from a chair with  arms (e.g., wheelchair, bedside commode, etc,.)?: Unable Help needed moving to and from a bed to chair (including a wheelchair)?: A Little Help needed walking in hospital room?: A Little Help needed climbing 3-5 steps with a railing? : A Lot 6 Click Score: 11    End of Session Equipment Utilized During Treatment: Gait belt Activity Tolerance: Patient tolerated treatment well Patient left: in chair;with call bell/phone within reach;with chair alarm set Nurse Communication: Mobility status PT Visit Diagnosis: Difficulty in walking, not elsewhere classified (R26.2);Muscle weakness (generalized) (M62.81)     Time: 1610-9604 PT Time Calculation (min) (ACUTE ONLY): 19 min  Charges:  $Gait Training: 8-22 mins                     G Codes:          Tage Feggins 2017/10/03, 12:49 PM

## 2017-09-20 NOTE — Progress Notes (Signed)
Subjective: 2 Days Post-Op Procedure(s) (LRB): BILATERAL ANTERIOR TOTAL HIP ARTHROPLASTY (Bilateral) Patient reports pain as mild and moderate.    Objective: Vital signs in last 24 hours: Temp:  [98 F (36.7 C)-99.3 F (37.4 C)] 99.3 F (37.4 C) (01/13 0603) Pulse Rate:  [81-88] 88 (01/13 0603) Resp:  [16] 16 (01/13 0603) BP: (91-117)/(45-60) 117/60 (01/13 0603) SpO2:  [96 %-100 %] 96 % (01/13 0603)  Intake/Output from previous day: 01/12 0701 - 01/13 0700 In: 1427.5 [P.O.:480; I.V.:947.5] Out: 2475 [Urine:2475] Intake/Output this shift: Total I/O In: 221.3 [I.V.:221.3] Out: -   Recent Labs    09/19/17 0539 09/20/17 0552  HGB 9.1* 8.1*   Recent Labs    09/19/17 0539 09/20/17 0552  WBC 6.8 6.5  RBC 2.90* 2.65*  HCT 26.2* 23.8*  PLT 175 153   Recent Labs    09/19/17 0539  NA 137  K 3.4*  CL 105  CO2 25  BUN 12  CREATININE 0.52  GLUCOSE 116*  CALCIUM 7.7*   No results for input(s): LABPT, INR in the last 72 hours.  Neurovascular intact Sensation intact distally Dorsiflexion/Plantar flexion intact Incision: dressing C/D/I  Assessment/Plan: 2 Days Post-Op Procedure(s) (LRB): BILATERAL ANTERIOR TOTAL HIP ARTHROPLASTY (Bilateral) Advance diet Discharge home with home health Tuesday  Becky CodeBrian Petrarca PA-C 09/20/2017, 10:00 AM

## 2017-09-20 NOTE — Anesthesia Postprocedure Evaluation (Signed)
Anesthesia Post Note  Patient: Becky JerichoRobin M Aronoff  Procedure(s) Performed: BILATERAL ANTERIOR TOTAL HIP ARTHROPLASTY (Bilateral Hip)     Patient location during evaluation: PACU Anesthesia Type: General Level of consciousness: awake and alert Pain management: pain level controlled Vital Signs Assessment: post-procedure vital signs reviewed and stable Respiratory status: spontaneous breathing, nonlabored ventilation, respiratory function stable and patient connected to nasal cannula oxygen Cardiovascular status: blood pressure returned to baseline and stable Postop Assessment: no apparent nausea or vomiting Anesthetic complications: no    Last Vitals:  Vitals:   09/20/17 0603 09/20/17 1417  BP: 117/60 121/61  Pulse: 88 85  Resp: 16 16  Temp: 37.4 C 37.1 C  SpO2: 96% 98%    Last Pain:  Vitals:   09/20/17 1935  TempSrc:   PainSc: 2                  Meric Joye P Lyall Faciane

## 2017-09-20 NOTE — Plan of Care (Signed)
Plan of care reviewed. 

## 2017-09-20 NOTE — Progress Notes (Signed)
Physical Therapy Treatment Patient Details Name: Becky Cunningham MRN: [id number] DOB: 06/25/1951 Today's Date: 09/20/2017    History of Present Illness s/p bil THA; PMH: bil TKAs    PT Comments    Exercise focused session as pt states she just got back to bed and wanted to nap;  performed and reviewed HEP; pt is progressing well, able to perform more AROM today; plan is to D/C Monday or Tuesday depending on progress  Follow Up Recommendations  DC plan and follow up therapy as arranged by surgeon     Equipment Recommendations  None recommended by PT    Recommendations for Other Services       Precautions / Restrictions Precautions Precautions: Fall Restrictions Weight Bearing Restrictions: No Other Position/Activity Restrictions: WBAT    Mobility  Bed Mobility Overal bed mobility: Needs Assistance Bed Mobility: Sit to Supine     Supine to sit: Min assist Sit to supine: Min assist   General bed mobility comments: (pt just back to bed and preferred not to walk again)  Transfers Overall transfer level: Needs assistance Equipment used: Rolling walker (2 wheeled) Transfers: Sit to/from Stand Sit to Stand: Min assist         General transfer comment: assist to rise and stabilize  Ambulation/Gait Ambulation/Gait assistance: Min assist;+2 safety/equipment Ambulation Distance (Feet): 80 Feet Assistive device: Rolling walker (2 wheeled) Gait Pattern/deviations: Step-to pattern;Wide base of support   Gait velocity interpretation: Below normal speed for age/gender General Gait Details: heavy use of UEs, cues for RW safety and position esp with turns   Stairs            Wheelchair Mobility    Modified Rankin (Stroke Patients Only)       Balance           Standing balance support: Bilateral upper extremity supported;During functional activity Standing balance-Leahy Scale: Poor Standing balance comment: heaviliy reliant on UEs                            Cognition Arousal/Alertness: Awake/alert Behavior During Therapy: WFL for tasks assessed/performed Overall Cognitive Status: Within Functional Limits for tasks assessed                                        Exercises Total Joint Exercises Ankle Circles/Pumps: AROM;Both;10 reps Quad Sets: AROM;Strengthening;10 reps Gluteal Sets: AROM;Strengthening;10 reps Short Arc Quad: AROM;Both;10 reps Heel Slides: AAROM;Both;10 reps Hip ABduction/ADduction: AROM;Both;10 reps;AAROM    General Comments        Pertinent Vitals/Pain Pain Assessment: 0-10 Pain Score: 3  Pain Location: bil hips Pain Descriptors / Indicators: Discomfort;Grimacing;Guarding Pain Intervention(s): Limited activity within patient's tolerance;Monitored during session;Repositioned;Ice applied    Home Living Family/patient expects to be discharged to:: Private residence Living Arrangements: Spouse/significant other Available Help at Discharge: Family Type of Home: House(condo) Home Access: Elevator   Home Layout: One level Home Equipment: Environmental consultantWalker - 2 wheels;Walker - 4 wheels;Bedside commode;Cane - single point;Shower seat;Hand held shower head      Prior Function Level of Independence: Independent;Independent with assistive device(s)      Comments: pt amb with cane d/t pain prior to surgery   PT Goals (current goals can now be found in the care plan section) Acute Rehab PT Goals Patient Stated Goal: to walk, go home and not to rehab PT Goal Formulation: With patient  Potential to Achieve Goals: Good Progress towards PT goals: Progressing toward goals    Frequency    7X/week      PT Plan Current plan remains appropriate    Co-evaluation              AM-PAC PT "6 Clicks" Daily Activity  Outcome Measure  Difficulty turning over in bed (including adjusting bedclothes, sheets and blankets)?: Unable Difficulty moving from lying on back to sitting on the side of the  bed? : Unable Difficulty sitting down on and standing up from a chair with arms (e.g., wheelchair, bedside commode, etc,.)?: Unable Help needed moving to and from a bed to chair (including a wheelchair)?: A Little Help needed walking in hospital room?: A Little Help needed climbing 3-5 steps with a railing? : A Lot 6 Click Score: 11    End of Session Equipment Utilized During Treatment: Gait belt Activity Tolerance: Patient tolerated treatment well Patient left: in bed;with call bell/phone within reach;with bed alarm set Nurse Communication: Mobility status PT Visit Diagnosis: Difficulty in walking, not elsewhere classified (R26.2);Muscle weakness (generalized) (M62.81)     Time: 3244-0102 PT Time Calculation (min) (ACUTE ONLY): 22 min  Charges:   $Therapeutic Exercise: 8-22 mins                    G CodesDrucilla Chalet, PT Pager: [phone] 09/20/2017    Drucilla Chalet 09/20/2017, 3:10 PM

## 2017-09-20 NOTE — Progress Notes (Signed)
Occupational Therapy Evaluation Patient Details Name: Becky Cunningham MRN: [id number] DOB: October 05, 1950 Today's Date: 09/20/2017    History of Present Illness s/p bil THA; PMH: bil TKAs   Clinical Impression   Patient presents to OT with decreased ADL independence as expected s/p B THR. Will benefit from skilled OT to maximize function and to facilitate a safe discharge.    Follow Up Recommendations  No OT follow up;Supervision/Assistance - 24 hour    Equipment Recommendations  None recommended by OT    Recommendations for Other Services       Precautions / Restrictions Precautions Precautions: Fall Restrictions Weight Bearing Restrictions: No Other Position/Activity Restrictions: WBAT      Mobility Bed Mobility Overal bed mobility: Needs Assistance Bed Mobility: Sit to Supine      Sit to supine: Min assist   General bed mobility comments: assist with LEs, cues for technique  Transfers Overall transfer level: Needs assistance Equipment used: Rolling walker (2 wheeled) Transfers: Sit to/from Stand Sit to Stand: Min assist         General transfer comment: assist to rise and stabilize    Balance                                        ADL either performed or assessed with clinical judgement   ADL Overall ADL's : Needs assistance/impaired Eating/Feeding: Independent   Grooming: Set up   Upper Body Bathing: Set up   Lower Body Bathing: Minimal assistance;Moderate assistance   Upper Body Dressing : Set up   Lower Body Dressing: Minimal assistance;Moderate assistance   Toilet Transfer: Minimal assistance;Cueing for sequencing;Stand-pivot;BSC;RW   Toileting- Clothing Manipulation and Hygiene: Set up;Sitting/lateral lean     Tub/Shower Transfer Details (indicate cue type and reason): pt states she remembers technique from her B TKR surgery Functional mobility during ADLs: Minimal assistance;Rolling walker General ADL Comments: Patient  received up in recliner. Agreeable to practicing [facility] transfer. Patient performed toileting task with min A overall using BSC near recliner. After toileting, preferred to return to bed. Patient positioned comfortably. [name]     Vision Baseline Vision/History: Wears glasses Patient Visual Report: No change from baseline       Perception     Praxis      Pertinent Vitals/Pain Pain Assessment: 0-10 Pain Score: 3  Pain Location: bil hips Pain Descriptors / Indicators: Discomfort;Grimacing;Guarding Pain Intervention(s): Limited activity within patient's tolerance;Monitored during session;Ice applied;Repositioned     Hand Dominance     Extremity/Trunk Assessment Upper Extremity Assessment Upper Extremity Assessment: Overall WFL for tasks assessed   Lower Extremity Assessment Lower Extremity Assessment: Defer to PT evaluation   Cervical / Trunk Assessment Cervical / Trunk Assessment: Normal   Communication Communication Communication: No difficulties   Cognition Arousal/Alertness: Awake/alert Behavior During Therapy: WFL for tasks assessed/performed Overall Cognitive Status: Within Functional Limits for tasks assessed                                     General Comments       Exercises     Shoulder Instructions      Home Living Family/patient expects to be discharged to:: Private residence Living Arrangements: Spouse/significant other Available Help at Discharge:  Family Type of Home: House(condo) Home Access: Elevator     Home Layout: One level     Bathroom Shower/Tub: Producer, television/film/videoWalk-in shower   Bathroom Toilet: Standard Bathroom Accessibility: Yes How Accessible: Accessible via walker Home Equipment: Walker - 2 wheels;Walker - 4 wheels;Bedside commode;Cane - single point;Shower seat;Hand held shower head          Prior Functioning/Environment Level of  Independence: Independent;Independent with assistive device(s)        Comments: pt amb with cane d/t pain prior to surgery        OT Problem List: Decreased strength;Decreased range of motion;Decreased activity tolerance;Decreased knowledge of use of DME or AE;Pain      OT Treatment/Interventions: Self-care/ADL training;DME and/or AE instruction;Therapeutic activities;Patient/family education    OT Goals(Current goals can be found in the care plan section) Acute Rehab OT Goals Patient Stated Goal: to walk, go home and not to rehab OT Goal Formulation: With patient Time For Goal Achievement: 10/04/17 Potential to Achieve Goals: Good ADL Goals Pt Will Perform Lower Body Bathing: with min assist Pt Will Perform Lower Body Dressing: with min assist Pt Will Perform Tub/Shower Transfer: with min guard assist  OT Frequency: Min 2X/week   Barriers to D/C:            Co-evaluation              AM-PAC PT "6 Clicks" Daily Activity     Outcome Measure Help from another person eating meals?: None Help from another person taking care of personal grooming?: None Help from another person toileting, which includes using toliet, bedpan, or urinal?: A Little Help from another person bathing (including washing, rinsing, drying)?: A Little Help from another person to put on and taking off regular upper body clothing?: None Help from another person to put on and taking off regular lower body clothing?: A Lot 6 Click Score: 20   End of Session Equipment Utilized During Treatment: Rolling walker Nurse Communication: Other (comment)(urine in bathroom left for nursing to measure)  Activity Tolerance: Patient tolerated treatment well Patient left: in bed;with call bell/phone within reach  OT Visit Diagnosis: Unsteadiness on feet (R26.81);Other abnormalities of gait and mobility (R26.89)                Time: 0981-19141214-1235 OT Time Calculation (min): 21 min Charges:  OT General Charges $OT  Visit: 1 Visit OT Evaluation $OT Eval Moderate Complexity: 1 Mod G-Codes:       Eddie Koc A Jacky Hartung 09/20/2017, 1:21 PM

## 2017-09-21 ENCOUNTER — Encounter (HOSPITAL_COMMUNITY): Payer: Self-pay | Admitting: Orthopaedic Surgery

## 2017-09-21 LAB — CBC
HEMATOCRIT: 21.9 % — AB (ref 36.0–46.0)
Hemoglobin: 7.5 g/dL — ABNORMAL LOW (ref 12.0–15.0)
MCH: 30.7 pg (ref 26.0–34.0)
MCHC: 34.2 g/dL (ref 30.0–36.0)
MCV: 89.8 fL (ref 78.0–100.0)
Platelets: 152 10*3/uL (ref 150–400)
RBC: 2.44 MIL/uL — ABNORMAL LOW (ref 3.87–5.11)
RDW: 14 % (ref 11.5–15.5)
WBC: 5.9 10*3/uL (ref 4.0–10.5)

## 2017-09-21 LAB — PREPARE RBC (CROSSMATCH)

## 2017-09-21 MED ORDER — SODIUM CHLORIDE 0.9 % IV SOLN: Freq: Once | INTRAVENOUS | Status: DC

## 2017-09-21 NOTE — Op Note (Signed)
NAME:  Becky Cunningham, Becky Cunningham                      ACCOUNT NO.:  MEDICAL RECORD NO.:  [id number]  LOCATION:                                 FACILITY:  PHYSICIAN:  Vanita PandaChristopher Y. Magnus IvanBlackman, M.D.DATE OF BIRTH:  DATE OF PROCEDURE:  09/18/2017 DATE OF DISCHARGE:                              OPERATIVE REPORT   PREOPERATIVE DIAGNOSIS:  Severe osteoarthritis and degenerative joint disease, bilateral hips.  POSTOPERATIVE DIAGNOSIS:  Severe osteoarthritis and degenerative joint disease, bilateral hips.  PROCEDURE:  Bilateral total hip arthroplasty through direct anterior approach.  IMPLANTS:  DePuy Sector Gription acetabular component size 52, size 36 +0 neutral polyethylene liner, size 10 Corail femoral component with standard offset, size 36 -2 metal hip ball with the same components and sizes on both sides.  SURGEON:  Vanita PandaChristopher Y. Magnus IvanBlackman, M.D.  ASSISTANT:  Richardean CanalGilbert Clark, PA-C.  ANESTHESIA: 1. Attempted spinal. 2. General.  ANTIBIOTICS:  IV Ancef 2 g.  BLOOD LOSS:  150 mL for the right hip and 300 mL for the left hip.  COMPLICATIONS:  None.  INDICATIONS:  [name] is a very pleasant 67 year old female with severe debilitating arthritis of both her hips.  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  She is a very healthy individual and very active in petite.  At this point, given her daily pain and the worsening of her mobility and the detrimental effect this has had on her activities of daily living, her quality of life, and her mobility, she does wish to proceed with bilateral hip replacements as well.  She understands fully the risk of acute blood loss anemia, nerve and vessel injury, fracture, infection, dislocation, and DVT.  She understands our goals are to decrease pain, improve mobility, and overall improved quality of life.  PROCEDURE DESCRIPTION:  After  informed consent was obtained, appropriate right and left hips were marked.  She was brought to the operating room, where spinal anesthesia was obtained while she was on her stretcher.  A Foley catheter was placed and both feet had traction boots applied to them.  She was placed supine on the Hana fracture table, the perineal post in place and both legs in In-Line skeletal traction devices, but no traction applied.  We decided to start with the right hip, which was worse radiographically and pain wise for her.  We prepped with the right hip with DuraPrep and sterile drapes.  Time-out was called and she was identified as correct patient and correct right hip.  We then made an incision just inferior and posterior to the anterior superior iliac spine and carried this obliquely down the leg.  We dissected down the tensor fascia lata muscle.  The tensor fascia was then divided longitudinally to proceed with a direct anterior approach to the hip. We identified and cauterized the circumflex vessels and identified the hip capsule.  I opened up the hip capsule in an L-type format, finding severe arthritis in the hip and a significant effusion.  We placed Cobra retractor around the medial and lateral femoral neck and  made our femoral neck cut with an oscillating saw and completed this with an osteotome.  We placed a corkscrew guide in the femoral head and removed the femoral head in its entirety and found it to be devoid of cartilage. We then placed a bent Hohmann over the medial acetabular rim and a Hohmann retractor laterally.  I cleaned remnants of acetabular labrum and other debris.  I then began reaming under direct visualization from a size 42 reamer going up to a size 52 with all reamers under direct visualization the last reamer under direct fluoroscopy, so we could see our depth of reaming, our inclination, and anteversion.  Once we were pleased with this, we placed the real DePuy Sector  Gription acetabular component size 52 and a 36 +0 neutral polyethylene liner for that size acetabular component.  Attention was then turned to the femur.  With the leg externally rotated to 120 degrees extended and adducted, we were able to place a Mueller retractor medially and a Hohmann retractor behind the greater trochanter.  We released the lateral joint capsule and used a box cutting osteotome to enter the femoral canal and a rongeur to lateralize and then began broaching from a size 8 broach using the Corail broaching system going up to just a 10.  With a 10 in place, we trialed a standard offset femoral neck and with a 36 -2 hip ball to start with and reduced this in the acetabulum.  We were pleased with leg length, offset, and range of motion and stability, so we dislocated the hip and removed the trial components.  We were able to easily place the real Corail femoral component size 10 with standard offset and the real 36 -2 metal hip ball and reduced the acetabulum and once again under direct fluoroscopy and visualization, we were pleased with leg length, offset, range of motion, and stability.  We then irrigated the soft tissue with normal saline solution and closed the joint capsule with interrupted #1 Ethibond suture, followed by #1 Vicryl running in the tensor fascia, 0 Vicryl in the deep tissue, 2-0 Vicryl in the subcutaneous tissue, 4-0 Monocryl subcuticular stitch, and Steri- Strips on the skin.  We got that side done quick enough and with only 150 mL of blood loss, anesthesia said we were doing well and to proceed with the other side.  We then re-gowned and gloved, then prepped and draped her other left side with DuraPrep and sterile drapes.  We identified that as left hip and the correct hip as well.  We then took a step wire approach with the same dissection all the way down to that hip with going through the same intervals as we did in the aforementioned dictation for  right hip.  We found the same amount of arthritis on the left side and reamed the acetabulum after we made our femoral neck cut and removed the femoral head.  We were able to place a real 52 acetabular component under direct fluoroscopy and visualization on the left and then eventually did the same step as process for the femoral component with going on the left side and a 36 -2 metal hip ball.  We placed the real Corail femoral component size 10 with standard offset on that side as well.  Once both hips were in place, we were again pleased with leg length, offset, range of motion, and stability and we stepwise in the same manner as the right hip closed the left hip with Monocryl  of subcuticular closure.  Once she was finally finished with surgery, we took her off the Corwith table.  She was awakened, extubated, and taken to recovery room in stable condition.  All final counts were correct. There were no complications noted.     Vanita Panda. Magnus Ivan, M.D.     CYB/MEDQ  D:  09/18/2017  T:  09/19/2017  Job:  [id number]

## 2017-09-21 NOTE — Progress Notes (Signed)
Subjective: 3 Days Post-Op Procedure(s) (LRB): BILATERAL ANTERIOR TOTAL HIP ARTHROPLASTY (Bilateral) Patient reports pain as moderate. Making progress with therapy.  Hgb low from acute blood loss anemia.  Objective: Vital signs in last 24 hours: Temp:  [98.7 F (37.1 C)] 98.7 F (37.1 C) (01/13 1417) Pulse Rate:  [85] 85 (01/13 1417) Resp:  [16] 16 (01/13 1417) BP: (121)/(61) 121/61 (01/13 1417) SpO2:  [98 %] 98 % (01/13 1417)  Intake/Output from previous day: 01/13 0701 - 01/14 0700 In: 941.3 [P.O.:720; I.V.:221.3] Out: 2351 [Urine:2350; Stool:1] Intake/Output this shift: No intake/output data recorded.  Recent Labs    09/19/17 0539 09/20/17 0552 09/21/17 0537  HGB 9.1* 8.1* 7.5*   Recent Labs    09/20/17 0552 09/21/17 0537  WBC 6.5 5.9  RBC 2.65* 2.44*  HCT 23.8* 21.9*  PLT 153 152   Recent Labs    09/19/17 0539  NA 137  K 3.4*  CL 105  CO2 25  BUN 12  CREATININE 0.52  GLUCOSE 116*  CALCIUM 7.7*   No results for input(s): LABPT, INR in the last 72 hours.  Sensation intact distally Intact pulses distally Dorsiflexion/Plantar flexion intact Incision: scant drainage  Assessment/Plan: 3 Days Post-Op Procedure(s) (LRB): BILATERAL ANTERIOR TOTAL HIP ARTHROPLASTY (Bilateral) Up with therapy Plan for discharge tomorrow Discharge home with home health  Transfusion today to treat acute blood loss anemia.  Kathryne HitchChristopher Y Kashina Mecum 09/21/2017, 7:13 AM

## 2017-09-21 NOTE — Care Management Important Message (Signed)
Important Message  Patient Details  Name: Becky Cunningham MRN: [id number] Date of Birth: 1950/09/23   Medicare Important Message Given:  Yes    Caren MacadamFuller, Jamason Peckham 09/21/2017, 11:00 AMImportant Message  Patient Details  Name: Becky Cunningham MRN: [id number] Date of Birth: 1950/09/23   Medicare Important Message Given:  Yes    Caren MacadamFuller, Mirza Fessel 09/21/2017, 11:00 AM

## 2017-09-21 NOTE — Progress Notes (Signed)
Physical Therapy Treatment Patient Details Name: Becky JerichoRobin M Cunningham MRN: [id number] DOB: 1951/02/25 Today's Date: 09/21/2017    History of Present Illness s/p bil THA; PMH: bil TKAs    PT Comments    POD #3 am session Pt just completed first unit of blood.  Assisted OOB to amb a greater distance in hallway.  Assisted to bathroom on/off raised toilet.  Performed some THR TE's in recliner followed by ICE.  Tolerated well, no dizziness.   Follow Up Recommendations  DC plan and follow up therapy as arranged by surgeon     Equipment Recommendations  None recommended by PT    Recommendations for Other Services       Precautions / Restrictions Precautions Precautions: Fall Restrictions Weight Bearing Restrictions: No Other Position/Activity Restrictions: WBAT    Mobility  Bed Mobility Overal bed mobility: Needs Assistance Bed Mobility: Supine to Sit     Supine to sit: Min assist     General bed mobility comments: assist B LE off bed with increased time  Transfers Overall transfer level: Needs assistance Equipment used: Rolling walker (2 wheeled) Transfers: Sit to/from Stand Sit to Stand: Min assist         General transfer comment: assist to rise and stabilize with one VC on proper tech  Ambulation/Gait Ambulation/Gait assistance: Min assist;+2 safety/equipment Ambulation Distance (Feet): 125 Feet Assistive device: Rolling walker (2 wheeled) Gait Pattern/deviations: Step-to pattern;Wide base of support Gait velocity: decreased   General Gait Details: heavy use of UEs, cues for RW safety and position esp with turns   Stairs            Wheelchair Mobility    Modified Rankin (Stroke Patients Only)       Balance                                            Cognition Arousal/Alertness: Awake/alert Behavior During Therapy: WFL for tasks assessed/performed Overall Cognitive Status: Within Functional Limits for tasks assessed                                         Exercises   B LE HS, ABD and knee presses   General Comments        Pertinent Vitals/Pain Pain Assessment: 0-10 Pain Score: 4  Pain Location: bil hips  R > L Pain Descriptors / Indicators: Discomfort;Grimacing;Guarding Pain Intervention(s): Premedicated before session;Monitored during session;Repositioned;Ice applied    Home Living                      Prior Function            PT Goals (current goals can now be found in the care plan section) Progress towards PT goals: Progressing toward goals    Frequency    7X/week      PT Plan Current plan remains appropriate    Co-evaluation              AM-PAC PT "6 Clicks" Daily Activity  Outcome Measure  Difficulty turning over in bed (including adjusting bedclothes, sheets and blankets)?: Unable Difficulty moving from lying on back to sitting on the side of the bed? : Unable Difficulty sitting down on and standing up from a chair with arms (e.g., wheelchair, bedside commode,  etc,.)?: Unable Help needed moving to and from a bed to chair (including a wheelchair)?: A Little Help needed walking in hospital room?: A Little Help needed climbing 3-5 steps with a railing? : A Lot 6 Click Score: 11    End of Session Equipment Utilized During Treatment: Gait belt Activity Tolerance: Patient tolerated treatment well Patient left: in chair;with call bell/phone within reach Nurse Communication: Mobility status PT Visit Diagnosis: Difficulty in walking, not elsewhere classified (R26.2);Muscle weakness (generalized) (M62.81)     Time: 1610-9604 PT Time Calculation (min) (ACUTE ONLY): 29 min  Charges:  $Gait Training: 8-22 mins $Therapeutic Exercise: 8-22 mins                    G Codes:       Felecia Shelling  PTA [facility]  Acute  Rehab Pager      [phone]

## 2017-09-22 ENCOUNTER — Encounter (HOSPITAL_COMMUNITY): Payer: Self-pay | Admitting: Orthopaedic Surgery

## 2017-09-22 LAB — TYPE AND SCREEN
ABO/RH(D): O POS
ANTIBODY SCREEN: NEGATIVE
UNIT DIVISION: 0
Unit division: 0

## 2017-09-22 LAB — BPAM RBC
BLOOD PRODUCT EXPIRATION DATE: 201902092359
Blood Product Expiration Date: 201902022359
ISSUE DATE / TIME: 201901140815
ISSUE DATE / TIME: 201901141257
UNIT TYPE AND RH: 5100
UNIT TYPE AND RH: 5100

## 2017-09-22 LAB — CBC
HEMATOCRIT: 26.8 % — AB (ref 36.0–46.0)
Hemoglobin: 9.4 g/dL — ABNORMAL LOW (ref 12.0–15.0)
MCH: 30.7 pg (ref 26.0–34.0)
MCHC: 35.1 g/dL (ref 30.0–36.0)
MCV: 87.6 fL (ref 78.0–100.0)
Platelets: 178 10*3/uL (ref 150–400)
RBC: 3.06 MIL/uL — ABNORMAL LOW (ref 3.87–5.11)
RDW: 14.8 % (ref 11.5–15.5)
WBC: 5.2 10*3/uL (ref 4.0–10.5)

## 2017-09-22 MED ORDER — ASPIRIN 81 MG PO CHEW: 81.0000 mg | CHEWABLE_TABLET | Freq: Two times a day (BID) | ORAL | 0 refills | Status: DC

## 2017-09-22 MED ORDER — HYDROCODONE-ACETAMINOPHEN 5-325 MG PO TABS: 1.0000 | ORAL_TABLET | ORAL | 0 refills | Status: DC | PRN

## 2017-09-22 MED ORDER — METHOCARBAMOL 500 MG PO TABS: 500.0000 mg | ORAL_TABLET | Freq: Four times a day (QID) | ORAL | 0 refills | Status: DC | PRN

## 2017-09-22 NOTE — Progress Notes (Signed)
Physical Therapy Treatment Patient Details Name: Becky JerichoRobin M Cunningham MRN: [id number] DOB: Apr 12, 1951 Today's Date: 09/22/2017    History of Present Illness s/p bil THA; PMH: bil TKAs    PT Comments    POD # 4 Assisted with amb a great distance in hallway and performed/instructed on all THR TE's following HEP handout.  Instructed on proper tech, freq as well as use of ICE. Pt ready for D/C to home.   Follow Up Recommendations  DC plan and follow up therapy as arranged by surgeon     Equipment Recommendations  None recommended by PT    Recommendations for Other Services       Precautions / Restrictions Precautions Precautions: Fall Restrictions Weight Bearing Restrictions: No Other Position/Activity Restrictions: WBAT    Mobility  Bed Mobility Overal bed mobility: Needs Assistance Bed Mobility: Sit to Supine       Sit to supine: Supervision   General bed mobility comments: increased time  Transfers Overall transfer level: Needs assistance Equipment used: Rolling walker (2 wheeled) Transfers: Sit to/from Stand Sit to Stand: Supervision         General transfer comment: assist to rise and stabilize with one VC on proper tech  Ambulation/Gait Ambulation/Gait assistance: Supervision Ambulation Distance (Feet): 136 Feet Assistive device: Rolling walker (2 wheeled) Gait Pattern/deviations: Step-to pattern;Wide base of support Gait velocity: decreased   General Gait Details: heavy use of UEs, cues for RW safety and position esp with turns   Stairs Stairs: Yes(no stairs)          Wheelchair Mobility    Modified Rankin (Stroke Patients Only)       Balance                                            Cognition Arousal/Alertness: Awake/alert Behavior During Therapy: WFL for tasks assessed/performed Overall Cognitive Status: Within Functional Limits for tasks assessed                                        Exercises    Total Hip Replacement TE's 10 reps ankle pumps 10 reps knee presses 10 reps heel slides 10 reps SAQ's 10 reps ABD Followed by ICE     General Comments        Pertinent Vitals/Pain Pain Score: 4  Pain Location: bil hips  R > L Pain Descriptors / Indicators: Discomfort;Grimacing;Guarding Pain Intervention(s): Monitored during session;Repositioned;Ice applied;Premedicated before session    Home Living                      Prior Function            PT Goals (current goals can now be found in the care plan section) Progress towards PT goals: Progressing toward goals    Frequency    7X/week      PT Plan Current plan remains appropriate    Co-evaluation              AM-PAC PT "6 Clicks" Daily Activity  Outcome Measure  Difficulty turning over in bed (including adjusting bedclothes, sheets and blankets)?: Unable   Difficulty sitting down on and standing up from a chair with arms (e.g., wheelchair, bedside commode, etc,.)?: Unable Help needed moving to and from a bed to  chair (including a wheelchair)?: A Little Help needed walking in hospital room?: A Little Help needed climbing 3-5 steps with a railing? : A Lot 6 Click Score: 10    End of Session Equipment Utilized During Treatment: Gait belt Activity Tolerance: Patient tolerated treatment well Patient left: in bed;with call bell/phone within reach Nurse Communication: (pt ready for D/C to home) PT Visit Diagnosis: Difficulty in walking, not elsewhere classified (R26.2);Muscle weakness (generalized) (M62.81)     Time: 1030-1055 PT Time Calculation (min) (ACUTE ONLY): 25 min  Charges:  $Gait Training: 8-22 mins $Therapeutic Exercise: 8-22 mins                    G Codes:       Felecia Shelling  PTA [facility]  Acute  Rehab Pager      [phone]

## 2017-09-22 NOTE — Discharge Summary (Signed)
Patient ID: Becky Cunningham MRN: [id number] DOB/AGE: May 24, 1951 67 y.o.  Admit date: 09/18/2017 Discharge date: 09/22/2017  Admission Diagnoses:  Principal Problem:   Bilateral primary osteoarthritis of hip Active Problems:   History of bilateral hip arthroplasty   Status post bilateral hip replacements   Discharge Diagnoses:  Same  Past Medical History:  Diagnosis Date  . Arthritis   . Depression     Surgeries: Procedure(s): BILATERAL ANTERIOR TOTAL HIP ARTHROPLASTY on 09/18/2017   Consultants:   Discharged Condition: Improved  Hospital Course: Becky Cunningham is an 67 y.o. female who was admitted 09/18/2017 for operative treatment ofBilateral primary osteoarthritis of hip. Patient has severe unremitting pain that affects sleep, daily activities, and work/hobbies. After pre-op clearance the patient was taken to the operating room on 09/18/2017 and underwent  Procedure(s): BILATERAL ANTERIOR TOTAL HIP ARTHROPLASTY.    Patient was given perioperative antibiotics:  Anti-infectives (From admission, onward)   Start     Dose/Rate Route Frequency Ordered Stop   09/18/17 1700  ceFAZolin (ANCEF) IVPB 1 g/50 mL premix     1 g 100 mL/hr over 30 Minutes Intravenous Every 6 hours 09/18/17 1543 09/18/17 2335   09/18/17 0923  ceFAZolin (ANCEF) IVPB 2g/100 mL premix     2 g 200 mL/hr over 30 Minutes Intravenous On call to O.R. 09/18/17 4098 09/18/17 1122       Patient was given sequential compression devices, early ambulation, and chemoprophylaxis to prevent DVT.  Patient benefited maximally from hospital stay and there were no complications.    Recent vital signs:  Patient Vitals for the past 24 hrs:  BP Temp Temp src Pulse Resp SpO2  09/22/17 0528 116/65 99 F (37.2 C) Oral 75 16 98 %  09/21/17 2236 121/70 99.6 F (37.6 C) Oral 82 17 97 %  09/21/17 2058 - - - - 16 -  09/21/17 1607 126/73 97.9 F (36.6 C) Oral 67 18 100 %  09/21/17 1331 108/60 98.5 F (36.9 C) Oral 78 18 -   09/21/17 1245 117/62 98.3 F (36.8 C) Axillary 85 18 100 %  09/21/17 1059 (!) 103/56 97.6 F (36.4 C) Axillary 75 18 100 %  09/21/17 0844 (!) 102/58 98.6 F (37 C) Oral 76 16 98 %  09/21/17 0815 105/60 97.7 F (36.5 C) Oral 82 16 99 %     Recent laboratory studies:  Recent Labs    09/21/17 0537 09/22/17 0600  WBC 5.9 5.2  HGB 7.5* 9.4*  HCT 21.9* 26.8*  PLT 152 178     Discharge Medications:   Allergies as of 09/22/2017      Reactions   Other Other (See Comments)   Sugar enzymes--Throat swelling/scratchy. (when eats something really sweet) with melons      Medication List    STOP taking these medications   diclofenac 50 MG EC tablet Commonly known as:  VOLTAREN     TAKE these medications   amphetamine-dextroamphetamine 20 MG tablet Commonly known as:  ADDERALL Take 40 mg by mouth See admin instructions. Take 2 tablet (40 mg)  in the morning and 1 tablet(20 mg) at lunch as needed for focus during work week.   aspirin 81 MG chewable tablet Chew 1 tablet (81 mg total) by mouth 2 (two) times daily.   b complex vitamins tablet Take 1 tablet by mouth every other day.   CALCIUM 600 + D PO Take 1 tablet by mouth 2 (two) times daily.   DIGESTIVE ENZYMES PO Take 1 capsule  by mouth daily. Probiotic Digestive Enzyme.   DSS 100 MG Caps Take 100 mg by mouth 2 (two) times daily.   ferrous sulfate 325 (65 FE) MG tablet Take 1 tablet (325 mg total) by mouth 3 (three) times daily with meals. What changed:  when to take this   FLUoxetine 20 MG capsule Commonly known as:  PROZAC Take 60 mg by mouth daily.   HYDROcodone-acetaminophen 5-325 MG tablet Commonly known as:  NORCO/VICODIN Take 1-2 tablets by mouth every 4 (four) hours as needed for moderate pain ((score 4 to 6)).   Magnesium 500 MG Tabs Take 500 mg by mouth daily.   methocarbamol 500 MG tablet Commonly known as:  ROBAXIN Take 1 tablet (500 mg total) by mouth every 6 (six) hours as needed for muscle  spasms.   PROBIOTIC PO Take 1 scoop by mouth daily. Probiotic Shake--"Natural Vitality"   PROBIOTIC PO Take 1 scoop by mouth daily. Green Fusion. --shake   TART CHERRY ADVANCED PO Take 1,200 mg by mouth daily with lunch.   vitamin C 500 MG tablet Commonly known as:  ASCORBIC ACID Take 500 mg by mouth 2 (two) times daily.            Durable Medical Equipment  (From admission, onward)        Start     Ordered   09/18/17 1544  DME 3 n 1  Once     09/18/17 1543   09/18/17 1544  DME Walker rolling  Once    Question:  Patient needs a walker to treat with the following condition  Answer:  Status post bilateral hip replacements   09/18/17 1543      Diagnostic Studies: Dg Pelvis Portable  Result Date: 09/18/2017 CLINICAL DATA:  Followup bilateral hip replacement. EXAM: PORTABLE PELVIS 1-2 VIEWS COMPARISON:  Earlier same day FINDINGS: Single view shows bilateral total hip replacement. Components appear well positioned. No radiographically detectable complication. IMPRESSION: Good appearance following bilateral total hip replacement. Electronically Signed   By: Paulina Fusi M.D.   On: 09/18/2017 15:31   Dg C-arm 1-60 Min-no Report  Result Date: 09/18/2017 Fluoroscopy was utilized by the requesting physician.  No radiographic interpretation.   Dg C-arm 1-60 Min-no Report  Result Date: 09/18/2017 Fluoroscopy was utilized by the requesting physician.  No radiographic interpretation.   Dg Hip Operative Unilat W Or W/o Pelvis Left  Result Date: 09/18/2017 CLINICAL DATA:  Left hip replacement. EXAM: OPERATIVE LEFT HIP (WITH PELVIS IF PERFORMED) 3 VIEWS TECHNIQUE: Fluoroscopic spot image(s) were submitted for interpretation post-operatively. COMPARISON:  None. FINDINGS: Total left hip replacement with anatomic alignment. No acute abnormality identified. IMPRESSION: Total left hip replacement with anatomic alignment. Electronically Signed   By: Maisie Fus  Register   On: 09/18/2017 13:41    Dg Hip Operative Unilat W Or W/o Pelvis Right  Result Date: 09/18/2017 CLINICAL DATA:  Right hip replacement. EXAM: OPERATIVE RIGHT HIP (WITH PELVIS IF PERFORMED) 4 VIEWS TECHNIQUE: Fluoroscopic spot image(s) were submitted for interpretation post-operatively. COMPARISON:  No recent prior. FINDINGS: Total right hip replacement. Hardware intact. Anatomic alignment. No acute abnormality. FOUR VIEWS. 0 MINUTES 50 SECONDS FLUOROSCOPY TIME. IMPRESSION: Total right hip replacement.  Anatomic alignment. Electronically Signed   By: Maisie Fus  Register   On: 09/18/2017 13:38    Disposition: to home  Discharge Instructions    Discharge patient   Complete by:  As directed    Discharge disposition:  01-Home or Self Care   Discharge patient date:  09/22/2017  Follow-up Information    Kathryne HitchBlackman, Eaven Schwager Y, MD Follow up in 2 week(s).   Specialty:  Orthopedic Surgery Contact information: [address] [place] [address] [phone]            Signed: Kathryne HitchChristopher Y Ellarie Picking 09/22/2017, 7:13 AM

## 2017-09-22 NOTE — Progress Notes (Signed)
Patient ID: Ricardo JerichoRobin M Schiele, female   DOB: 04/07/1951, 67 y.o.   MRN: [id number] Making great progress.  H/H up with transfusion for acute blood loss anemia.  Vitals stable.  Both hips stable.  Can be discharged to home today.

## 2017-09-22 NOTE — Progress Notes (Signed)
Occupational Therapy Treatment Patient Details Name: Becky Cunningham MRN: [id number] DOB: Nov 11, 1950 Today's Date: 09/22/2017    History of present illness s/p bil THA; PMH: bil TKAs   OT comments  Pt doing well! Ready to go home from OT standpoint  Follow Up Recommendations  No OT follow up;Supervision/Assistance - 24 hour    Equipment Recommendations  None recommended by OT    Recommendations for Other Services      Precautions / Restrictions Precautions Precautions: Fall Restrictions Weight Bearing Restrictions: No       Mobility Bed Mobility               General bed mobility comments: pt sitting on bench at window  Transfers Overall transfer level: Needs assistance Equipment used: Rolling walker (2 wheeled) Transfers: Sit to/from Stand Sit to Stand: Supervision                  ADL either performed or assessed with clinical judgement   ADL Overall ADL's : Needs assistance/impaired Eating/Feeding: Independent   Grooming: Set up   Upper Body Bathing: Set up   Lower Body Bathing: Minimal assistance   Upper Body Dressing : Set up   Lower Body Dressing: Minimal assistance   Toilet Transfer: Stand-pivot;RW;Supervision/safety   Toileting- Clothing Manipulation and Hygiene: Supervision/safety     Tub/Shower Transfer Details (indicate cue type and reason): verbalized safety Functional mobility during ADLs: Minimal assistance;Rolling walker General ADL Comments: Patient received up in recliner. Agreeable to practicing [facility] transfer. Patient performed toileting task with min A overall using BSC near recliner. After toileting, preferred to return to bed. Patient positioned comfortably. [name]     Vision Patient Visual Report: No change from baseline     Perception     Praxis      Cognition Arousal/Alertness: Awake/alert Behavior During Therapy:  WFL for tasks assessed/performed Overall Cognitive Status: Within Functional Limits for tasks assessed                                           Prior Functioning/Environment              Frequency  Min 2X/week        Progress Toward Goals  OT Goals(current goals can now be found in the care plan section)  Progress towards OT goals: Progressing toward goals     Plan Discharge plan remains appropriate    Co-evaluation                 AM-PAC PT "6 Clicks" Daily Activity     Outcome Measure   Help from another person eating meals?: None Help from another person taking care of personal grooming?: None Help from another person toileting, which includes using toliet, bedpan, or urinal?: None Help from another person bathing (including washing, rinsing, drying)?: A Little Help from another person to put on and taking off regular upper body clothing?: None Help from another person to put on and taking off regular lower body clothing?: A Little 6 Click Score: 22    End of Session Equipment Utilized During Treatment: Rolling walker  OT Visit Diagnosis: Unsteadiness on feet (R26.81);Other abnormalities of gait and mobility (R26.89)   Activity Tolerance Patient tolerated treatment well   Patient Left in chair;with call bell/phone  within reach           Time: 0920-0939 OT Time Calculation (min): 19 min  Charges: OT General Charges $OT Visit: 1 Visit OT Treatments $Self Care/Home Management : 8-22 mins  [place]ori Khaden [place], [place] [phone]   Alba CoryREDDING, Giovany Cosby D 09/22/2017, 9:47 AM

## 2017-09-22 NOTE — Discharge Instructions (Signed)

## 2017-09-24 DIAGNOSIS — Z96653 Presence of artificial knee joint, bilateral: Secondary | ICD-10-CM | POA: Diagnosis not present

## 2017-09-24 DIAGNOSIS — Z96643 Presence of artificial hip joint, bilateral: Secondary | ICD-10-CM | POA: Diagnosis not present

## 2017-09-24 DIAGNOSIS — F329 Major depressive disorder, single episode, unspecified: Secondary | ICD-10-CM | POA: Diagnosis not present

## 2017-09-24 DIAGNOSIS — Z471 Aftercare following joint replacement surgery: Secondary | ICD-10-CM | POA: Diagnosis not present

## 2017-09-25 ENCOUNTER — Telehealth (INDEPENDENT_AMBULATORY_CARE_PROVIDER_SITE_OTHER): Payer: Self-pay | Admitting: Orthopaedic Surgery

## 2017-09-25 ENCOUNTER — Other Ambulatory Visit (INDEPENDENT_AMBULATORY_CARE_PROVIDER_SITE_OTHER): Payer: Self-pay | Admitting: Radiology

## 2017-09-25 DIAGNOSIS — Z471 Aftercare following joint replacement surgery: Secondary | ICD-10-CM | POA: Diagnosis not present

## 2017-09-25 DIAGNOSIS — Z96643 Presence of artificial hip joint, bilateral: Secondary | ICD-10-CM | POA: Diagnosis not present

## 2017-09-25 DIAGNOSIS — Z96653 Presence of artificial knee joint, bilateral: Secondary | ICD-10-CM | POA: Diagnosis not present

## 2017-09-25 DIAGNOSIS — F329 Major depressive disorder, single episode, unspecified: Secondary | ICD-10-CM | POA: Diagnosis not present

## 2017-09-25 MED ORDER — HYDROCODONE-ACETAMINOPHEN 5-325 MG PO TABS: 1.0000 | ORAL_TABLET | ORAL | 0 refills | Status: DC | PRN

## 2017-09-25 NOTE — Telephone Encounter (Signed)
Please see below and advise. Dr. Roda ShuttersXu agreed to refill, but is not in office to sign Rx.  Thanks.

## 2017-09-25 NOTE — Telephone Encounter (Signed)
Script printed. I left voicemail for patient advising script at front for pick up.

## 2017-09-25 NOTE — Telephone Encounter (Signed)
Agree with Roda ShuttersXu, print for # 30 and I will sign thanks

## 2017-09-25 NOTE — Telephone Encounter (Signed)
What was the message from the patient?

## 2017-09-25 NOTE — Telephone Encounter (Signed)
Do you think Dr. Ophelia CharterYates could advise, Dr. Roda ShuttersXu said yes but left before we could print and sign rx for patient. Patient status post bilateral THA on 09/18/17. She is worried she will be out of pain medication over the weekend. She is taking 2 hydrocodone q 4 hours as prescribed.

## 2017-09-25 NOTE — Telephone Encounter (Signed)
Refill #30

## 2017-09-25 NOTE — Telephone Encounter (Signed)
Patient status post bilateral THA on 09/18/17. She is worried she will be out of pain medication over the weekend. She is taking 2 hydrocodone q 4 hours as prescribed. Can you please advise about refill, [facility]

## 2017-09-28 ENCOUNTER — Other Ambulatory Visit (INDEPENDENT_AMBULATORY_CARE_PROVIDER_SITE_OTHER): Payer: Self-pay | Admitting: Orthopaedic Surgery

## 2017-09-28 DIAGNOSIS — Z96653 Presence of artificial knee joint, bilateral: Secondary | ICD-10-CM | POA: Diagnosis not present

## 2017-09-28 DIAGNOSIS — F329 Major depressive disorder, single episode, unspecified: Secondary | ICD-10-CM | POA: Diagnosis not present

## 2017-09-28 DIAGNOSIS — Z471 Aftercare following joint replacement surgery: Secondary | ICD-10-CM | POA: Diagnosis not present

## 2017-09-28 DIAGNOSIS — Z96643 Presence of artificial hip joint, bilateral: Secondary | ICD-10-CM | POA: Diagnosis not present

## 2017-09-28 MED ORDER — OXYCODONE-ACETAMINOPHEN 5-325 MG PO TABS: 1.0000 | ORAL_TABLET | ORAL | 0 refills | Status: DC | PRN

## 2017-09-30 DIAGNOSIS — Z96653 Presence of artificial knee joint, bilateral: Secondary | ICD-10-CM | POA: Diagnosis not present

## 2017-09-30 DIAGNOSIS — Z96643 Presence of artificial hip joint, bilateral: Secondary | ICD-10-CM | POA: Diagnosis not present

## 2017-09-30 DIAGNOSIS — F329 Major depressive disorder, single episode, unspecified: Secondary | ICD-10-CM | POA: Diagnosis not present

## 2017-09-30 DIAGNOSIS — Z471 Aftercare following joint replacement surgery: Secondary | ICD-10-CM | POA: Diagnosis not present

## 2017-10-01 ENCOUNTER — Ambulatory Visit (INDEPENDENT_AMBULATORY_CARE_PROVIDER_SITE_OTHER): Payer: Medicare Other | Admitting: Physician Assistant

## 2017-10-01 ENCOUNTER — Encounter (INDEPENDENT_AMBULATORY_CARE_PROVIDER_SITE_OTHER): Payer: Self-pay | Admitting: Physician Assistant

## 2017-10-01 DIAGNOSIS — Z96643 Presence of artificial hip joint, bilateral: Secondary | ICD-10-CM

## 2017-10-01 MED ORDER — OXYCODONE-ACETAMINOPHEN 5-325 MG PO TABS: 1.0000 | ORAL_TABLET | ORAL | 0 refills | Status: DC | PRN

## 2017-10-01 NOTE — Progress Notes (Signed)
HPI: [name] returns today 13 days status post bilateral total hip arthroplasties.  She states overall she is doing okay.  And she is having to use a cane to ambulate.  She denies any chest pain shortness breath fevers chills.  She has seen for a refill on her pain medication.  Physical exam: General well-developed well-nourished female no acute distress. Bilateral lower extremities: Surgical incisions are well approximated with running subcu stitches.  No signs of infection.  Calves are supple nontender.  She is able to dorsiflex plantarflex bilateral ankles.  She ambulates without an antalgic gait with the use of a cane.  She is able get on and off the exam table on her own.  Impression: 13 days status post bilateral total hip arthroplasties  Plan: She will stay on her aspirin 325 once daily for another week and discontinue.  She was on no aspirin prior to surgery.  Scar tissue mobilization encouraged.  She will follow-up with us in 1 month sooner if there is any questions concerns.  Questions were encouraged and answered at length today.  Refill on her oxycodone was given.

## 2017-10-02 DIAGNOSIS — Z96643 Presence of artificial hip joint, bilateral: Secondary | ICD-10-CM | POA: Diagnosis not present

## 2017-10-02 DIAGNOSIS — Z96653 Presence of artificial knee joint, bilateral: Secondary | ICD-10-CM | POA: Diagnosis not present

## 2017-10-02 DIAGNOSIS — F329 Major depressive disorder, single episode, unspecified: Secondary | ICD-10-CM | POA: Diagnosis not present

## 2017-10-02 DIAGNOSIS — Z471 Aftercare following joint replacement surgery: Secondary | ICD-10-CM | POA: Diagnosis not present

## 2017-10-06 DIAGNOSIS — Z96653 Presence of artificial knee joint, bilateral: Secondary | ICD-10-CM | POA: Diagnosis not present

## 2017-10-06 DIAGNOSIS — Z471 Aftercare following joint replacement surgery: Secondary | ICD-10-CM | POA: Diagnosis not present

## 2017-10-06 DIAGNOSIS — F329 Major depressive disorder, single episode, unspecified: Secondary | ICD-10-CM | POA: Diagnosis not present

## 2017-10-06 DIAGNOSIS — Z96643 Presence of artificial hip joint, bilateral: Secondary | ICD-10-CM | POA: Diagnosis not present

## 2017-10-08 DIAGNOSIS — Z96643 Presence of artificial hip joint, bilateral: Secondary | ICD-10-CM | POA: Diagnosis not present

## 2017-10-08 DIAGNOSIS — F329 Major depressive disorder, single episode, unspecified: Secondary | ICD-10-CM | POA: Diagnosis not present

## 2017-10-08 DIAGNOSIS — Z471 Aftercare following joint replacement surgery: Secondary | ICD-10-CM | POA: Diagnosis not present

## 2017-10-08 DIAGNOSIS — Z96653 Presence of artificial knee joint, bilateral: Secondary | ICD-10-CM | POA: Diagnosis not present

## 2017-10-27 ENCOUNTER — Other Ambulatory Visit (INDEPENDENT_AMBULATORY_CARE_PROVIDER_SITE_OTHER): Payer: Self-pay

## 2017-10-27 MED ORDER — METHOCARBAMOL 500 MG PO TABS: 500.0000 mg | ORAL_TABLET | Freq: Four times a day (QID) | ORAL | 1 refills | Status: DC | PRN

## 2017-11-02 ENCOUNTER — Encounter (INDEPENDENT_AMBULATORY_CARE_PROVIDER_SITE_OTHER): Payer: Self-pay | Admitting: Physician Assistant

## 2017-11-02 ENCOUNTER — Ambulatory Visit (INDEPENDENT_AMBULATORY_CARE_PROVIDER_SITE_OTHER): Payer: Medicare Other | Admitting: Physician Assistant

## 2017-11-02 VITALS — Ht 66.0 in | Wt 122.0 lb

## 2017-11-02 DIAGNOSIS — Z96643 Presence of artificial hip joint, bilateral: Secondary | ICD-10-CM

## 2017-11-02 NOTE — Progress Notes (Signed)
HPI: [name] returns today 45 days status post bilateral total hip arthroplasties.  She overall is doing very well.  She has not no complaints in regards to the hips.  No significant pain taking occasional Tylenol.  Also takes occasional Robaxin.  Very happy with the results.  She is using no assistive device to ambulate  Physical exam: Bilateral hips incisions are well-healed no signs of infection.  She has good range of motion of both hips without pain.  She ambulates without any assistive devices and a nonantalgic gait.  Impression: Status post bilateral total hip arthroplasties  Plan: We will have her follow-up with us in 1 year paced obtain AP pelvis and lateral views of both hips.  She can always follow-up sooner if there is any questions or concerns.

## 2017-12-21 ENCOUNTER — Other Ambulatory Visit: Payer: Self-pay | Admitting: Family Medicine

## 2017-12-21 DIAGNOSIS — M79604 Pain in right leg: Secondary | ICD-10-CM

## 2017-12-22 DIAGNOSIS — M6289 Other specified disorders of muscle: Secondary | ICD-10-CM | POA: Diagnosis not present

## 2017-12-22 DIAGNOSIS — M79604 Pain in right leg: Secondary | ICD-10-CM | POA: Diagnosis not present

## 2018-02-12 DIAGNOSIS — E785 Hyperlipidemia, unspecified: Secondary | ICD-10-CM | POA: Diagnosis not present

## 2018-02-12 DIAGNOSIS — Z0000 Encounter for general adult medical examination without abnormal findings: Secondary | ICD-10-CM | POA: Diagnosis not present

## 2018-02-12 DIAGNOSIS — Z5181 Encounter for therapeutic drug level monitoring: Secondary | ICD-10-CM | POA: Diagnosis not present

## 2018-02-12 DIAGNOSIS — R739 Hyperglycemia, unspecified: Secondary | ICD-10-CM | POA: Diagnosis not present

## 2018-03-09 ENCOUNTER — Encounter (INDEPENDENT_AMBULATORY_CARE_PROVIDER_SITE_OTHER): Payer: Self-pay | Admitting: Orthopaedic Surgery

## 2018-03-09 ENCOUNTER — Ambulatory Visit (INDEPENDENT_AMBULATORY_CARE_PROVIDER_SITE_OTHER): Payer: Self-pay

## 2018-03-09 ENCOUNTER — Ambulatory Visit (INDEPENDENT_AMBULATORY_CARE_PROVIDER_SITE_OTHER): Payer: Medicare Other | Admitting: Orthopaedic Surgery

## 2018-03-09 DIAGNOSIS — M25561 Pain in right knee: Secondary | ICD-10-CM

## 2018-03-09 MED ORDER — METHYLPREDNISOLONE ACETATE 40 MG/ML IJ SUSP
40.0000 mg | INTRAMUSCULAR | Status: AC | PRN
  Administered 2018-03-09: 40 mg via INTRA_ARTICULAR

## 2018-03-09 MED ORDER — LIDOCAINE HCL 1 % IJ SOLN
5.0000 mL | INTRAMUSCULAR | Status: AC | PRN
  Administered 2018-03-09: 5 mL

## 2018-03-09 NOTE — Progress Notes (Signed)
Office Visit Note   Patient: Becky Cunningham           Date of Birth: [birth year]           MRN: [id number] Visit Date: 03/09/2018              Requested by: Shirlean MylarWebb, Carol, MD [address] [place], [place] [address] PCP: Shirlean MylarWebb, Carol, MD   Assessment & Plan: Visit Diagnoses:  1. Acute pain of right knee     Plan: Status will keep the Ace bandage that was applied on her knee until this evening and remove it.  Ice of the knee is recommended.  She will follow-up with us in 2 weeks check her response to the cortisone injection.  Questions were encouraged and answered at length.  Follow-Up Instructions: No follow-ups on file.   Orders:  Orders Placed This Encounter  Procedures  . Large Joint Inj  . XR Knee 1-2 Views Right   No orders of the defined types were placed in this encounter.     Procedures: Large Joint Inj: R knee on 03/09/2018 9:09 AM Indications: pain Details: 22 G 1.5 in needle, superolateral approach  Arthrogram: No  Medications: 40 mg methylPREDNISolone acetate 40 MG/ML; 5 mL lidocaine 1 % Aspirate: 60 mL bloody Outcome: tolerated well, no immediate complications Procedure, treatment alternatives, risks and benefits explained, specific risks discussed. Consent was given by the patient. Immediately prior to procedure a time out was called to verify the correct patient, procedure, equipment, support staff and site/side marked as required. Patient was prepped and draped in the usual sterile fashion.       Clinical Data: No additional findings.   Subjective: Chief Complaint  Patient presents with  . Right Knee - Follow-up    HPI Becky Cunningham returns today due to right knee pain for about 5 weeks.  No known injury.  She denies any mechanical symptoms.  She is having difficulty going up and down stairs due to the pain and swelling in the knee.  She is had no fevers or  chills.  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 Review of Systems Please see HPI otherwise negative  Objective: Vital Signs: There were no vitals taken for this visit.  Physical Exam  Constitutional: She is oriented to person, place, and time. She appears well-developed and well-nourished. No distress.  Pulmonary/Chest: Effort normal.  Neurological: She is alert and oriented to person, place, and time.  Skin: Skin is warm and dry. She is not diaphoretic.  Right knee no rashes skin lesions ulcerations erythema well-healed surgical incision.  Psychiatric: She has a normal mood and affect.   Ortho Exam Bilateral knee.  Full range of motion.  She has slight tenderness over the medial and lateral joint line of the right knee.  No instability of either knee with valgus varus stressing.  Positive effusion right knee.  No erythema ecchymosis of either knee.  Specialty Comments:  No specialty comments available.  Imaging: Xr Knee 1-2 Views Right  Result Date: 03/09/2018 Right knee: AP and lateral views no acute fracture.  Well-seated right total knee.  No subluxation dislocation of the right knee.    PMFS History: Patient Active Problem List   Diagnosis Date Noted  . Bilateral primary osteoarthritis of hip 09/18/2017  . History of bilateral hip arthroplasty 09/18/2017  . Status post bilateral hip replacements 09/18/2017  .  Unilateral primary osteoarthritis, left hip 07/07/2017  . Unilateral primary osteoarthritis, right hip 07/07/2017  . Pain in left wrist 03/30/2017  . Primary osteoarthritis of both knees 08/25/2014  . Status post bilateral knee replacements 08/25/2014   Past Medical History:  Diagnosis Date  . Arthritis   . Depression     No family history on file.  Past Surgical History:  Procedure Laterality Date  . BILATERAL ANTERIOR TOTAL HIP ARTHROPLASTY Bilateral 09/18/2017   Procedure: BILATERAL ANTERIOR TOTAL HIP ARTHROPLASTY;  Surgeon: Kathryne Hitch, MD;  Location: [facility];  Service: Orthopedics;  Laterality: Bilateral;  . DILATION AND CURETTAGE OF UTERUS    . KNEE ARTHROSCOPY  2009   left   . TMJ similar surgery     . TOTAL KNEE ARTHROPLASTY Bilateral 08/25/2014   Procedure: TOTAL KNEE BILATERAL;  Surgeon: Kathryne Hitch, MD;  Location: [facility];  Service: Orthopedics;  Laterality: Bilateral;  . WISDOM TOOTH EXTRACTION     Social History   Occupational History  . Not on file  Tobacco Use  . Smoking status: Never Smoker  . Smokeless tobacco: Never Used  Substance and Sexual Activity  . Alcohol use: Yes    Alcohol/week: 8.4 oz    Types: 14 Glasses of wine per week  . Drug use: No  . Sexual activity: Not on file

## 2018-03-23 ENCOUNTER — Encounter (INDEPENDENT_AMBULATORY_CARE_PROVIDER_SITE_OTHER): Payer: Self-pay | Admitting: Orthopaedic Surgery

## 2018-03-23 ENCOUNTER — Ambulatory Visit (INDEPENDENT_AMBULATORY_CARE_PROVIDER_SITE_OTHER): Payer: Medicare Other | Admitting: Orthopaedic Surgery

## 2018-03-23 VITALS — Ht 66.0 in | Wt 122.0 lb

## 2018-03-23 DIAGNOSIS — M25561 Pain in right knee: Secondary | ICD-10-CM

## 2018-03-23 NOTE — Progress Notes (Signed)
HPI: [name] returns today follow-up of her right knee aspiration injection 1719.  She states initially that the injection in the aspiration helped.  However now she is having decreased range of motion the knee in the knee feels like it gives way.  She has pain with walking.  Has difficulty going up and down steps.  She also notes swelling in the knee again.  She had no fevers chills shortness of breath. Patient status post bilateral total knee arthroplasties 2015 did well until recently when she developed some right knee pain.  She had no known injury to the right knee.  Left knee overall is doing well no complaints.  Review of systems: Please see HPI otherwise negative  Physical exam: Right knee surgical incisions well-healed.  No erythema.  Positive effusion.  No instability valgus varus stressing.  Slight anterior drawer and Lockman's right knee.  Negative drawer and Lachman's on the left.  Full extension right knee flexion to at least 105 110 degrees.  Radiographs right knee reviewed from 03/09/2018 there is slight lucency on the medial aspect of the right knee tibial component cannot rule out loosening.  Otherwise the implant appears well seated.  Plan: Right knee is prepped with Betadine today at the chloride she is Skin and 3 cc lidocaine was used to further anesthetize the knee knee is aspirated 35 cc of bloody aspirate was obtained this will be sent for cell count, culture and sensitivity Gram stain and crystals.  We will also obtain a bone scan to rule out loosening of the right total knee arthroplasty components.  We will have her back after the bone scan to go over the results and discuss further treatment.  Questions encouraged and answered by Dr. Magnus Cunningham myself.

## 2018-03-24 LAB — SYNOVIAL CELL COUNT + DIFF, W/ CRYSTALS
Basophils, %: 0 %
Eosinophils-Synovial: 1 % (ref 0–2)
LYMPHOCYTES-SYNOVIAL FLD: 28 % (ref 0–74)
MONOCYTE/MACROPHAGE: 23 % (ref 0–69)
Neutrophil, Synovial: 48 % — ABNORMAL HIGH (ref 0–24)
SYNOVIOCYTES, %: 0 % (ref 0–15)
WBC, SYNOVIAL: 2990 {cells}/uL — AB (ref <150)

## 2018-03-24 LAB — TIQ-NTM

## 2018-03-29 LAB — ANAEROBIC AND AEROBIC CULTURE
AER RESULT: NO GROWTH
MICRO NUMBER: 90840972
MICRO NUMBER:: 90840973
SPECIMEN QUALITY:: ADEQUATE
SPECIMEN QUALITY:: ADEQUATE

## 2018-03-30 ENCOUNTER — Encounter (HOSPITAL_COMMUNITY)
Admission: RE | Admit: 2018-03-30 | Discharge: 2018-03-30 | Disposition: A | Discharge status: Home / Self Care | Payer: Medicare Other | Source: Ambulatory Visit | Attending: Physician Assistant | Admitting: Physician Assistant

## 2018-03-30 DIAGNOSIS — Z471 Aftercare following joint replacement surgery: Secondary | ICD-10-CM | POA: Diagnosis not present

## 2018-03-30 DIAGNOSIS — M25561 Pain in right knee: Secondary | ICD-10-CM

## 2018-03-30 DIAGNOSIS — Z96651 Presence of right artificial knee joint: Secondary | ICD-10-CM | POA: Diagnosis not present

## 2018-03-30 DIAGNOSIS — Z96652 Presence of left artificial knee joint: Secondary | ICD-10-CM | POA: Diagnosis not present

## 2018-03-30 MED ORDER — TECHNETIUM TC 99M MEDRONATE IV KIT
21.7000 | PACK | Freq: Once | INTRAVENOUS | Status: AC | PRN
  Administered 2018-03-30: 21.7 via INTRAVENOUS

## 2018-04-05 ENCOUNTER — Ambulatory Visit (INDEPENDENT_AMBULATORY_CARE_PROVIDER_SITE_OTHER): Payer: Medicare Other | Admitting: Physician Assistant

## 2018-04-05 ENCOUNTER — Encounter (INDEPENDENT_AMBULATORY_CARE_PROVIDER_SITE_OTHER): Payer: Self-pay | Admitting: Physician Assistant

## 2018-04-05 DIAGNOSIS — M659 Synovitis and tenosynovitis, unspecified: Secondary | ICD-10-CM | POA: Diagnosis not present

## 2018-04-05 DIAGNOSIS — M25561 Pain in right knee: Secondary | ICD-10-CM | POA: Diagnosis not present

## 2018-04-05 NOTE — Progress Notes (Signed)
HPI: [name] returns today status post aspiration right knee with cultures sent of the aspirate and status post bone scan.  She continues to have significant pain in the knee and swelling.  She is having no chest pain, fevers, chills, nausea or vomiting.  States that the swelling does go down if she elevates to some degree.  She has pain in the knee with walking and has difficulty going up and down steps.  She is status post bilateral total knee arthroplasties 2015 left knee is doing well without any complaints.  Bone scan dated 03/30/2018: Delayed phase showed increased radiotracer uptake on the delayed imaging within the right femoral metaphysis.  To the proximal pieces.  No increased blood flow at the prosthesis to suggest loosening or infection. Aspect of the right knee sent culture cell count and showed no growth.  White count was 2990.   Physical exam: Right knee good range of motion.  Positive effusion.  No abnormal warmth, erythema or ecchymosis.  Surgical incisions well-healed.  Impression: Right total knee pain with recurrent effusion  Plan:Due to the fact that she continues to have recurrent effusion of the knee despite conservative treatment time and pain in the knee recommend right knee open synovectomy with poly-exchange versus total knee revision.  Questions were encouraged and answered by Dr. Magnus Cunningham myself risk benefits of surgery discussed with patient.  She understands based on findings at interoperative setting will determine the exact procedure performed.  We will proceed with this in the near future.  Questions encouraged and answered at length.  See her back 2 weeks postop.

## 2018-04-19 ENCOUNTER — Other Ambulatory Visit (INDEPENDENT_AMBULATORY_CARE_PROVIDER_SITE_OTHER): Payer: Self-pay | Admitting: Physician Assistant

## 2018-04-19 NOTE — Pre-Procedure Instructions (Signed)
Becky Cunningham  04/19/2018      [facility] [phone]#[id number] - [place], [place] - [address] [facility] [address] [place] [address] Phone: [phone] Fax: [phone]    Your procedure is scheduled on 04/27/18.  [facility] at 1:15PM.            .Call this number if you have problems the morning of surgery:  [phone]   Remember:  Do not eat or drink after midnight.     Take these medicines the morning of surgery with A SIP OF WATER-- PROZAC,VICODAN,ROBAXIN,OXYCODONE   Do not wear jewelry, make-up or nail polish.  Do not wear lotions, powders, or perfumes, or deodorant.  Do not shave 48 hours prior to surgery.  Men may shave face and neck.  Do not bring valuables to the hospital.  [facility] is not responsible for any belongings or valuables.  Contacts, dentures or bridgework may not be worn into surgery.  Leave your suitcase in the car.  After surgery it may be brought to your room.  For patients admitted to the hospital, discharge time will be determined by your treatment team.  Patients discharged the day of surgery will not be allowed to drive home.   Name and phone number of your driver:  Do not take any aspirin,anti-inflammatories,vitamins,or herbal supplements 5-7 days prior to surgery. Special instructions:  [facility] - Preparing for Surgery  Before surgery, you can play an important role.  Because skin is not sterile, your skin needs to be as free of germs as possible.  You can reduce the number of germs on you skin by washing with CHG (chlorahexidine gluconate) soap before surgery.  CHG is an antiseptic cleaner which kills germs and bonds with the skin to continue killing germs even after washing.  Oral Hygiene is also important in reducing the risk of infection.  Remember to brush your teeth with your regular toothpaste the morning of surgery.  Please DO NOT use if you  have an allergy to CHG or antibacterial soaps.  If your skin becomes reddened/irritated stop using the CHG and inform your nurse when you arrive at Short Stay.  Do not shave (including legs and underarms) for at least 48 hours prior to the first CHG shower.  You may shave your face.  Please follow these instructions carefully:   1.  Shower with CHG Soap the night before surgery and the morning of Surgery.  2.  If you choose to wash your hair, wash your hair first as usual with your normal shampoo.  3.  After you shampoo, rinse your hair and body thoroughly to remove the shampoo. 4.  Use CHG as you would any other liquid soap.  You can apply chg directly to the skin and wash gently with a      scrungie or washcloth.           5.  Apply the CHG Soap to your body ONLY FROM THE NECK DOWN.   Do not use on open wounds or open sores. Avoid contact with your eyes, ears, mouth and genitals (private parts).  Wash genitals (private parts) with your normal soap.  6.  Wash thoroughly, paying special attention to the area where your surgery will be performed.  7.  Thoroughly rinse your body with warm water from the neck down.  8.  DO NOT shower/wash with your normal soap after using and rinsing  off the CHG Soap.  9.  Pat yourself dry with a clean towel.            10.  Wear clean pajamas.            11.  Place clean sheets on your bed the night of your first shower and do not sleep with pets.  Day of Surgery  Do not apply any lotions/deoderants the morning of surgery.   Please wear clean clothes to the hospital/surgery center. Remember to brush your teeth with toothpaste.    Please read over the following fact sheets that you were given. MRSA Information

## 2018-04-20 ENCOUNTER — Other Ambulatory Visit: Payer: Self-pay

## 2018-04-20 ENCOUNTER — Encounter (HOSPITAL_COMMUNITY)
Admission: RE | Admit: 2018-04-20 | Discharge: 2018-04-20 | Disposition: A | Discharge status: Home / Self Care | Payer: Medicare Other | Source: Ambulatory Visit | Attending: Orthopaedic Surgery | Admitting: Orthopaedic Surgery

## 2018-04-20 ENCOUNTER — Encounter (HOSPITAL_COMMUNITY): Payer: Self-pay

## 2018-04-20 DIAGNOSIS — Z01812 Encounter for preprocedural laboratory examination: Secondary | ICD-10-CM | POA: Insufficient documentation

## 2018-04-20 DIAGNOSIS — Z0183 Encounter for blood typing: Secondary | ICD-10-CM | POA: Insufficient documentation

## 2018-04-20 HISTORY — DX: Effusion, unspecified knee: M25.469

## 2018-04-20 LAB — COMPREHENSIVE METABOLIC PANEL
ALT: 20 U/L (ref 0–44)
AST: 23 U/L (ref 15–41)
Albumin: 4.1 g/dL (ref 3.5–5.0)
Alkaline Phosphatase: 64 U/L (ref 38–126)
Anion gap: 9 (ref 5–15)
BUN: 13 mg/dL (ref 8–23)
CALCIUM: 9.3 mg/dL (ref 8.9–10.3)
CO2: 28 mmol/L (ref 22–32)
Chloride: 103 mmol/L (ref 98–111)
Creatinine, Ser: 0.69 mg/dL (ref 0.44–1.00)
GFR calc non Af Amer: >60 mL/min (ref >60)
Glucose, Bld: 70 mg/dL (ref 70–99)
Potassium: 3.7 mmol/L (ref 3.5–5.1)
SODIUM: 140 mmol/L (ref 135–145)
Total Bilirubin: 0.8 mg/dL (ref 0.3–1.2)
Total Protein: 6.7 g/dL (ref 6.5–8.1)

## 2018-04-20 LAB — SURGICAL PCR SCREEN
MRSA, PCR: NEGATIVE
Staphylococcus aureus: NEGATIVE

## 2018-04-20 LAB — TYPE AND SCREEN
ABO/RH(D): O POS
Antibody Screen: NEGATIVE

## 2018-04-20 LAB — ABO/RH: ABO/RH(D): O POS

## 2018-04-20 LAB — CBC
HCT: 40.6 % (ref 36.0–46.0)
Hemoglobin: 13.1 g/dL (ref 12.0–15.0)
MCH: 30.5 pg (ref 26.0–34.0)
MCHC: 32.3 g/dL (ref 30.0–36.0)
MCV: 94.4 fL (ref 78.0–100.0)
PLATELETS: 249 10*3/uL (ref 150–400)
RBC: 4.3 MIL/uL (ref 3.87–5.11)
RDW: 14.1 % (ref 11.5–15.5)
WBC: 4.1 10*3/uL (ref 4.0–10.5)

## 2018-04-20 NOTE — Progress Notes (Signed)
Ricardo JerichoRobin M Moffet            04/19/2018                          [facility] [phone]#[id number] - [place], [place] - [address] [facility] [address] [place] [address] Phone: [phone] Fax: [phone]              Your procedure is scheduled on Tues., Aug. 20, 2019 from 3:15PM-5:58PM            [facility] Ad[facility] Entrance "A" at 1:15PM             Call this number if you have problems the morning of surgery:            [phone]             Remember:            Do not eat or drink after midnight on Aug. 19th                                   Take these medicines the morning of surgery with A SMALL SIP OF WATER:   PROZAC        As of today, stop taking all Other Aspirin Products, Vitamins, Fish oils, and Herbal medications. Also stop all NSAIDS i.e. Advil, Ibuprofen, Motrin, Aleve, Anaprox, Naproxen, BC, Goody Powders, and all Supplements.                 Do not wear jewelry, make-up or nail polish.            Do not wear lotions, powders, or perfumes, or deodorant.            Do not shave 48 hours prior to surgery.              Do not bring valuables to the hospital.            [facility] is not responsible for any belongings or valuables.  Contacts, dentures or bridgework may not be worn into surgery.  Leave your suitcase in the car.  After surgery it may be brought to your room.  For patients admitted to the hospital, discharge time will be determined by your treatment team.  Patients discharged the day of surgery will not be allowed to drive home.  Special instructions:                                            [place] - Preparing for Surgery  Before surgery, you can play an important role.  Because skin is not sterile, your skin needs to be as free of germs as possible.  You can reduce the number of germs on you skin by washing with CHG (chlorahexidine gluconate) soap before  surgery.  CHG is an antiseptic cleaner which kills germs and bonds with the skin to continue killing germs even after washing.  Oral Hygiene is also important in reducing the risk of infection.  Remember to brush your teeth with your regular toothpaste the morning of surgery.  Please DO NOT use if you have an allergy to CHG or antibacterial soaps.  If your skin becomes reddened/irritated  stop using the CHG and inform your nurse when you arrive at Short Stay.  Do not shave (including legs and underarms) for at least 48 hours prior to the first CHG shower.  You may shave your face.  Please follow these instructions carefully:             1.  Shower with CHG Soap the night before surgery and the morning of Surgery.            2.  If you choose to wash your hair, wash your hair first as usual with your normal shampoo.            3.  After you shampoo, rinse your hair and body thoroughly to remove the shampoo. 4.  Use CHG as you would any other liquid soap.  You can apply chg directly to the skin and wash gently with a      scrungie or washcloth.           5.  Apply the CHG Soap to your body ONLY FROM THE NECK DOWN.   Do not use on open wounds or open sores. Avoid contact with your eyes, ears, mouth and genitals (private parts).  Wash genitals (private parts) with your normal soap.            6.  Wash thoroughly, paying special attention to the area where your surgery will be performed.            7.  Thoroughly rinse your body with warm water from the neck down.            8.  DO NOT shower/wash with your normal soap after using and rinsing off the CHG Soap.            9.  Pat yourself dry with a clean towel.            10.  Wear clean pajamas.            11.  Place clean sheets on your bed the night of your first shower and do not sleep with pets.  Day of Surgery  Do not apply any lotions/deoderants the morning of surgery.   Please wear clean clothes to the hospital/surgery  center. Remember to brush your teeth with toothpaste.  Please read over the following fact sheets that you were given.

## 2018-04-20 NOTE — Progress Notes (Signed)
PCP - Dr. Shirlean [facility]ol [facility]  Cardiologist - Denies  Chest x-ray - Denies  EKG - Denies  Stress Test - Denies  ECHO - Denies  Cardiac Cath - Denies  Sleep Study - Denies CPAP - None  LABS- 04/20/18: CBC, CMP, T/S, PCR  ASA- Denies   Anesthesia-No   Pt denies having chest pain, sob, or fever at this time. All instructions explained to the pt, with a verbal understanding of the material. Pt agrees to go over the instructions while at home for a better understanding. The opportunity to ask questions was provided.

## 2018-04-27 ENCOUNTER — Telehealth (INDEPENDENT_AMBULATORY_CARE_PROVIDER_SITE_OTHER): Payer: Self-pay | Admitting: Orthopaedic Surgery

## 2018-04-27 NOTE — Telephone Encounter (Signed)
Patient came into the clinic stating her surgery was cancelled today and needs to r/s ASAP.  CB#[phone].  Thank you

## 2018-04-27 NOTE — Telephone Encounter (Signed)
Spoke with patient and rescheduled

## 2018-05-05 ENCOUNTER — Encounter (HOSPITAL_COMMUNITY): Payer: Self-pay | Admitting: *Deleted

## 2018-05-05 ENCOUNTER — Other Ambulatory Visit: Payer: Self-pay

## 2018-05-05 NOTE — Progress Notes (Signed)
Spoke with pt for pre-op call. Pt had a PAT appt on 04/20/18 but surgery got rescheduled. Pt states nothing has changed with her allergies, medications, medical and surgical histories. Pt denies any recent chest pain or sob. Pt does have her printed instructions and will follow those.

## 2018-05-06 ENCOUNTER — Inpatient Hospital Stay (HOSPITAL_COMMUNITY): Payer: Medicare Other

## 2018-05-06 ENCOUNTER — Encounter (HOSPITAL_COMMUNITY)
Admission: RE | Disposition: A | Discharge status: Home Health | Payer: Self-pay | Source: Home / Self Care | Attending: Orthopaedic Surgery

## 2018-05-06 ENCOUNTER — Inpatient Hospital Stay (HOSPITAL_COMMUNITY): Payer: Medicare Other | Admitting: Anesthesiology

## 2018-05-06 ENCOUNTER — Inpatient Hospital Stay (HOSPITAL_COMMUNITY)
Admission: RE | Admit: 2018-05-06 | Discharge: 2018-05-08 | DRG: 468 | Disposition: A | Discharge status: Home Health | Payer: Medicare Other | Attending: Orthopaedic Surgery | Admitting: Orthopaedic Surgery

## 2018-05-06 ENCOUNTER — Other Ambulatory Visit: Payer: Self-pay

## 2018-05-06 ENCOUNTER — Encounter (HOSPITAL_COMMUNITY): Payer: Self-pay | Admitting: General Practice

## 2018-05-06 DIAGNOSIS — Z96643 Presence of artificial hip joint, bilateral: Secondary | ICD-10-CM | POA: Diagnosis present

## 2018-05-06 DIAGNOSIS — M659 Synovitis and tenosynovitis, unspecified: Secondary | ICD-10-CM | POA: Diagnosis present

## 2018-05-06 DIAGNOSIS — F329 Major depressive disorder, single episode, unspecified: Secondary | ICD-10-CM | POA: Diagnosis present

## 2018-05-06 DIAGNOSIS — Y838 Other surgical procedures as the cause of abnormal reaction of the patient, or of later complication, without mention of misadventure at the time of the procedure: Secondary | ICD-10-CM | POA: Diagnosis present

## 2018-05-06 DIAGNOSIS — T84062D Wear of articular bearing surface of internal prosthetic right knee joint, subsequent encounter: Secondary | ICD-10-CM | POA: Diagnosis not present

## 2018-05-06 DIAGNOSIS — T84092A Other mechanical complication of internal right knee prosthesis, initial encounter: Secondary | ICD-10-CM | POA: Diagnosis not present

## 2018-05-06 DIAGNOSIS — Z91018 Allergy to other foods: Secondary | ICD-10-CM

## 2018-05-06 DIAGNOSIS — Z471 Aftercare following joint replacement surgery: Secondary | ICD-10-CM | POA: Diagnosis not present

## 2018-05-06 DIAGNOSIS — Z96651 Presence of right artificial knee joint: Secondary | ICD-10-CM

## 2018-05-06 DIAGNOSIS — M25461 Effusion, right knee: Secondary | ICD-10-CM | POA: Diagnosis present

## 2018-05-06 DIAGNOSIS — G8918 Other acute postprocedural pain: Secondary | ICD-10-CM | POA: Diagnosis not present

## 2018-05-06 DIAGNOSIS — T84062A Wear of articular bearing surface of internal prosthetic right knee joint, initial encounter: Secondary | ICD-10-CM | POA: Diagnosis present

## 2018-05-06 DIAGNOSIS — M25561 Pain in right knee: Secondary | ICD-10-CM | POA: Diagnosis not present

## 2018-05-06 DIAGNOSIS — Z96653 Presence of artificial knee joint, bilateral: Secondary | ICD-10-CM | POA: Diagnosis present

## 2018-05-06 DIAGNOSIS — M65161 Other infective (teno)synovitis, right knee: Secondary | ICD-10-CM | POA: Diagnosis not present

## 2018-05-06 HISTORY — DX: Personal history of other medical treatment: Z92.89

## 2018-05-06 HISTORY — PX: TOTAL KNEE REVISION: SHX996

## 2018-05-06 HISTORY — PX: SYNOVECTOMY WITH POLY EXCHANGE: SHX6746

## 2018-05-06 LAB — TYPE AND SCREEN
ABO/RH(D): O POS
Antibody Screen: NEGATIVE

## 2018-05-06 SURGERY — TOTAL KNEE REVISION
Anesthesia: General | Site: Knee | Laterality: Right

## 2018-05-06 MED ORDER — METHOCARBAMOL 1000 MG/10ML IJ SOLN
500.0000 mg | Freq: Four times a day (QID) | INTRAVENOUS | Status: DC | PRN
  Filled 2018-05-06: qty 5

## 2018-05-06 MED ORDER — ONDANSETRON HCL 4 MG/2ML IJ SOLN: 4.0000 mg | Freq: Four times a day (QID) | INTRAMUSCULAR | Status: DC | PRN

## 2018-05-06 MED ORDER — PROPOFOL 10 MG/ML IV BOLUS
INTRAVENOUS | Status: AC
  Filled 2018-05-06: qty 20

## 2018-05-06 MED ORDER — PROMETHAZINE HCL 25 MG/ML IJ SOLN
INTRAMUSCULAR | Status: AC
  Filled 2018-05-06: qty 1

## 2018-05-06 MED ORDER — VITAMIN C 500 MG PO TABS
500.0000 mg | ORAL_TABLET | Freq: Three times a day (TID) | ORAL | Status: DC
  Administered 2018-05-06 – 2018-05-08 (×6): 500 mg via ORAL
  Filled 2018-05-06 (×6): qty 1

## 2018-05-06 MED ORDER — B COMPLEX-C PO TABS
1.0000 | ORAL_TABLET | ORAL | Status: DC
  Administered 2018-05-06: 1 via ORAL
  Filled 2018-05-06 (×5): qty 1

## 2018-05-06 MED ORDER — MORPHINE SULFATE (PF) 2 MG/ML IV SOLN: 0.5000 mg | INTRAVENOUS | Status: DC | PRN

## 2018-05-06 MED ORDER — FENTANYL CITRATE (PF) 100 MCG/2ML IJ SOLN
INTRAMUSCULAR | Status: AC
  Filled 2018-05-06: qty 2

## 2018-05-06 MED ORDER — ONDANSETRON HCL 4 MG PO TABS: 4.0000 mg | ORAL_TABLET | Freq: Four times a day (QID) | ORAL | Status: DC | PRN

## 2018-05-06 MED ORDER — CHLORHEXIDINE GLUCONATE 4 % EX LIQD: 60.0000 mL | Freq: Once | CUTANEOUS | Status: DC

## 2018-05-06 MED ORDER — ALUM & MAG HYDROXIDE-SIMETH 200-200-20 MG/5ML PO SUSP: 30.0000 mL | ORAL | Status: DC | PRN

## 2018-05-06 MED ORDER — MIDAZOLAM HCL 2 MG/2ML IJ SOLN
2.0000 mg | Freq: Once | INTRAMUSCULAR | Status: AC
  Administered 2018-05-06: 2 mg via INTRAVENOUS

## 2018-05-06 MED ORDER — METHOCARBAMOL 500 MG PO TABS: 500.0000 mg | ORAL_TABLET | Freq: Four times a day (QID) | ORAL | Status: DC | PRN

## 2018-05-06 MED ORDER — PHENYLEPHRINE HCL 10 MG/ML IJ SOLN
INTRAMUSCULAR | Status: DC | PRN
  Administered 2018-05-06 (×2): 80 ug via INTRAVENOUS

## 2018-05-06 MED ORDER — PHENYLEPHRINE 40 MCG/ML (10ML) SYRINGE FOR IV PUSH (FOR BLOOD PRESSURE SUPPORT)
PREFILLED_SYRINGE | INTRAVENOUS | Status: AC
  Filled 2018-05-06: qty 10

## 2018-05-06 MED ORDER — ASPIRIN 81 MG PO CHEW
81.0000 mg | CHEWABLE_TABLET | Freq: Two times a day (BID) | ORAL | Status: DC
  Administered 2018-05-06 – 2018-05-08 (×4): 81 mg via ORAL
  Filled 2018-05-06 (×4): qty 1

## 2018-05-06 MED ORDER — DEXAMETHASONE SODIUM PHOSPHATE 10 MG/ML IJ SOLN
INTRAMUSCULAR | Status: AC
  Filled 2018-05-06: qty 1

## 2018-05-06 MED ORDER — PHENOL 1.4 % MT LIQD: 1.0000 | OROMUCOSAL | Status: DC | PRN

## 2018-05-06 MED ORDER — PROMETHAZINE HCL 25 MG/ML IJ SOLN
6.2500 mg | INTRAMUSCULAR | Status: DC | PRN
  Administered 2018-05-06: 6.25 mg via INTRAVENOUS

## 2018-05-06 MED ORDER — BUPIVACAINE-EPINEPHRINE (PF) 0.5% -1:200000 IJ SOLN
INTRAMUSCULAR | Status: DC | PRN
  Administered 2018-05-06: 25 mL via PERINEURAL

## 2018-05-06 MED ORDER — METOCLOPRAMIDE HCL 5 MG PO TABS: 5.0000 mg | ORAL_TABLET | Freq: Three times a day (TID) | ORAL | Status: DC | PRN

## 2018-05-06 MED ORDER — ACETAMINOPHEN 325 MG PO TABS: 325.0000 mg | ORAL_TABLET | Freq: Four times a day (QID) | ORAL | Status: DC | PRN

## 2018-05-06 MED ORDER — HYDROCODONE-ACETAMINOPHEN 5-325 MG PO TABS
1.0000 | ORAL_TABLET | ORAL | Status: DC | PRN
  Administered 2018-05-07 – 2018-05-08 (×4): 2 via ORAL
  Filled 2018-05-06 (×5): qty 2

## 2018-05-06 MED ORDER — EPHEDRINE SULFATE 50 MG/ML IJ SOLN
INTRAMUSCULAR | Status: DC | PRN
  Administered 2018-05-06 (×2): 10 mg via INTRAVENOUS

## 2018-05-06 MED ORDER — CEFAZOLIN SODIUM-DEXTROSE 1-4 GM/50ML-% IV SOLN
1.0000 g | Freq: Four times a day (QID) | INTRAVENOUS | Status: AC
  Administered 2018-05-06 (×2): 1 g via INTRAVENOUS
  Filled 2018-05-06 (×3): qty 50

## 2018-05-06 MED ORDER — FENTANYL CITRATE (PF) 250 MCG/5ML IJ SOLN
INTRAMUSCULAR | Status: AC
  Filled 2018-05-06: qty 5

## 2018-05-06 MED ORDER — FENTANYL CITRATE (PF) 100 MCG/2ML IJ SOLN
25.0000 ug | INTRAMUSCULAR | Status: DC | PRN
  Administered 2018-05-06: 50 ug via INTRAVENOUS
  Administered 2018-05-06 (×2): 25 ug via INTRAVENOUS
  Administered 2018-05-06: 50 ug via INTRAVENOUS

## 2018-05-06 MED ORDER — ONDANSETRON HCL 4 MG/2ML IJ SOLN
INTRAMUSCULAR | Status: AC
  Filled 2018-05-06: qty 2

## 2018-05-06 MED ORDER — FENTANYL CITRATE (PF) 250 MCG/5ML IJ SOLN
INTRAMUSCULAR | Status: DC | PRN
  Administered 2018-05-06: 25 ug via INTRAVENOUS

## 2018-05-06 MED ORDER — POLYETHYLENE GLYCOL 3350 17 G PO PACK: 17.0000 g | PACK | Freq: Every day | ORAL | Status: DC | PRN

## 2018-05-06 MED ORDER — EPHEDRINE 5 MG/ML INJ
INTRAVENOUS | Status: AC
  Filled 2018-05-06: qty 10

## 2018-05-06 MED ORDER — FLUOXETINE HCL 20 MG PO CAPS
60.0000 mg | ORAL_CAPSULE | Freq: Every day | ORAL | Status: DC
  Administered 2018-05-07 – 2018-05-08 (×2): 60 mg via ORAL
  Filled 2018-05-06 (×2): qty 3

## 2018-05-06 MED ORDER — LABETALOL HCL 5 MG/ML IV SOLN
INTRAVENOUS | Status: AC
  Filled 2018-05-06: qty 4

## 2018-05-06 MED ORDER — CEFAZOLIN SODIUM-DEXTROSE 2-3 GM-%(50ML) IV SOLR
INTRAVENOUS | Status: DC | PRN
  Administered 2018-05-06: 2 g via INTRAVENOUS

## 2018-05-06 MED ORDER — FERROUS SULFATE 325 (65 FE) MG PO TABS
325.0000 mg | ORAL_TABLET | Freq: Every day | ORAL | Status: DC
  Administered 2018-05-07 – 2018-05-08 (×2): 325 mg via ORAL
  Filled 2018-05-06 (×2): qty 1

## 2018-05-06 MED ORDER — LACTATED RINGERS IV SOLN
INTRAVENOUS | Status: DC | PRN
  Administered 2018-05-06 (×2): via INTRAVENOUS

## 2018-05-06 MED ORDER — HYDROCODONE-ACETAMINOPHEN 7.5-325 MG PO TABS
1.0000 | ORAL_TABLET | ORAL | Status: DC | PRN
  Administered 2018-05-06 – 2018-05-08 (×5): 2 via ORAL
  Administered 2018-05-08: 1 via ORAL
  Filled 2018-05-06 (×6): qty 2

## 2018-05-06 MED ORDER — SODIUM CHLORIDE 0.9 % IR SOLN
Status: DC | PRN
  Administered 2018-05-06: 3000 mL

## 2018-05-06 MED ORDER — FENTANYL CITRATE (PF) 100 MCG/2ML IJ SOLN: 50.0000 ug | Freq: Once | INTRAMUSCULAR | Status: DC

## 2018-05-06 MED ORDER — ONDANSETRON HCL 4 MG/2ML IJ SOLN
INTRAMUSCULAR | Status: DC | PRN
  Administered 2018-05-06: 4 mg via INTRAVENOUS

## 2018-05-06 MED ORDER — CEFAZOLIN SODIUM 1 G IJ SOLR
INTRAMUSCULAR | Status: AC
  Filled 2018-05-06: qty 20

## 2018-05-06 MED ORDER — MIDAZOLAM HCL 2 MG/2ML IJ SOLN
INTRAMUSCULAR | Status: AC
  Filled 2018-05-06: qty 2

## 2018-05-06 MED ORDER — DIPHENHYDRAMINE HCL 12.5 MG/5ML PO ELIX: 12.5000 mg | ORAL_SOLUTION | ORAL | Status: DC | PRN

## 2018-05-06 MED ORDER — LABETALOL HCL 5 MG/ML IV SOLN
10.0000 mg | INTRAVENOUS | Status: DC | PRN
  Administered 2018-05-06: 10 mg via INTRAVENOUS

## 2018-05-06 MED ORDER — MAGNESIUM OXIDE 400 (241.3 MG) MG PO TABS
400.0000 mg | ORAL_TABLET | Freq: Every day | ORAL | Status: DC
  Administered 2018-05-06 – 2018-05-08 (×3): 400 mg via ORAL
  Filled 2018-05-06 (×5): qty 1

## 2018-05-06 MED ORDER — SODIUM CHLORIDE 0.9 % IV SOLN
INTRAVENOUS | Status: DC
  Administered 2018-05-06 – 2018-05-07 (×3): via INTRAVENOUS

## 2018-05-06 MED ORDER — DOCUSATE SODIUM 100 MG PO CAPS
100.0000 mg | ORAL_CAPSULE | Freq: Two times a day (BID) | ORAL | Status: DC
  Administered 2018-05-06 – 2018-05-08 (×4): 100 mg via ORAL
  Filled 2018-05-06 (×4): qty 1

## 2018-05-06 MED ORDER — MENTHOL 3 MG MT LOZG: 1.0000 | LOZENGE | OROMUCOSAL | Status: DC | PRN

## 2018-05-06 MED ORDER — DEXAMETHASONE SODIUM PHOSPHATE 10 MG/ML IJ SOLN
INTRAMUSCULAR | Status: DC | PRN
  Administered 2018-05-06: 10 mg via INTRAVENOUS

## 2018-05-06 MED ORDER — METHOCARBAMOL 500 MG PO TABS
ORAL_TABLET | ORAL | Status: AC
  Filled 2018-05-06: qty 1

## 2018-05-06 MED ORDER — GLYCOPYRROLATE 0.2 MG/ML IJ SOLN
INTRAMUSCULAR | Status: DC | PRN
  Administered 2018-05-06: 0.2 mg via INTRAVENOUS

## 2018-05-06 MED ORDER — FENTANYL CITRATE (PF) 100 MCG/2ML IJ SOLN
100.0000 ug | Freq: Once | INTRAMUSCULAR | Status: AC
  Administered 2018-05-06: 100 ug via INTRAVENOUS

## 2018-05-06 MED ORDER — PROPOFOL 10 MG/ML IV BOLUS
INTRAVENOUS | Status: DC | PRN
  Administered 2018-05-06 (×2): 20 mg via INTRAVENOUS
  Administered 2018-05-06: 140 mg via INTRAVENOUS
  Administered 2018-05-06: 20 mg via INTRAVENOUS

## 2018-05-06 MED ORDER — METHOCARBAMOL 500 MG PO TABS
500.0000 mg | ORAL_TABLET | Freq: Four times a day (QID) | ORAL | Status: DC | PRN
  Administered 2018-05-06 – 2018-05-08 (×3): 500 mg via ORAL
  Filled 2018-05-06 (×3): qty 1

## 2018-05-06 MED ORDER — METOCLOPRAMIDE HCL 5 MG/ML IJ SOLN: 5.0000 mg | Freq: Three times a day (TID) | INTRAMUSCULAR | Status: DC | PRN

## 2018-05-06 SURGICAL SUPPLY — 59 items
BANDAGE ACE 6X5 VEL STRL LF (GAUZE/BANDAGES/DRESSINGS) ×3 IMPLANT
BANDAGE ELASTIC 6 VELCRO ST LF (GAUZE/BANDAGES/DRESSINGS) ×3 IMPLANT
BANDAGE ESMARK 6X9 LF (GAUZE/BANDAGES/DRESSINGS) ×1 IMPLANT
BEARIN INSERT TIBIAL 3X13 (Insert) ×4 IMPLANT
BEARIN INSERT TIBIAL 3X13MM (Insert) ×2 IMPLANT
BEARING INSERT TIBIAL 3X13 (Insert) ×2 IMPLANT
BNDG COHESIVE 6X5 TAN STRL LF (GAUZE/BANDAGES/DRESSINGS) ×6 IMPLANT
BNDG ESMARK 6X9 LF (GAUZE/BANDAGES/DRESSINGS) ×3
COVER SURGICAL LIGHT HANDLE (MISCELLANEOUS) ×3 IMPLANT
CUFF TOURNIQUET SINGLE 34IN LL (TOURNIQUET CUFF) ×3 IMPLANT
DRAPE ORTHO SPLIT 77X108 STRL (DRAPES) ×4
DRAPE SURG ORHT 6 SPLT 77X108 (DRAPES) ×2 IMPLANT
DRAPE U-SHAPE 47X51 STRL (DRAPES) ×3 IMPLANT
DRSG PAD ABDOMINAL 8X10 ST (GAUZE/BANDAGES/DRESSINGS) ×6 IMPLANT
DURAPREP 26ML APPLICATOR (WOUND CARE) ×3 IMPLANT
ELECT REM PT RETURN 9FT ADLT (ELECTROSURGICAL) ×3
ELECTRODE REM PT RTRN 9FT ADLT (ELECTROSURGICAL) ×1 IMPLANT
EVACUATOR 1/8 PVC DRAIN (DRAIN) ×3 IMPLANT
FACESHIELD STD STERILE (MASK) ×6 IMPLANT
GAUZE SPONGE 4X4 12PLY STRL (GAUZE/BANDAGES/DRESSINGS) ×3 IMPLANT
GAUZE XEROFORM 1X8 LF (GAUZE/BANDAGES/DRESSINGS) ×3 IMPLANT
GLOVE BIO SURGEON STRL SZ8 (GLOVE) ×3 IMPLANT
GLOVE BIOGEL PI IND STRL 8 (GLOVE) ×2 IMPLANT
GLOVE BIOGEL PI INDICATOR 8 (GLOVE) ×4
GLOVE ORTHO TXT STRL SZ7.5 (GLOVE) ×3 IMPLANT
GOWN STRL REUS W/ TWL LRG LVL3 (GOWN DISPOSABLE) ×1 IMPLANT
GOWN STRL REUS W/ TWL XL LVL3 (GOWN DISPOSABLE) ×2 IMPLANT
GOWN STRL REUS W/TWL LRG LVL3 (GOWN DISPOSABLE) ×2
GOWN STRL REUS W/TWL XL LVL3 (GOWN DISPOSABLE) ×4
HANDPIECE INTERPULSE COAX TIP (DISPOSABLE) ×2
IMMOBILIZER KNEE 22 UNIV (SOFTGOODS) ×3 IMPLANT
INSERT TIB 3 13XPOST STAB BRNG (Insert) ×1 IMPLANT
INSERT TIB TRIATH PS X3 (Insert) ×2 IMPLANT
INSRT TIB 3 13XPOST STAB BRNG (Insert) ×1 IMPLANT
KIT BASIN OR (CUSTOM PROCEDURE TRAY) ×3 IMPLANT
KIT TURNOVER KIT B (KITS) ×3 IMPLANT
MANIFOLD NEPTUNE II (INSTRUMENTS) ×3 IMPLANT
NS IRRIG 1000ML POUR BTL (IV SOLUTION) ×3 IMPLANT
PACK TOTAL JOINT (CUSTOM PROCEDURE TRAY) ×3 IMPLANT
PAD ABD 8X10 STRL (GAUZE/BANDAGES/DRESSINGS) ×3 IMPLANT
PAD ARMBOARD 7.5X6 YLW CONV (MISCELLANEOUS) ×6 IMPLANT
PADDING CAST COTTON 6X4 STRL (CAST SUPPLIES) ×3 IMPLANT
SET HNDPC FAN SPRY TIP SCT (DISPOSABLE) ×1 IMPLANT
SET PAD KNEE POSITIONER (MISCELLANEOUS) ×3 IMPLANT
STAPLER VISISTAT 35W (STAPLE) ×3 IMPLANT
SUCTION FRAZIER HANDLE 10FR (MISCELLANEOUS) ×2
SUCTION TUBE FRAZIER 10FR DISP (MISCELLANEOUS) ×1 IMPLANT
SUT VIC AB 0 CT1 27 (SUTURE) ×4
SUT VIC AB 0 CT1 27XBRD ANBCTR (SUTURE) ×2 IMPLANT
SUT VIC AB 1 CT1 27 (SUTURE) ×4
SUT VIC AB 1 CT1 27XBRD ANBCTR (SUTURE) ×2 IMPLANT
SUT VIC AB 2-0 CT1 27 (SUTURE) ×4
SUT VIC AB 2-0 CT1 TAPERPNT 27 (SUTURE) ×2 IMPLANT
SWAB COLLECTION DEVICE MRSA (MISCELLANEOUS) IMPLANT
SWAB CULTURE ESWAB REG 1ML (MISCELLANEOUS) ×3 IMPLANT
TOWEL OR 17X24 6PK STRL BLUE (TOWEL DISPOSABLE) ×3 IMPLANT
TOWEL OR 17X26 10 PK STRL BLUE (TOWEL DISPOSABLE) ×3 IMPLANT
WATER STERILE IRR 1000ML POUR (IV SOLUTION) ×9 IMPLANT
WRAP KNEE MAXI GEL POST OP (GAUZE/BANDAGES/DRESSINGS) ×3 IMPLANT

## 2018-05-06 NOTE — Brief Op Note (Signed)
05/06/2018  10:27 AM  PATIENT:  Becky Cunningham  67 y.o. female  PRE-OPERATIVE DIAGNOSIS:  recurrent effusion right total knee arthroplasty  POST-OPERATIVE DIAGNOSIS:  recurrent effusion right total knee arthroplasty  PROCEDURE:  Procedure(s): Right knee polyexchange with synovectomy (Right)  SURGEON:  Surgeon(s) and Role:    Kathryne Hitch* Zailen Albarran Y, MD - Primary  PHYSICIAN ASSISTANT: Rexene EdisonGil Clark, PA-C present and assisted throughout the entire case and assistance was crucial for facilitating the case  ANESTHESIA:   regional and general  COUNTS:  YES  TOURNIQUET:   Total Tourniquet Time Documented: Thigh (Right) - 34 minutes Total: Thigh (Right) - 34 minutes   DICTATION: .Other Dictation: Dictation Number [id number]  PLAN OF CARE: Admit to inpatient   PATIENT DISPOSITION:  PACU - hemodynamically stable.   Delay start of Pharmacological VTE agent (>24hrs) due to surgical blood loss or risk of bleeding: no

## 2018-05-06 NOTE — Anesthesia Procedure Notes (Signed)
Anesthesia Regional Block: Adductor canal block   Pre-Anesthetic Checklist: ,, timeout performed, Correct Patient, Correct Site, Correct Laterality, Correct Procedure, Correct Position, site marked, Risks and benefits discussed,  Surgical consent,  Pre-op evaluation,  At surgeon's request and post-op pain management  Laterality: Right  Prep: chloraprep       Needles:  Injection technique: Single-shot  Needle Type: Stimulator Needle - 80     Needle Length: 10cm  Needle Gauge: 21     Additional Needles:   Narrative:  Start time: 05/06/2018 8:39 AM End time: 05/06/2018 8:49 AM Injection made incrementally with aspirations every 5 mL.  Performed by: Personally

## 2018-05-06 NOTE — Op Note (Signed)
NAME: Becky Cunningham, Becky M. MEDICAL RECORD [id number] ACCOUNT [id number] DATE OF BIRTH:03/10/1951 FACILITY: [facility] LOCATION: [facility] PHYSICIAN:Teague Goynes Aretha ParrotY. 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  PROCEDURE:  Right knee irrigation and debridement with partial synovectomy and upsizing of the polyethylene liner.  IMPLANTS:  Changed out an 11 mm fixed bearing size 3 polyethylene insert for a size 13 mm fixed bearing size 3 polyethylene insert.  FINDINGS:  Significant synovitis and hemarthrosis throughout the right knee with no gross evidence of metal component loosening, but obvious metalosis.    CULTURES:  Gram stain cultures pending.  SURGEON:  Vanita PandaChristopher Y. Magnus IvanBlackman, MD  ASSISTANT:  Richardean CanalGilbert Clark, PA-C.  ANESTHESIA: 1.  Right lower extremity block. 2.  General.  ESTIMATED BLOOD LOSS:  100 mL.  COMPLICATIONS:  None.  ANTIBIOTICS:  2 grams IV Ancef after cultures obtained.  INDICATIONS:  The patient is a 67 year old female well known to me.  She is very active.  In 2015 she underwent bilateral total knee arthroplasties and did well for a long period of time.  This year in January she underwent bilateral total hip  arthroplasties.  She is a very active individual and has had no problems until a few months ago, she woke up and started to get swelling in her right knee.  We have drained and fusion at least 2 different times on her right knee.  These were bloody  effusions with a low white blood cell count, no evidence of infection.  Of note, her PCR screens and MRSA screen have all been negative as well.  There has been no redness of her knee.  She has  not been sick feeling.  Plain films do not show any gross  evidence of loosening of the components.  We did perform a 3-phase bone scan that only had uptake in the metaphyseal area of the femur suggesting more of a synovitis, but no uptake around the components into themselves suggest loosening.  Due to the  recurrent effusions, we are recommending a right knee synovectomy and likely upsizing the polyethylene liner.  She is a very thin individual and that knee does have a slight ____ in terms of her drawer sign.  She understands that worst case scenario  would be that we find infection after removing all components and leave them out with an antibiotic spacer versus revising loose components of the metal components.  She does wish to proceed with a more minimal surgery at this point.  DESCRIPTION OF PROCEDURE:  After informed consent was obtained and appropriate right knee was marked.  She was brought to the operating room after a right lower extremity adductor canal block was obtained.  She was placed supine on the operating table.   General anesthesia was then obtained and a nonsterile tourniquet was placed around her upper right thigh and her right thigh, knee, leg and ankle were prepped and draped with DuraPrep and sterile drapes including a sterile stockinette.  A time-out was  called and she was identified by correct patient and correct right knee.  We then went through her previous incision, carried this down the knee joint.  We carried out a medial parapatellar arthrotomy finding a very large effusion of her knee that was  more of a bloody effusion.  We did send cultures and Gram stain off and gave her antibiotics.  We then opened the knee in its entirety and found significant synovitis throughout the knee.  We used electrocautery as well as a sharp knife and a Bovie to  perform a partial synovectomy in all 3 compartments.  We did remove her polyethylene liner.  It was a size 11 liner for a size 3  tibial tray.  We pulled on the tibial tray and on the femur and did not show any evidence of loosening grossly of these  components.  We then thoroughly irrigated the knee with 3 liters of normal saline solution using pulsatile lavage.  I was able to remove scar tissue from the back of the knee as well.  We then placed a 13 mm real fixed bearing polyethylene insert, but  could not get it to seat in the back and unfortunately had to remove that insert x2 and then finally got the third insert to completely sit well.  That one gave her stability of the knee with a negative drawer sign and no varus or valgus instability as  well.  Looking at the poly that we did take out of her previous size 11 poly, there was certainly wear on the medial post as well as different areas of the tibial tray.  Once we got the real polyethylene insert in place, we let tourniquet down and  hemostasis obtained with electrocautery.  We did place a medium Hemovac and drain into the arthrotomy and close arthrotomy with interrupted #1 Vicryl suture followed by 0 Vicryl in the deep tissue, 2-0 Vicryl subcutaneous tissue and interrupted staples  on the skin.  Xeroform well-padded sterile dressing was applied.  She was awakened, extubated, and taken to recovery room in stable condition.  All final counts were correct.  No complications noted.  Postoperatively, we will allow her to weightbear as  tolerated and get her up with therapy with hopeful discharge in the next 24 hours.  TN/NUANCE  D:05/06/2018 T:05/06/2018 JOB:[id number]

## 2018-05-06 NOTE — Anesthesia Procedure Notes (Signed)
Procedure Name: LMA Insertion Date/Time: 05/06/2018 9:26 AM Performed by: Izola Priceockfield, Emika Tiano Walton Jr., CRNA Pre-anesthesia Checklist: Patient identified, Emergency Drugs available, Suction available, Patient being monitored and Timeout performed Patient Re-evaluated:Patient Re-evaluated prior to induction Oxygen Delivery Method: Circle system utilized Preoxygenation: Pre-oxygenation with 100% oxygen Induction Type: IV induction Ventilation: Mask ventilation without difficulty LMA: LMA with gastric port inserted LMA Size: 4.0 Number of attempts: 1 Placement Confirmation: positive ETCO2 and breath sounds checked- equal and bilateral Tube secured with: Tape Dental Injury: Teeth and Oropharynx as per pre-operative assessment

## 2018-05-06 NOTE — Transfer of Care (Signed)
Immediate Anesthesia Transfer of Care Note  Patient: Becky Cunningham  Procedure(s) Performed: Right knee polyexchange with synovectomy (Right Knee)  Patient Location: PACU  Anesthesia Type:GA combined with regional for post-op pain  Level of Consciousness: awake, alert  and oriented  Airway & Oxygen Therapy: Patient Spontanous Breathing and Patient connected to face mask oxygen  Post-op Assessment: Report given to RN and Post -op Vital signs reviewed and stable  Post vital signs: Reviewed and stable  Last Vitals:  Vitals Value Taken Time  BP 140/99 05/06/2018 10:46 AM  Temp    Pulse 76 05/06/2018 10:52 AM  Resp 13 05/06/2018 10:52 AM  SpO2 100 % 05/06/2018 10:52 AM  Vitals shown include unvalidated device data.  Last Pain:  Vitals:   05/06/18 0714  TempSrc:   PainSc: 0-No pain      Patients Stated Pain Goal: 3 (05/06/18 [id number])  Complications: No apparent anesthesia complications

## 2018-05-06 NOTE — Evaluation (Signed)
Physical Therapy Evaluation Patient Details Name: Becky Cunningham MRN: [id number] DOB: 07/13/1951 Today's Date: 05/06/2018   History of Present Illness  pt is a 28 y//o female with h/o bil THA, R TKA, s/p R knee poly exchange with synovectomy due to increasing knee pain and worsening dysfunction.  Clinical Impression  Pt admitted with/for elective TKA revision.  Pt currently limited functionally due to the problems listed below.  (see problems list.)  Pt will benefit from PT to maximize function and safety to be able to get home safely with available assist.     Follow Up Recommendations Follow surgeon's recommendation for DC plan and follow-up therapies    Equipment Recommendations  None recommended by PT    Recommendations for Other Services       Precautions / Restrictions Precautions Precautions: Knee Required Braces or Orthoses: Knee Immobilizer - Right Knee Immobilizer - Right: Other (comment)(used today for safety and comfort) Restrictions Weight Bearing Restrictions: Yes RLE Weight Bearing: Weight bearing as tolerated      Mobility  Bed Mobility Overal bed mobility: Needs Assistance Bed Mobility: Supine to Sit;Sit to Supine     Supine to sit: Min assist Sit to supine: Min assist   General bed mobility comments: bridged well without assist,  minor stability at leg or trunk.  cues to keep pt directed.  Transfers Overall transfer level: Needs assistance   Transfers: Sit to/from Stand;Stand Pivot Transfers Sit to Stand: Min assist Stand pivot transfers: Min assist       General transfer comment: cues for hand placement, stability asssit  Ambulation/Gait Ambulation/Gait assistance: Min assist Gait Distance (Feet): 12 Feet Assistive device: Rolling walker (2 wheeled) Gait Pattern/deviations: Step-to pattern   Gait velocity interpretation: <1.31 ft/sec, indicative of household ambulator General Gait Details: cues for sequencing, pt putting relatively little  w/bearing on the R LE.  Stairs            Wheelchair Mobility    Modified Rankin (Stroke Patients Only)       Balance Overall balance assessment: No apparent balance deficits (not formally assessed)                                           Pertinent Vitals/Pain Pain Assessment: Faces Faces Pain Scale: Hurts little more Pain Location: R knee Pain Descriptors / Indicators: Burning;Aching Pain Intervention(s): Monitored during session;Repositioned    Home Living Family/patient expects to be discharged to:: Private residence Living Arrangements: Spouse/significant other Available Help at Discharge: Family Type of Home: House Home Access: Elevator     Home Layout: One level Home Equipment: Environmental consultant - 2 wheels;Walker - 4 wheels;Bedside commode;Cane - single point;Shower seat;Hand held shower head      Prior Function Level of Independence: Independent;Independent with assistive device(s)               Hand Dominance        Extremity/Trunk Assessment   Upper Extremity Assessment Upper Extremity Assessment: Defer to OT evaluation    Lower Extremity Assessment Lower Extremity Assessment: RLE deficits/detail RLE Deficits / Details: weak with pain, decreased AAROM at 60* flexion RLE Coordination: decreased fine motor       Communication   Communication: No difficulties  Cognition Arousal/Alertness: Lethargic Behavior During Therapy: WFL for tasks assessed/performed Overall Cognitive Status: Within Functional Limits for tasks assessed  General Comments      Exercises Other Exercises Other Exercises: AAROM to R LE for warm up.   Assessment/Plan    PT Assessment Patient needs continued PT services  PT Problem List Decreased strength;Decreased activity tolerance;Decreased mobility;Decreased knowledge of use of DME;Pain       PT Treatment Interventions Gait training;Functional  mobility training;Therapeutic activities;Patient/family education;DME instruction    PT Goals (Current goals can be found in the Care Plan section)  Acute Rehab PT Goals Patient Stated Goal: home independent PT Goal Formulation: With patient Time For Goal Achievement: 05/13/18 Potential to Achieve Goals: Good    Frequency 7X/week   Barriers to discharge        Co-evaluation               AM-PAC PT "6 Clicks" Daily Activity  Outcome Measure Difficulty turning over in bed (including adjusting bedclothes, sheets and blankets)?: A Little Difficulty moving from lying on back to sitting on the side of the bed? : A Little Difficulty sitting down on and standing up from a chair with arms (e.g., wheelchair, bedside commode, etc,.)?: Unable Help needed moving to and from a bed to chair (including a wheelchair)?: A Little Help needed walking in hospital room?: A Little Help needed climbing 3-5 steps with a railing? : A Little 6 Click Score: 16    End of Session   Activity Tolerance: Patient tolerated treatment well Patient left: in bed;with call bell/phone within reach Nurse Communication: Mobility status PT Visit Diagnosis: Other abnormalities of gait and mobility (R26.89);Difficulty in walking, not elsewhere classified (R26.2);Pain Pain - Right/Left: Right Pain - part of body: Knee    Time: 1230-1259 PT Time Calculation (min) (ACUTE ONLY): 29 min   Charges:   PT Evaluation $PT Eval Low Complexity: 1 Low PT Treatments $Therapeutic Activity: 8-22 mins        05/06/2018  Becky Cunningham, PT [phone]  (pager)  Becky Cunningham 05/06/2018, 1:13 PM

## 2018-05-06 NOTE — Anesthesia Preprocedure Evaluation (Signed)
Anesthesia Evaluation  Patient identified by MRN, date of birth, ID band Patient awake    Reviewed: Allergy & Precautions, H&P , NPO status , Patient's Chart, lab work & pertinent test results  History of Anesthesia Complications Negative for: history of anesthetic complications  Airway Mallampati: II  TM Distance: >3 FB Neck ROM: Full    Dental no notable dental hx. (+) Dental Advisory Given   Pulmonary neg pulmonary ROS,    Pulmonary exam normal breath sounds clear to auscultation       Cardiovascular negative cardio ROS Normal cardiovascular exam Rhythm:Regular Rate:Normal     Neuro/Psych PSYCHIATRIC DISORDERS Depression negative neurological ROS     GI/Hepatic negative GI ROS, Neg liver ROS,   Endo/Other  negative endocrine ROS  Renal/GU negative Renal ROS     Musculoskeletal  (+) Arthritis , Osteoarthritis,    Abdominal   Peds  Hematology negative hematology ROS (+)   Anesthesia Other Findings bilateral hip osteoarthritis  Reproductive/Obstetrics                             Anesthesia Physical  Anesthesia Plan  ASA: II  Anesthesia Plan: General   Post-op Pain Management:  Regional for Post-op pain   Induction: Intravenous  PONV Risk Score and Plan: 2 and 3 and Ondansetron, Dexamethasone, Midazolam and Treatment may vary due to age or medical condition  Airway Management Planned: LMA and Oral ETT  Additional Equipment:   Intra-op Plan:   Post-operative Plan: Extubation in OR  Informed Consent: I have reviewed the patients History and Physical, chart, labs and discussed the procedure including the risks, benefits and alternatives for the proposed anesthesia with the patient or authorized representative who has indicated his/her understanding and acceptance.   Dental advisory given  Plan Discussed with: CRNA  Anesthesia Plan Comments:         Anesthesia Quick  Evaluation

## 2018-05-06 NOTE — H&P (Signed)
TOTAL KNEE REVISION ADMISSION H&P  Patient is being admitted for right revision total knee arthroplasty.  Subjective:  Chief Complaint:right knee pain.  HPI: Becky Cunningham, 67 y.o. female, has a history of pain and functional disability in the right knee(s) due to failed previous arthroplasty and patient has failed non-surgical conservative treatments for greater than 12 weeks to include NSAID's and/or analgesics and activity modification. The indications for the revision of the total knee arthroplasty are bearing surface wear leading to symptomatic synovitis. Onset of symptoms was abrupt starting 1 years ago with gradually worsening course since that time.  Prior procedures on the right knee(s) include arthroplasty.  Patient currently rates pain in the right knee(s) at 5 out of 10 with activity. There is recurrent effusions.  Aspirations have been negative for infection. Patient has evidence of questionable prosthetic loosening of the tibial tray.by imaging studies. This condition presents safety issues increasing the risk of falls.  There is no current active infection.  Patient Active Problem List   Diagnosis Date Noted  . Polyethylene wear of right knee prosthesis, initial encounter (HCC) 05/06/2018  . Bilateral primary osteoarthritis of hip 09/18/2017  . History of bilateral hip arthroplasty 09/18/2017  . Status post bilateral hip replacements 09/18/2017  . Unilateral primary osteoarthritis, left hip 07/07/2017  . Unilateral primary osteoarthritis, right hip 07/07/2017  . Pain in left wrist 03/30/2017  . Primary osteoarthritis of both knees 08/25/2014  . Status post bilateral knee replacements 08/25/2014   Past Medical History:  Diagnosis Date  . Arthritis   . Depression   . Effusion of knee    Recurrent- Right    Past Surgical History:  Procedure Laterality Date  . BILATERAL ANTERIOR TOTAL HIP ARTHROPLASTY Bilateral 09/18/2017   Procedure: BILATERAL ANTERIOR TOTAL HIP  ARTHROPLASTY;  Surgeon: Kathryne Hitch, MD;  Location: [facility];  Service: Orthopedics;  Laterality: Bilateral;  . DILATION AND CURETTAGE OF UTERUS    . KNEE ARTHROSCOPY  2009   left   . TMJ similar surgery     . TOTAL KNEE ARTHROPLASTY Bilateral 08/25/2014   Procedure: TOTAL KNEE BILATERAL;  Surgeon: Kathryne Hitch, MD;  Location: [facility];  Service: Orthopedics;  Laterality: Bilateral;  . WISDOM TOOTH EXTRACTION      Current Facility-Administered Medications  Medication Dose Route Frequency Provider Last Rate Last Dose  . chlorhexidine (HIBICLENS) 4 % liquid 4 application  60 mL Topical Once Richardean Canal W, PA-C      . fentaNYL (SUBLIMAZE) 100 MCG/2ML injection           . midazolam (VERSED) 2 MG/2ML injection            Allergies  Allergen Reactions  . Other Swelling and Other (See Comments)    MELONS >> Sugar enzymes--Throat swelling/scratchy. (when eats something really sweet) with melons     Social History   Tobacco Use  . Smoking status: Never Smoker  . Smokeless tobacco: Never Used  Substance Use Topics  . Alcohol use: Yes    Alcohol/week: 14.0 standard drinks    Types: 14 Glasses of wine per week    Comment: Daily    History reviewed. No pertinent family history.    Review of Systems  All other systems reviewed and are negative.    Objective:  Physical Exam  Constitutional: She is oriented to person, place, and time. She appears well-developed and well-nourished.  HENT:  Head: Normocephalic and atraumatic.  Eyes: Pupils are equal, round, and reactive to light.  EOM are normal.  Neck: Normal range of motion. Neck supple.  Cardiovascular: Normal rate and regular rhythm.  Respiratory: Effort normal and breath sounds normal.  GI: Soft. Bowel sounds are normal.  Musculoskeletal:       Right knee: She exhibits effusion.  Neurological: She is alert and oriented to person, place, and time.  Skin: Skin is warm and dry.  Psychiatric: She has a normal  mood and affect.    Vital signs in last 24 hours: Temp:  [97.7 F (36.5 C)] 97.7 F (36.5 C) (08/29 0700) Pulse Rate:  [63] 63 (08/29 0700) Resp:  [18] 18 (08/29 0700) BP: (135)/(91) 135/91 (08/29 0700) SpO2:  [100 %] 100 % (08/29 0700) Weight:  [55.3 kg] 55.3 kg (08/29 0700)  Labs:  Estimated body mass index is 19.69 kg/m as calculated from the following:   Height as of this encounter: 5\' 6"  (1.676 m).   Weight as of this encounter: 55.3 kg.  Imaging Review Plain radiographs demonstrate slight lucency at the medial tibial plateau interface with the tibial component.  Preoperative templating of the joint replacement has been completed, documented, and submitted to the Operating Room personnel in order to optimize intra-operative equipment management.   Assessment/Plan:  Recurrent effusions right knee with synovitis post right total knee replacement.  Questionable poly wear; questionable loosening of the tibial component.  Given her recurrent right knee effusions and the failure of conservative treatment, we will proceed to the OR today for a right knee synovectomy, poly liner exchange and possible revision of components as necessary.  Risks and benefits have been discussed in detail and informed consent is obtained.

## 2018-05-06 NOTE — Anesthesia Postprocedure Evaluation (Signed)
Anesthesia Post Note  Patient: Ricardo JerichoRobin M Eskridge  Procedure(s) Performed: Right knee polyexchange with synovectomy (Right Knee)     Patient location during evaluation: PACU Anesthesia Type: General Level of consciousness: sedated Pain management: pain level controlled Vital Signs Assessment: post-procedure vital signs reviewed and stable Respiratory status: spontaneous breathing and respiratory function stable Cardiovascular status: stable Postop Assessment: no apparent nausea or vomiting Anesthetic complications: no    Last Vitals:  Vitals:   05/06/18 1145 05/06/18 1211  BP: (!) 144/93 140/87  Pulse: 67 69  Resp: 10 14  Temp: 36.6 C (!) 36.4 C  SpO2: 93% 96%    Last Pain:  Vitals:   05/06/18 1211  TempSrc: Oral  PainSc:                  Lacy Taglieri DANIEL

## 2018-05-07 DIAGNOSIS — Z96651 Presence of right artificial knee joint: Secondary | ICD-10-CM

## 2018-05-07 DIAGNOSIS — T84062D Wear of articular bearing surface of internal prosthetic right knee joint, subsequent encounter: Secondary | ICD-10-CM

## 2018-05-07 LAB — CBC
HEMATOCRIT: 32.9 % — AB (ref 36.0–46.0)
HEMOGLOBIN: 10.7 g/dL — AB (ref 12.0–15.0)
MCH: 30.7 pg (ref 26.0–34.0)
MCHC: 32.5 g/dL (ref 30.0–36.0)
MCV: 94.5 fL (ref 78.0–100.0)
Platelets: 223 10*3/uL (ref 150–400)
RBC: 3.48 MIL/uL — ABNORMAL LOW (ref 3.87–5.11)
RDW: 13.6 % (ref 11.5–15.5)
WBC: 8 10*3/uL (ref 4.0–10.5)

## 2018-05-07 LAB — BASIC METABOLIC PANEL
Anion gap: 5 (ref 5–15)
BUN: 10 mg/dL (ref 8–23)
CALCIUM: 7.7 mg/dL — AB (ref 8.9–10.3)
CHLORIDE: 105 mmol/L (ref 98–111)
CO2: 28 mmol/L (ref 22–32)
CREATININE: 0.76 mg/dL (ref 0.44–1.00)
GFR calc Af Amer: >60 mL/min (ref >60)
GFR calc non Af Amer: >60 mL/min (ref >60)
Glucose, Bld: 112 mg/dL — ABNORMAL HIGH (ref 70–99)
Potassium: 3.3 mmol/L — ABNORMAL LOW (ref 3.5–5.1)
SODIUM: 138 mmol/L (ref 135–145)

## 2018-05-07 MED ORDER — HYDROCODONE-ACETAMINOPHEN 5-325 MG PO TABS: 1.0000 | ORAL_TABLET | ORAL | 0 refills | Status: DC | PRN

## 2018-05-07 MED ORDER — ASPIRIN 81 MG PO CHEW: 81.0000 mg | CHEWABLE_TABLET | Freq: Two times a day (BID) | ORAL | 0 refills | Status: DC

## 2018-05-07 NOTE — Care Management Note (Signed)
Case Management Note  Patient Details  Name: Ricardo JerichoRobin M Latif MRN: [id number] Date of Birth: 1950/09/26  Subjective/Objective:                    Action/Plan:  Discussed discharge planning with patient. She would like [facility] and the same HHPT as last time.   Patient already has 3 in 1 and walker at home.   [facility]  Expected Discharge Date:                  Expected Discharge Plan:  Home w Home Health Services  In-House Referral:     Discharge planning Services  CM Consult  Post Acute Care Choice:  Home Health, Durable Medical Equipment Choice offered to:  Patient  DME Arranged:  N/A DME Agency:  NA  HH Arranged:  PT HH Agency:  [facility] ([facility]  Status of Service:  Completed, signed off  If discussed at [employer] Meetings, dates discussed:    Additional Comments:  Kingsley PlanWile, Beulah Matusek Marie, RN 05/07/2018, 11:14 AM

## 2018-05-07 NOTE — Progress Notes (Signed)
Subjective: 1 Day Post-Op Procedure(s) (LRB): Right knee polyexchange with synovectomy (Right) Patient reports pain as mild.  Gram stain and cultures negative for infection.  Objective: Vital signs in last 24 hours: Temp:  [97.8 F (36.6 C)-99.4 F (37.4 C)] 99 F (37.2 C) (08/30 1447) Pulse Rate:  [65-70] 67 (08/30 1447) Resp:  [13-19] 16 (08/30 1447) BP: (98-118)/(56-70) 118/65 (08/30 1447) SpO2:  [95 %-98 %] 98 % (08/30 1447)  Intake/Output from previous day: 08/29 0701 - 08/30 0700 In: 1963.5 [P.O.:240; I.V.:1577; IV Piggyback:146.5] Out: 380 [Drains:180; Blood:200] Intake/Output this shift: Total I/O In: 520 [P.O.:520] Out: 650 [Urine:650]  Recent Labs    05/07/18 0526  HGB 10.7*   Recent Labs    05/07/18 0526  WBC 8.0  RBC 3.48*  HCT 32.9*  PLT 223   Recent Labs    05/07/18 0526  NA 138  K 3.3*  CL 105  CO2 28  BUN 10  CREATININE 0.76  GLUCOSE 112*  CALCIUM 7.7*   No results for input(s): LABPT, INR in the last 72 hours.  Sensation intact distally Intact pulses distally Dorsiflexion/Plantar flexion intact Incision: dressing C/D/I No cellulitis present Compartment soft   Assessment/Plan: 1 Day Post-Op Procedure(s) (LRB): Right knee polyexchange with synovectomy (Right) Plan for discharge tomorrow    Becky Cunningham 05/07/2018, 6:36 PM

## 2018-05-07 NOTE — Progress Notes (Signed)
Physical Therapy Treatment Patient Details Name: Becky Cunningham MRN: [id number] DOB: October 13, 1950 Today's Date: 05/07/2018    History of Present Illness Pt is a 67 y/o female s/p R knee poly exchange with synovectomy due to increasing knee pain and worsening dysfunction. PMH including but not limited to bilateral THA (09/18/17) and R TKA in 2015.    PT Comments    Pt making excellent progress with functional mobility. She ambulated around entire unit with RW and min guard for safety. Will continue to follow acutely. Pt would continue to benefit from skilled physical therapy services at this time while admitted and after d/c to address the below listed limitations in order to improve overall safety and independence with functional mobility.    Follow Up Recommendations  Outpatient PT     Equipment Recommendations  None recommended by PT    Recommendations for Other Services       Precautions / Restrictions Precautions Precautions: Knee Required Braces or Orthoses: Knee Immobilizer - Right Restrictions Weight Bearing Restrictions: Yes RLE Weight Bearing: Weight bearing as tolerated    Mobility  Bed Mobility Overal bed mobility: Needs Assistance Bed Mobility: Supine to Sit;Sit to Supine     Supine to sit: Supervision Sit to supine: Supervision   General bed mobility comments: supervision for safety  Transfers Overall transfer level: Needs assistance Equipment used: Rolling walker (2 wheeled) Transfers: Sit to/from Stand Sit to Stand: Min guard         General transfer comment: good technique, min guard for safety  Ambulation/Gait Ambulation/Gait assistance: Min guard Gait Distance (Feet): 500 Feet Assistive device: Rolling walker (2 wheeled) Gait Pattern/deviations: Step-through pattern;Decreased stride length Gait velocity: decreased Gait velocity interpretation: 1.31 - 2.62 ft/sec, indicative of limited community ambulator General Gait Details: pt steady with  RW, progressing to a step-through pattern, mild instability with RW but no overt LOB or need for physical assistance, min guard for safety   Stairs             Wheelchair Mobility    Modified Rankin (Stroke Patients Only)       Balance Overall balance assessment: Needs assistance Sitting-balance support: Feet supported Sitting balance-Leahy Scale: Good     Standing balance support: During functional activity;Bilateral upper extremity supported;Single extremity supported Standing balance-Leahy Scale: Poor                              Cognition Arousal/Alertness: Awake/alert Behavior During Therapy: WFL for tasks assessed/performed Overall Cognitive Status: Within Functional Limits for tasks assessed                                        Exercises      General Comments        Pertinent Vitals/Pain Pain Assessment: Faces Faces Pain Scale: Hurts little more Pain Location: R knee Pain Descriptors / Indicators: Burning;Aching Pain Intervention(s): Monitored during session;Repositioned    Home Living                      Prior Function            PT Goals (current goals can now be found in the care plan section) Acute Rehab PT Goals PT Goal Formulation: With patient Time For Goal Achievement: 05/13/18 Potential to Achieve Goals: Good Progress towards PT goals: Progressing toward goals  Frequency    7X/week      PT Plan Current plan remains appropriate    Co-evaluation              AM-PAC PT "6 Clicks" Daily Activity  Outcome Measure  Difficulty turning over in bed (including adjusting bedclothes, sheets and blankets)?: None Difficulty moving from lying on back to sitting on the side of the bed? : None Difficulty sitting down on and standing up from a chair with arms (e.g., wheelchair, bedside commode, etc,.)?: Unable Help needed moving to and from a bed to chair (including a wheelchair)?: None Help  needed walking in hospital room?: A Little Help needed climbing 3-5 steps with a railing? : A Little 6 Click Score: 19    End of Session   Activity Tolerance: Patient tolerated treatment well Patient left: in bed;with call bell/phone within reach Nurse Communication: Mobility status PT Visit Diagnosis: Other abnormalities of gait and mobility (R26.89);Difficulty in walking, not elsewhere classified (R26.2);Pain Pain - Right/Left: Right Pain - part of body: Knee     Time: 1500-1520 PT Time Calculation (min) (ACUTE ONLY): 20 min  Charges:  $Gait Training: 8-22 mins                     Deborah ChalkJennifer Arneisha Kincannon, [place], DPT  Acute Rehabilitation Services Pager [phone] Office [phone]     Alessandra BevelsJennifer M Shemuel Harkleroad 05/07/2018, 4:14 PM

## 2018-05-08 NOTE — Progress Notes (Signed)
     Subjective: 2 Days Post-Op Procedure(s) (LRB): Right knee polyexchange with synovectomy (Right)  Patient reports pain as moderate.    Objective:   VITALS:  Temp:  [98.7 F (37.1 C)-99 F (37.2 C)] 98.8 F (37.1 C) (08/31 0557) Pulse Rate:  [66-67] 66 (08/31 0557) Resp:  [13-17] 17 (08/31 0557) BP: (107-143)/(65-80) 143/80 (08/31 0557) SpO2:  [95 %-98 %] 97 % (08/31 0557) Awake, alert and oriented x 4. I'm ready to go. HHN for PT at home.  Neurologically intact ABD soft Neurovascular intact Sensation intact distally Intact pulses distally Dorsiflexion/Plantar flexion intact Incision: scant drainage Compartment soft   LABS Recent Labs    05/07/18 0526  HGB 10.7*  WBC 8.0  PLT 223   Recent Labs    05/07/18 0526  NA 138  K 3.3*  CL 105  CO2 28  BUN 10  CREATININE 0.76  GLUCOSE 112*   No results for input(s): LABPT, INR in the last 72 hours.   Assessment/Plan: 2 Days Post-Op Procedure(s) (LRB): Right knee polyexchange with synovectomy (Right)  Advance diet Up with therapy Discharge home with home health  Becky Cunningham 05/08/2018, 10:35 AMPatient ID: Becky Cunningham, female   DOB: 1951/04/12, 67 y.o.   MRN: [id number]

## 2018-05-08 NOTE — Discharge Instructions (Signed)

## 2018-05-11 ENCOUNTER — Encounter (HOSPITAL_COMMUNITY): Payer: Self-pay | Admitting: Orthopaedic Surgery

## 2018-05-11 ENCOUNTER — Inpatient Hospital Stay (INDEPENDENT_AMBULATORY_CARE_PROVIDER_SITE_OTHER): Payer: Medicare Other | Admitting: Orthopaedic Surgery

## 2018-05-11 LAB — AEROBIC/ANAEROBIC CULTURE W GRAM STAIN (SURGICAL/DEEP WOUND): Culture: NO GROWTH

## 2018-05-13 NOTE — Discharge Summary (Signed)
Patient ID: Becky Cunningham MRN: [id number] DOB/AGE: 03/02/1951 67 y.o.  Admit date: 05/06/2018 Discharge date: 05/13/2018  Admission Diagnoses:  Principal Problem:   Polyethylene wear of right knee prosthesis, initial encounter [facility]) Active Problems:   Status post revision of total knee, right   Discharge Diagnoses:  Same  Past Medical History:  Diagnosis Date  . Arthritis    "qwhere; dx'd in 1980" (05/06/2018)  . Depression   . Effusion of knee    Recurrent- Right  . History of blood transfusion 09/2017   "related to hip OR"    Surgeries: Procedure(s): Right knee polyexchange with synovectomy on 05/06/2018   Consultants:   Discharged Condition: Improved  Hospital Course: Becky Cunningham is an 67 y.o. female who was admitted 05/06/2018 for operative treatment ofPolyethylene wear of right knee prosthesis, initial encounter (HCC). Patient has severe unremitting pain that affects sleep, daily activities, and work/hobbies. After pre-op clearance the patient was taken to the operating room on 05/06/2018 and underwent  Procedure(s): Right knee polyexchange with synovectomy.    Patient was given perioperative antibiotics:  Anti-infectives (From admission, onward)   Start     Dose/Rate Route Frequency Ordered Stop   05/06/18 1215  ceFAZolin (ANCEF) IVPB 1 g/50 mL premix     1 g 100 mL/hr over 30 Minutes Intravenous Every 6 hours 05/06/18 1212 05/06/18 2008       Patient was given sequential compression devices, early ambulation, and chemoprophylaxis to prevent DVT.  Patient benefited maximally from hospital stay and there were no complications.    Recent vital signs: No data found.   Recent laboratory studies: No results for input(s): WBC, HGB, HCT, PLT, NA, K, CL, CO2, BUN, CREATININE, GLUCOSE, INR, CALCIUM in the last 72 hours.  Invalid input(s): PT, 2   Discharge Medications:   Allergies as of 05/08/2018      Reactions   Other Swelling, Other (See Comments)   MELONS  >> Sugar enzymes--Throat swelling/scratchy. (when eats something really sweet) with melons      Medication List    TAKE these medications   aspirin 81 MG chewable tablet Chew 1 tablet (81 mg total) by mouth 2 (two) times daily.   b complex vitamins tablet Take 1 tablet by mouth every other day.   CALCIUM 600 + D PO Take 1 tablet by mouth 2 (two) times daily.   DIGESTIVE ENZYMES PO Take 1 capsule by mouth daily. Probiotic Digestive Enzyme.   etodolac 500 MG tablet Commonly known as:  LODINE TK 1 T PO BID WF   ferrous sulfate 325 (65 FE) MG tablet Take 1 tablet (325 mg total) by mouth 3 (three) times daily with meals. What changed:  when to take this   FLUoxetine 20 MG capsule Commonly known as:  PROZAC Take 60 mg by mouth daily.   HYDROcodone-acetaminophen 5-325 MG tablet Commonly known as:  NORCO/VICODIN Take 1-2 tablets by mouth every 4 (four) hours as needed for moderate pain (pain score 4-6).   Magnesium 500 MG Tabs Take 500 mg by mouth daily.   methocarbamol 500 MG tablet Commonly known as:  ROBAXIN Take 1 tablet (500 mg total) by mouth every 6 (six) hours as needed for muscle spasms.   methylphenidate 20 MG tablet Commonly known as:  RITALIN TK 1 T PO BID OES PRN   PROBIOTIC PO Take 1 scoop by mouth daily. Probiotic Shake--"Natural Vitality"   vitamin C 500 MG tablet Commonly known as:  ASCORBIC ACID Take 500 mg by  mouth 3 (three) times daily.       Diagnostic Studies: Dg Knee Right Port  Result Date: 05/06/2018 CLINICAL DATA:  Status post total knee revision. EXAM: PORTABLE RIGHT KNEE - 1-2 VIEW COMPARISON:  RIGHT knee radiograph August 25, 2014 FINDINGS: Status post total knee arthroplasty with intact well seated femoral and tibial non cemented components, expected appearance of the resurfaced patella. No acute fracture deformity. No dislocation. No destructive bony lesions. Subcutaneous emphysema consistent with recent surgery with overlying skin  staples. Surgical drain in place. IMPRESSION: Status post total knee arthroplasty with expected postoperative appearance; surgical drain in place. Electronically Signed   By: Awilda Metro M.D.   On: 05/06/2018 13:49    Disposition:   Discharge Instructions    Call MD / Call 911   Complete by:  As directed    If you experience chest pain or shortness of breath, CALL 911 and be transported to the hospital emergency room.  If you develope a fever above 101 F, pus (white drainage) or increased drainage or redness at the wound, or calf pain, call your surgeon's office.   Constipation Prevention   Complete by:  As directed    Drink plenty of fluids.  Prune juice may be helpful.  You may use a stool softener, such as Colace (over the counter) 100 mg twice a day.  Use MiraLax (over the counter) for constipation as needed.   Diet - low sodium heart healthy   Complete by:  As directed    Discharge instructions   Complete by:  As directed    INSTRUCTIONS AFTER JOINT REPLACEMENT   Remove items at home which could result in a fall. This includes throw rugs or furniture in walking pathways ICE to the affected joint every three hours while awake for 30 minutes at a time, for at least the first 3-5 days, and then as needed for pain and swelling.  Continue to use ice for pain and swelling. You may notice swelling that will progress down to the foot and ankle.  This is normal after surgery.  Elevate your leg when you are not up walking on it.   Continue to use the breathing machine you got in the hospital (incentive spirometer) which will help keep your temperature down.  It is common for your temperature to cycle up and down following surgery, especially at night when you are not up moving around and exerting yourself.  The breathing machine keeps your lungs expanded and your temperature down.   DIET:  As you were doing prior to hospitalization, we recommend a well-balanced diet.  DRESSING / WOUND CARE  / SHOWERING  Keep the surgical dressing until follow up.  The dressing is water proof, so you can shower without any extra covering.  IF THE DRESSING FALLS OFF or the wound gets wet inside, change the dressing with sterile gauze.  Please use good hand washing techniques before changing the dressing.  Do not use any lotions or creams on the incision until instructed by your surgeon.    ACTIVITY  Increase activity slowly as tolerated, but follow the weight bearing instructions below.   No driving for 6 weeks or until further direction given by your physician.  You cannot drive while taking narcotics.  No lifting or carrying greater than 10 lbs. until further directed by your surgeon. Avoid periods of inactivity such as sitting longer than an hour when not asleep. This helps prevent blood clots.  You may return to work  once you are authorized by your doctor.     WEIGHT BEARING   Weight bearing as tolerated with assist device (walker, cane, etc) as directed, use it as long as suggested by your surgeon or therapist, typically at least 4-6 weeks.   EXERCISES  Results after joint replacement surgery are often greatly improved when you follow the exercise, range of motion and muscle strengthening exercises prescribed by your doctor. Safety measures are also important to protect the joint from further injury. Any time any of these exercises cause you to have increased pain or swelling, decrease what you are doing until you are comfortable again and then slowly increase them. If you have problems or questions, call your caregiver or physical therapist for advice.   Rehabilitation is important following a joint replacement. After just a few days of immobilization, the muscles of the leg can become weakened and shrink (atrophy).  These exercises are designed to build up the tone and strength of the thigh and leg muscles and to improve motion. Often times heat used for twenty to thirty minutes before  working out will loosen up your tissues and help with improving the range of motion but do not use heat for the first two weeks following surgery (sometimes heat can increase post-operative swelling).   These exercises can be done on a training (exercise) mat, on the floor, on a table or on a bed. Use whatever works the best and is most comfortable for you.    Use music or television while you are exercising so that the exercises are a pleasant break in your day. This will make your life better with the exercises acting as a break in your routine that you can look forward to.   Perform all exercises about fifteen times, three times per day or as directed.  You should exercise both the operative leg and the other leg as well.  Exercises include:   Quad Sets - Tighten up the muscle on the front of the thigh (Quad) and hold for 5-10 seconds.   Straight Leg Raises - With your knee straight (if you were given a brace, keep it on), lift the leg to 60 degrees, hold for 3 seconds, and slowly lower the leg.  Perform this exercise against resistance later as your leg gets stronger.  Leg Slides: Lying on your back, slowly slide your foot toward your buttocks, bending your knee up off the floor (only go as far as is comfortable). Then slowly slide your foot back down until your leg is flat on the floor again.  Angel Wings: Lying on your back spread your legs to the side as far apart as you can without causing discomfort.  Hamstring Strength:  Lying on your back, push your heel against the floor with your leg straight by tightening up the muscles of your buttocks.  Repeat, but this time bend your knee to a comfortable angle, and push your heel against the floor.  You may put a pillow under the heel to make it more comfortable if necessary.   A rehabilitation program following joint replacement surgery can speed recovery and prevent re-injury in the future due to weakened muscles. Contact your doctor or a physical  therapist for more information on knee rehabilitation.    CONSTIPATION  Constipation is defined medically as fewer than three stools per week and severe constipation as less than one stool per week.  Even if you have a regular bowel pattern at home, your normal regimen is  likely to be disrupted due to multiple reasons following surgery.  Combination of anesthesia, postoperative narcotics, change in appetite and fluid intake all can affect your bowels.   YOU MUST use at least one of the following options; they are listed in order of increasing strength to get the job done.  They are all available over the counter, and you may need to use some, POSSIBLY even all of these options:    Drink plenty of fluids (prune juice may be helpful) and high fiber foods Colace 100 mg by mouth twice a day  Senokot for constipation as directed and as needed Dulcolax (bisacodyl), take with full glass of water  Miralax (polyethylene glycol) once or twice a day as needed.  If you have tried all these things and are unable to have a bowel movement in the first 3-4 days after surgery call either your surgeon or your primary doctor.    If you experience loose stools or diarrhea, hold the medications until you stool forms back up.  If your symptoms do not get better within 1 week or if they get worse, check with your doctor.  If you experience "the worst abdominal pain ever" or develop nausea or vomiting, please contact the office immediately for further recommendations for treatment.   ITCHING:  If you experience itching with your medications, try taking only a single pain pill, or even half a pain pill at a time.  You can also use Benadryl over the counter for itching or also to help with sleep.   TED HOSE STOCKINGS:  Use stockings on both legs until for at least 2 weeks or as directed by physician office. They may be removed at night for sleeping.  MEDICATIONS:  See your medication summary on the "After Visit  Summary" that nursing will review with you.  You may have some home medications which will be placed on hold until you complete the course of blood thinner medication.  It is important for you to complete the blood thinner medication as prescribed.  PRECAUTIONS:  If you experience chest pain or shortness of breath - call 911 immediately for transfer to the hospital emergency department.   If you develop a fever greater that 101 F, purulent drainage from wound, increased redness or drainage from wound, foul odor from the wound/dressing, or calf pain - CONTACT YOUR SURGEON.                                                   FOLLOW-UP APPOINTMENTS:  If you do not already have a post-op appointment, please call the office for an appointment to be seen by your surgeon.  Guidelines for how soon to be seen are listed in your "After Visit Summary", but are typically between 1-4 weeks after surgery.  OTHER INSTRUCTIONS:   Knee Replacement:  Do not place pillow under knee, focus on keeping the knee straight while resting. CPM instructions: 0-90 degrees, 2 hours in the morning, 2 hours in the afternoon, and 2 hours in the evening. Place foam block, curve side up under heel at all times except when in CPM or when walking.  DO NOT modify, tear, cut, or change the foam block in any way.  MAKE SURE YOU:  Understand these instructions.  Get help right away if you are not doing well or get worse.  Thank you for letting us be a part of your medical care team.  It is a privilege we respect greatly.  We hope these instructions will help you stay on track for a fast and full recovery!   Driving restrictions   Complete by:  As directed    No driving for 6 weeks   Increase activity slowly as tolerated   Complete by:  As directed    Lifting restrictions   Complete by:  As directed    No lifting for 6 weeks      Follow-up Information    Kathryne Hitch, MD. Schedule an appointment as soon as possible  for a visit in 2 week(s).   Specialty:  Orthopedic Surgery Contact information: [address] [place] [address] [phone]            Signed: Richardean Canal 05/13/2018, 1:11 PM

## 2018-05-14 DIAGNOSIS — F909 Attention-deficit hyperactivity disorder, unspecified type: Secondary | ICD-10-CM | POA: Diagnosis not present

## 2018-05-14 DIAGNOSIS — Z9181 History of falling: Secondary | ICD-10-CM | POA: Diagnosis not present

## 2018-05-14 DIAGNOSIS — T84062D Wear of articular bearing surface of internal prosthetic right knee joint, subsequent encounter: Secondary | ICD-10-CM | POA: Diagnosis not present

## 2018-05-14 DIAGNOSIS — F329 Major depressive disorder, single episode, unspecified: Secondary | ICD-10-CM | POA: Diagnosis not present

## 2018-05-19 DIAGNOSIS — Z9181 History of falling: Secondary | ICD-10-CM | POA: Diagnosis not present

## 2018-05-19 DIAGNOSIS — T84062D Wear of articular bearing surface of internal prosthetic right knee joint, subsequent encounter: Secondary | ICD-10-CM | POA: Diagnosis not present

## 2018-05-19 DIAGNOSIS — F329 Major depressive disorder, single episode, unspecified: Secondary | ICD-10-CM | POA: Diagnosis not present

## 2018-05-19 DIAGNOSIS — F909 Attention-deficit hyperactivity disorder, unspecified type: Secondary | ICD-10-CM | POA: Diagnosis not present

## 2018-05-20 ENCOUNTER — Ambulatory Visit (INDEPENDENT_AMBULATORY_CARE_PROVIDER_SITE_OTHER): Payer: Medicare Other | Admitting: Orthopaedic Surgery

## 2018-05-20 ENCOUNTER — Encounter (INDEPENDENT_AMBULATORY_CARE_PROVIDER_SITE_OTHER): Payer: Self-pay | Admitting: Orthopaedic Surgery

## 2018-05-20 DIAGNOSIS — Z96651 Presence of right artificial knee joint: Secondary | ICD-10-CM

## 2018-05-20 NOTE — Progress Notes (Signed)
The patient is following up after having a synovectomy of a right total knee arthroplasty and upsizing her polyliner.  She is doing great she states.  She is not using assistive device.  She has 3 more home health visits and does not need outpatient therapy.  Exam her knee is mildly swollen and not to be expected after surgery.  Staples been removed and Steri-Strips applied.  Her range of motion is almost entirely full.  The knee feels loosely stable.  We talked about her surgery about the synovectomy we did upsizing her poly-.  Hopefully this will do well for her.  That never showed signs of infection at all.  The components themselves do not appear loose.  Hopefully this will resolve her symptoms but if not we may have to consider revision but hopefully that will be the case.  We will see her back in 4 weeks to see how she is doing overall.

## 2018-05-21 DIAGNOSIS — F329 Major depressive disorder, single episode, unspecified: Secondary | ICD-10-CM | POA: Diagnosis not present

## 2018-05-21 DIAGNOSIS — T84062D Wear of articular bearing surface of internal prosthetic right knee joint, subsequent encounter: Secondary | ICD-10-CM | POA: Diagnosis not present

## 2018-05-21 DIAGNOSIS — Z9181 History of falling: Secondary | ICD-10-CM | POA: Diagnosis not present

## 2018-05-21 DIAGNOSIS — F909 Attention-deficit hyperactivity disorder, unspecified type: Secondary | ICD-10-CM | POA: Diagnosis not present

## 2018-05-25 DIAGNOSIS — F331 Major depressive disorder, recurrent, moderate: Secondary | ICD-10-CM | POA: Diagnosis not present

## 2018-05-25 DIAGNOSIS — Z9181 History of falling: Secondary | ICD-10-CM | POA: Diagnosis not present

## 2018-05-25 DIAGNOSIS — T84062D Wear of articular bearing surface of internal prosthetic right knee joint, subsequent encounter: Secondary | ICD-10-CM | POA: Diagnosis not present

## 2018-05-25 DIAGNOSIS — F329 Major depressive disorder, single episode, unspecified: Secondary | ICD-10-CM | POA: Diagnosis not present

## 2018-05-25 DIAGNOSIS — Z23 Encounter for immunization: Secondary | ICD-10-CM | POA: Diagnosis not present

## 2018-05-25 DIAGNOSIS — F909 Attention-deficit hyperactivity disorder, unspecified type: Secondary | ICD-10-CM | POA: Diagnosis not present

## 2018-05-27 DIAGNOSIS — F329 Major depressive disorder, single episode, unspecified: Secondary | ICD-10-CM | POA: Diagnosis not present

## 2018-05-27 DIAGNOSIS — T84062D Wear of articular bearing surface of internal prosthetic right knee joint, subsequent encounter: Secondary | ICD-10-CM | POA: Diagnosis not present

## 2018-05-27 DIAGNOSIS — F909 Attention-deficit hyperactivity disorder, unspecified type: Secondary | ICD-10-CM | POA: Diagnosis not present

## 2018-05-27 DIAGNOSIS — Z9181 History of falling: Secondary | ICD-10-CM | POA: Diagnosis not present

## 2018-06-21 ENCOUNTER — Encounter (INDEPENDENT_AMBULATORY_CARE_PROVIDER_SITE_OTHER): Payer: Self-pay | Admitting: Orthopaedic Surgery

## 2018-06-21 ENCOUNTER — Ambulatory Visit (INDEPENDENT_AMBULATORY_CARE_PROVIDER_SITE_OTHER): Payer: Medicare Other

## 2018-06-21 ENCOUNTER — Ambulatory Visit (INDEPENDENT_AMBULATORY_CARE_PROVIDER_SITE_OTHER): Payer: Medicare Other | Admitting: Orthopaedic Surgery

## 2018-06-21 DIAGNOSIS — M79672 Pain in left foot: Secondary | ICD-10-CM | POA: Diagnosis not present

## 2018-06-21 DIAGNOSIS — Z96651 Presence of right artificial knee joint: Secondary | ICD-10-CM

## 2018-06-21 NOTE — Progress Notes (Signed)
Patient is well-known to [place].  She is 6 weeks status post a revision of a right total knee arthroplasty.  This is basically upsizing her polyethylene liner.  She is done very well since then and denies any knee swelling.  She is reporting midfoot left foot pain is been going on for over a year now.  She tried a cam walking boot that she had at home she used when she had a stress fracture before but this did not help.  She denies any specific injuries.  She is a patient with a history of bilateral total hip arthroplasties and bilateral knee replacement.  She is very thin and active individual.  On examination of her right operative knee there is no significant effusion today at all compared to what she had before and her range of motion is full.  The knee feels loosely stable.  Examination of her left foot does show some midfoot pain.  X-rays of the left foot show midfoot arthritic changes with dorsal osteophytes.  The x-rays are otherwise unremarkable.  As far as her knee goes, we do not need to see her back to 3 months.  Were also seeing her for her hips at that visit.  I would like just a low AP pelvis and an AP and lateral of her right knee at that visit.  If her foot pain worsens in any way we can always send her to [facility] for custom orthotics if needed.

## 2018-08-13 DIAGNOSIS — F900 Attention-deficit hyperactivity disorder, predominantly inattentive type: Secondary | ICD-10-CM | POA: Diagnosis not present

## 2018-08-13 DIAGNOSIS — R739 Hyperglycemia, unspecified: Secondary | ICD-10-CM | POA: Diagnosis not present

## 2018-08-13 DIAGNOSIS — E785 Hyperlipidemia, unspecified: Secondary | ICD-10-CM | POA: Diagnosis not present

## 2018-08-13 DIAGNOSIS — Z5181 Encounter for therapeutic drug level monitoring: Secondary | ICD-10-CM | POA: Diagnosis not present

## 2018-08-13 DIAGNOSIS — R030 Elevated blood-pressure reading, without diagnosis of hypertension: Secondary | ICD-10-CM | POA: Diagnosis not present

## 2018-08-13 DIAGNOSIS — F331 Major depressive disorder, recurrent, moderate: Secondary | ICD-10-CM | POA: Diagnosis not present

## 2018-09-14 ENCOUNTER — Ambulatory Visit (INDEPENDENT_AMBULATORY_CARE_PROVIDER_SITE_OTHER): Payer: Medicare Other

## 2018-09-14 ENCOUNTER — Ambulatory Visit (INDEPENDENT_AMBULATORY_CARE_PROVIDER_SITE_OTHER): Payer: Medicare Other | Admitting: Orthopaedic Surgery

## 2018-09-14 ENCOUNTER — Encounter (INDEPENDENT_AMBULATORY_CARE_PROVIDER_SITE_OTHER): Payer: Self-pay | Admitting: Orthopaedic Surgery

## 2018-09-14 DIAGNOSIS — Z96651 Presence of right artificial knee joint: Secondary | ICD-10-CM

## 2018-09-14 DIAGNOSIS — Z96643 Presence of artificial hip joint, bilateral: Secondary | ICD-10-CM | POA: Diagnosis not present

## 2018-09-14 NOTE — Progress Notes (Signed)
Office Visit Note   Patient: Becky Cunningham           Date of Birth: Jun 14, 1951           MRN: [id number] Visit Date: 09/14/2018              Requested by: Shirlean Mylar, MD [address] [place], [place] [address] PCP: Shirlean Mylar, MD   Assessment & Plan: Visit Diagnoses:  1. History of total right knee replacement   2. H/O bilateral hip replacements     Plan: From a hip and knee standpoint for now follow-up as needed.  If she develops any issues with either knee or either hip she knows to come see [place].  As far as her left foot goes I recommended her to go to the running store [employer] to try their inserts for better arch support.  All questions concerns were answered and addressed.  Follow-up can be as needed.  Follow-Up Instructions: Return if symptoms worsen or fail to improve.   Orders:  Orders Placed This Encounter  Procedures  . XR Knee 1-2 Views Right  . XR Pelvis 1-2 Views   No orders of the defined types were placed in this encounter.     Procedures: No procedures performed   Clinical Data: No additional findings.   Subjective: Chief Complaint  Patient presents with  . Right Knee - Follow-up  . Left Knee - Follow-up  The patient is well-known to me.  She has a history of bilateral total hip arthroplasties and bilateral total knee arthroplasties.  4 to 5 months ago we actually took her to the operating room for her right knee.  She had recurrent effusions of that knee without synovitis and we upsize her poly-.  She is had no issues with the knee since then.  She denies any hip pain or knee pain.  She has been reporting left foot pain.  She does not wear tennis shoes and has used this an over-the-counter insert and her left foot.  She reports midfoot pain that radiates around her feet on occasion.  She is not a diabetic and denies any numbness and tingling.  HPI  Review of Systems She currently denies any headache, chest pain, shortness of  breath, fever, chills, nausea, vomiting.  Objective: Vital Signs: There were no vitals taken for this visit.  Physical Exam She is alert and orient x3 and in no acute distress Ortho Exam Examination of both hips show fluid and full range of motion with no pain in both hips.  Examination of both knees show no effusion.  Both knees have full and fluid range of motion in both knees are ligamentously stable.  The right more recent operative knee is still warm but no redness.  Examination of her left foot shows a normal arch.  She does have some mid foot pain on stress but otherwise normal foot and ankle exam. Specialty Comments:  No specialty comments available.  Imaging: Xr Knee 1-2 Views Right  Result Date: 09/14/2018 An AP and lateral of the right knee shows a total knee arthroplasty with no effusion.  There is slight lucency at the medial tibial tray on the AP view.  There is no other evidence of loosening on AP or lateral views of the tibial or femoral component.  Xr Pelvis 1-2 Views  Result Date: 09/14/2018 An AP pelvis shows bilateral total hip arthroplasties with no complicating features.  There is no evidence of loosening.  PMFS History: Patient Active Problem List   Diagnosis Date Noted  . Polyethylene wear of right knee prosthesis, initial encounter (HCC) 05/06/2018  . Status post revision of total knee, right 05/06/2018  . Bilateral primary osteoarthritis of hip 09/18/2017  . History of bilateral hip arthroplasty 09/18/2017  . Status post bilateral hip replacements 09/18/2017  . Unilateral primary osteoarthritis, left hip 07/07/2017  . Unilateral primary osteoarthritis, right hip 07/07/2017  . Pain in left wrist 03/30/2017  . Primary osteoarthritis of both knees 08/25/2014  . Status post bilateral knee replacements 08/25/2014   Past Medical History:  Diagnosis Date  . Arthritis    "qwhere; dx'd in 1980" (05/06/2018)  . Depression   . Effusion of knee    Recurrent-  Right  . History of blood transfusion 09/2017   "related to hip OR"    No family history on file.  Past Surgical History:  Procedure Laterality Date  . BILATERAL ANTERIOR TOTAL HIP ARTHROPLASTY Bilateral 09/18/2017   Procedure: BILATERAL ANTERIOR TOTAL HIP ARTHROPLASTY;  Surgeon: Kathryne Hitch, MD;  Location: [facility];  Service: Orthopedics;  Laterality: Bilateral;  . DILATION AND CURETTAGE OF UTERUS    . JOINT REPLACEMENT    . KNEE ARTHROSCOPY Left 2009  . SYNOVECTOMY WITH POLY EXCHANGE Right 05/06/2018   knee  . TMJ similar surgery     . TOTAL KNEE ARTHROPLASTY Bilateral 08/25/2014   Procedure: TOTAL KNEE BILATERAL;  Surgeon: Kathryne Hitch, MD;  Location: [facility];  Service: Orthopedics;  Laterality: Bilateral;  . TOTAL KNEE REVISION Right 05/06/2018   Procedure: Right knee polyexchange with synovectomy;  Surgeon: Kathryne Hitch, MD;  Location: [facility];  Service: Orthopedics;  Laterality: Right;  . WISDOM TOOTH EXTRACTION     Social History   Occupational History  . Not on file  Tobacco Use  . Smoking status: Never Smoker  . Smokeless tobacco: Never Used  Substance and Sexual Activity  . Alcohol use: Yes    Alcohol/week: 12.0 standard drinks    Types: 12 Glasses of wine per week    Comment: 05/06/2018 ""2 bottles/week; at least"  . Drug use: Yes    Types: Marijuana    Comment: 05/06/2018 "weekends"  . Sexual activity: Yes

## 2018-10-16 IMAGING — RF DG HIP (WITH PELVIS) OPERATIVE*R*
1 series · 6 of 6 positions shown · non-contrast
Comparison: No recent prior.

CLINICAL DATA: Right hip replacement.

EXAM:
OPERATIVE RIGHT HIP (WITH PELVIS IF PERFORMED) 4 VIEWS
TECHNIQUE: Fluoroscopic spot image(s) were submitted for interpretation
post-operatively.

[Series 1: run · 6 of 6 slices shown]
[im 1/6]
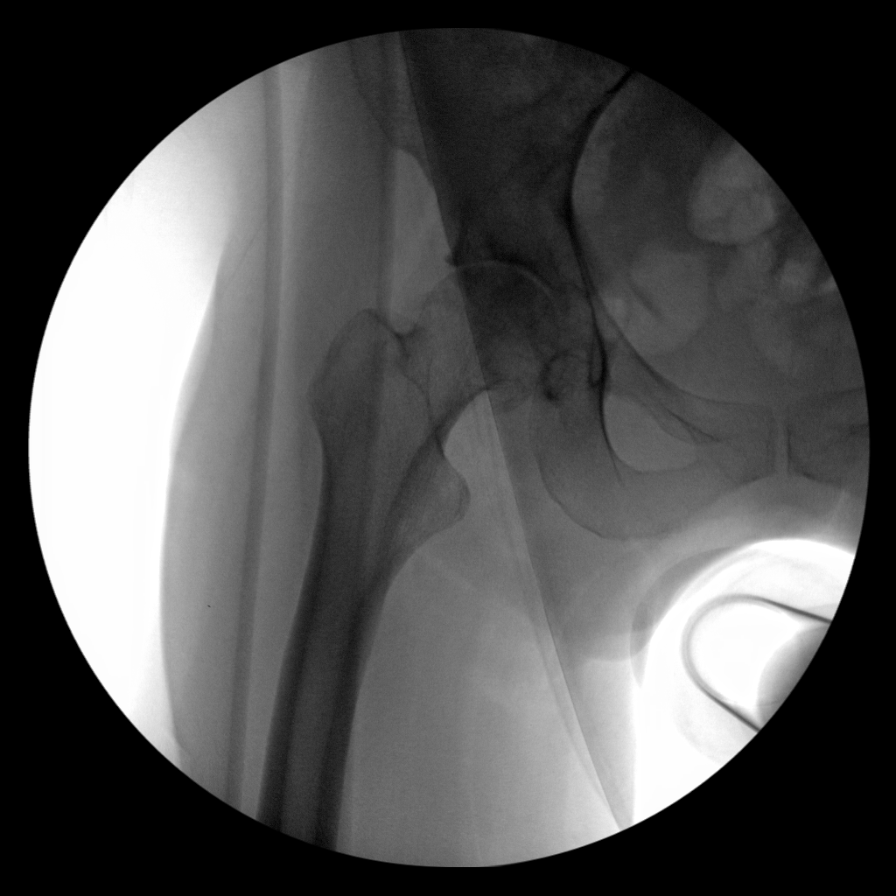
[im 2/6]
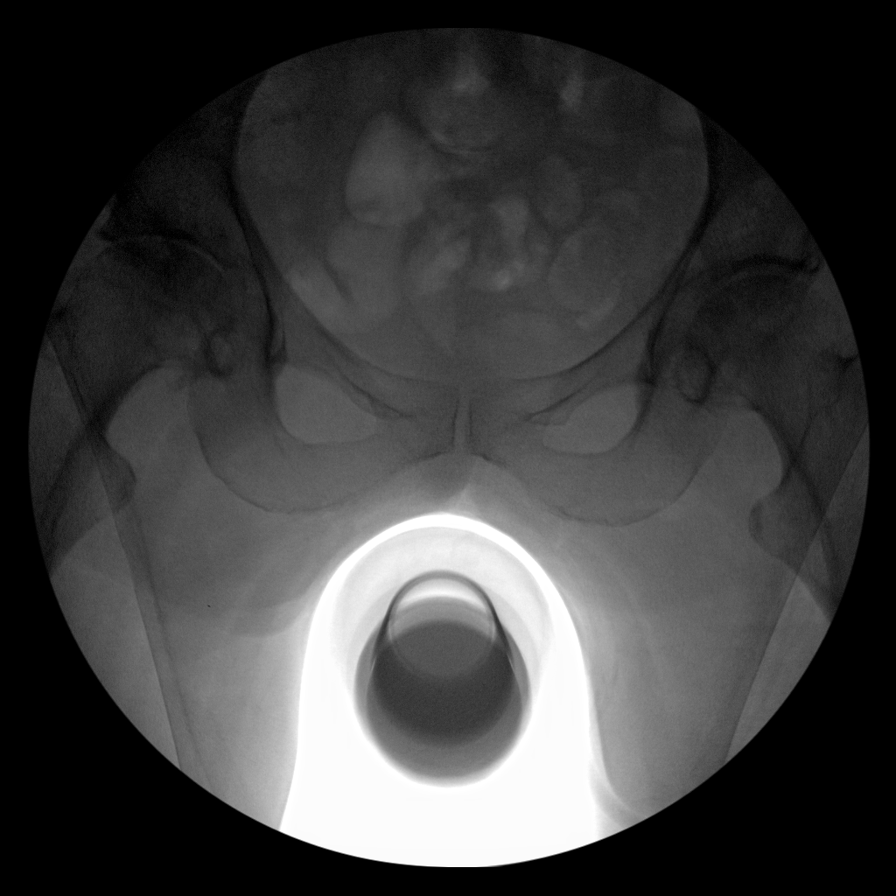
[im 3/6]
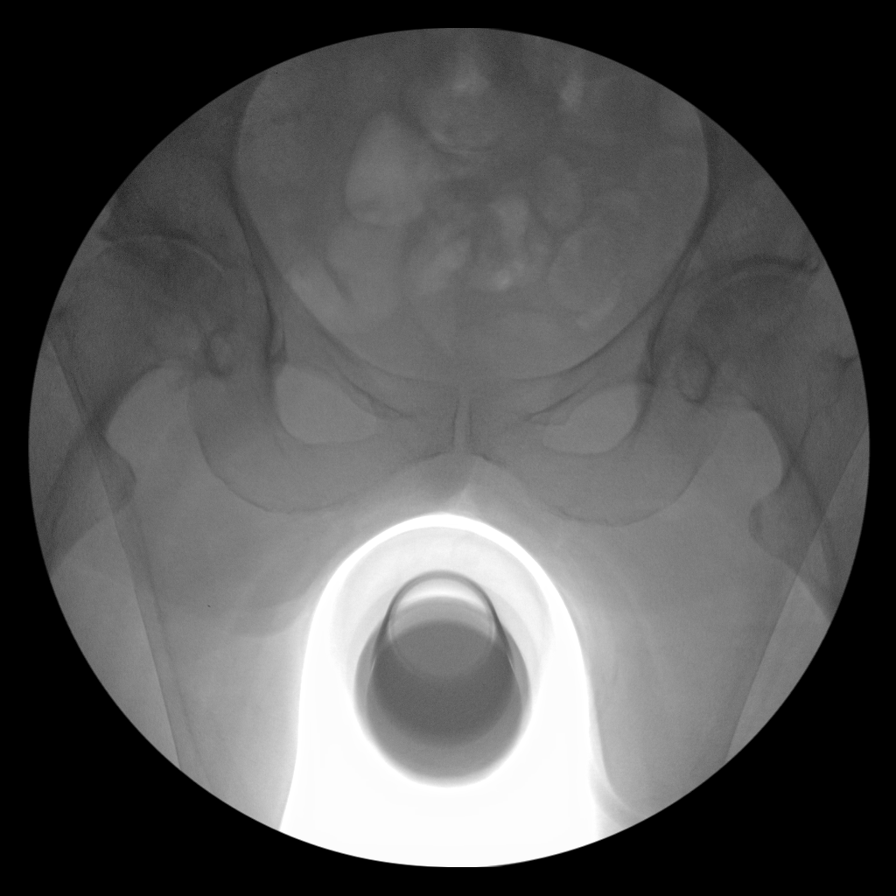
[im 4/6]
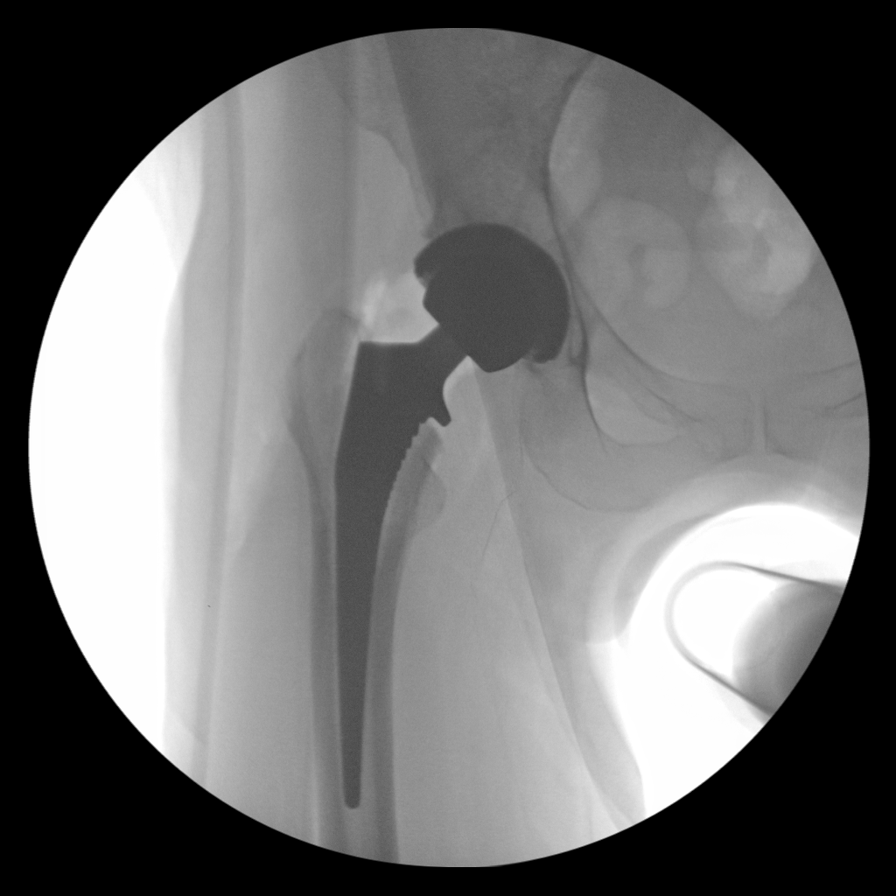
[im 5/6]
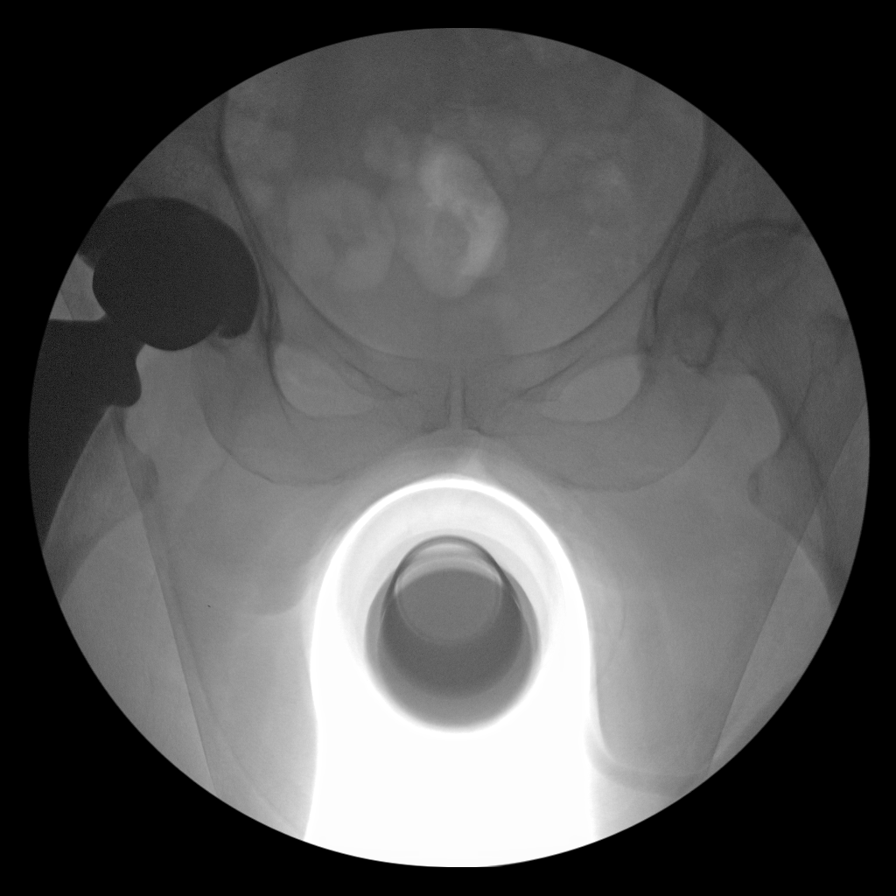
[im 6/6]
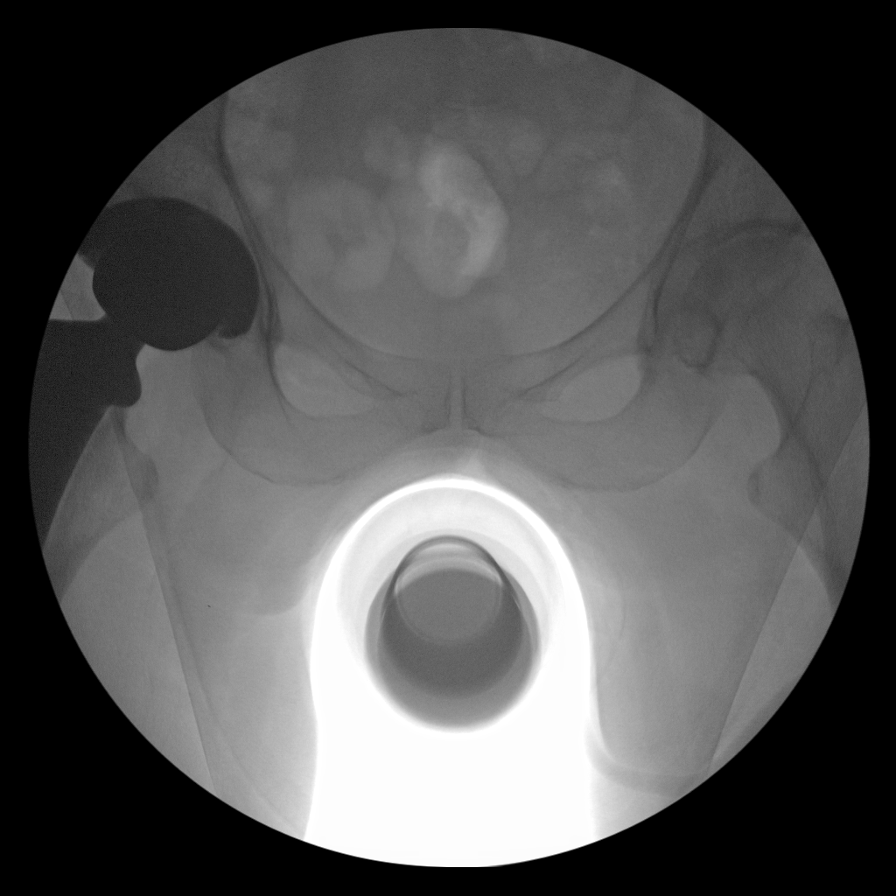

[6 of 6 positions shown; findings below may reference images not displayed]

FINDINGS: Total right hip replacement. Hardware intact. Anatomic alignment. No
acute abnormality. FOUR VIEWS. 0 MINUTES 50 SECONDS FLUOROSCOPY
TIME.
IMPRESSION: Total right hip replacement.  Anatomic alignment.

## 2018-10-16 IMAGING — RF DG HIP (WITH PELVIS) OPERATIVE*L*
1 series · 3 of 3 positions shown · non-contrast
Comparison: None.

CLINICAL DATA: Left hip replacement.

EXAM:
OPERATIVE LEFT HIP (WITH PELVIS IF PERFORMED) 3 VIEWS
TECHNIQUE: Fluoroscopic spot image(s) were submitted for interpretation
post-operatively.

[Series 1: run · 3 of 3 slices shown]
[im 1/3]
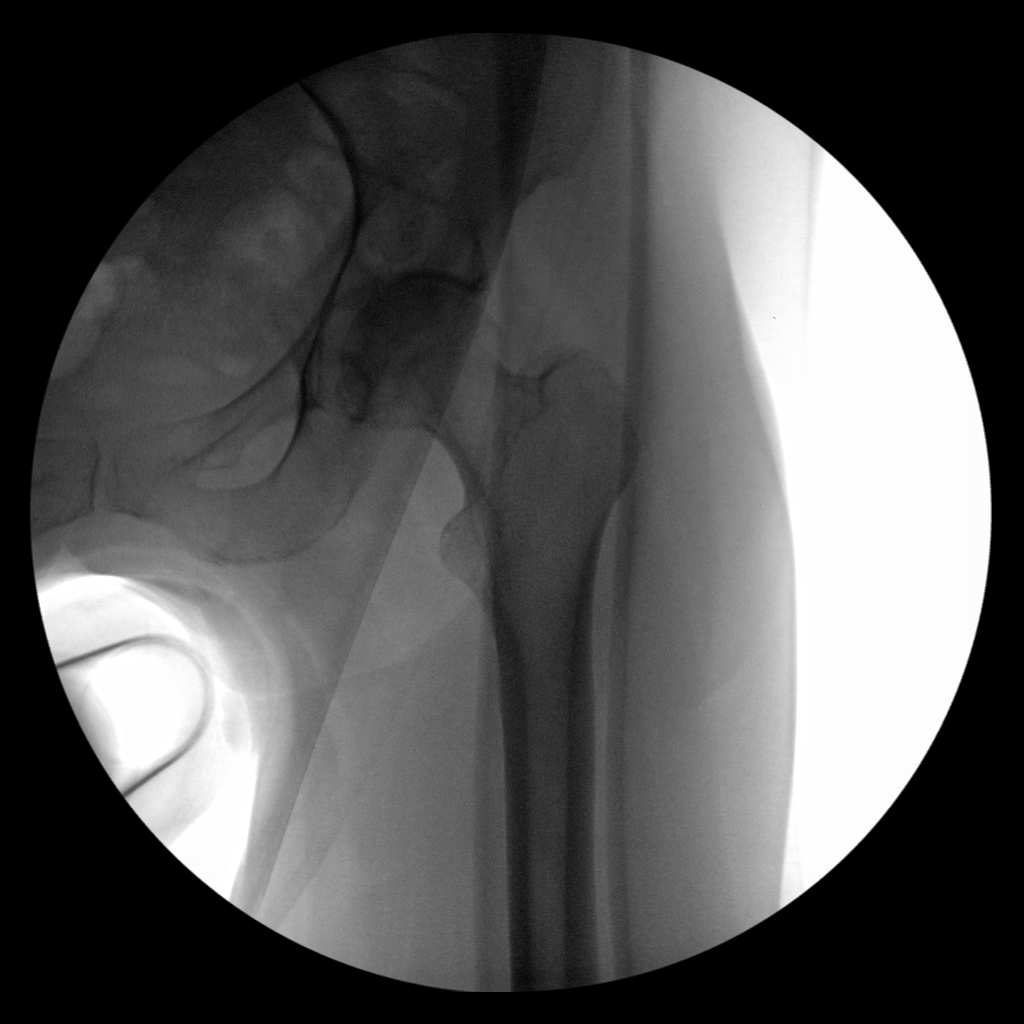
[im 2/3]
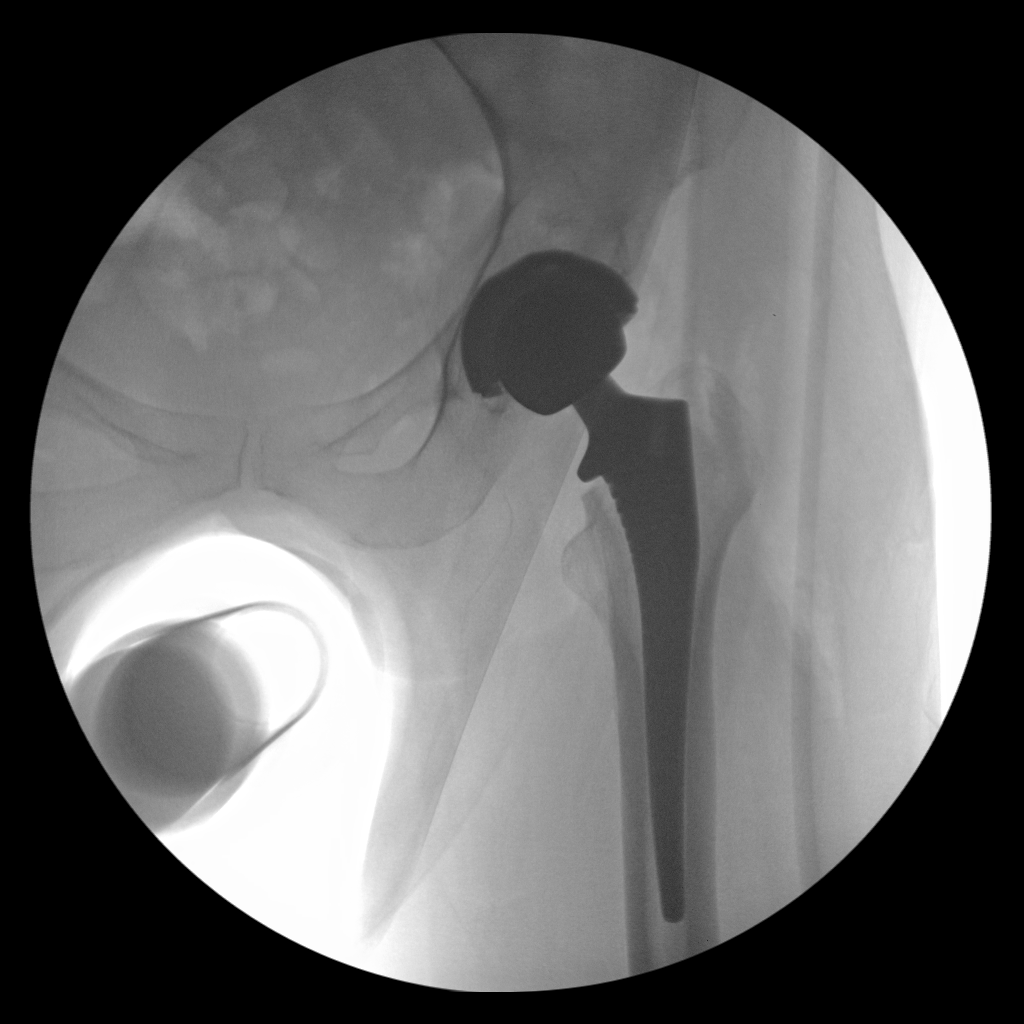
[im 3/3]
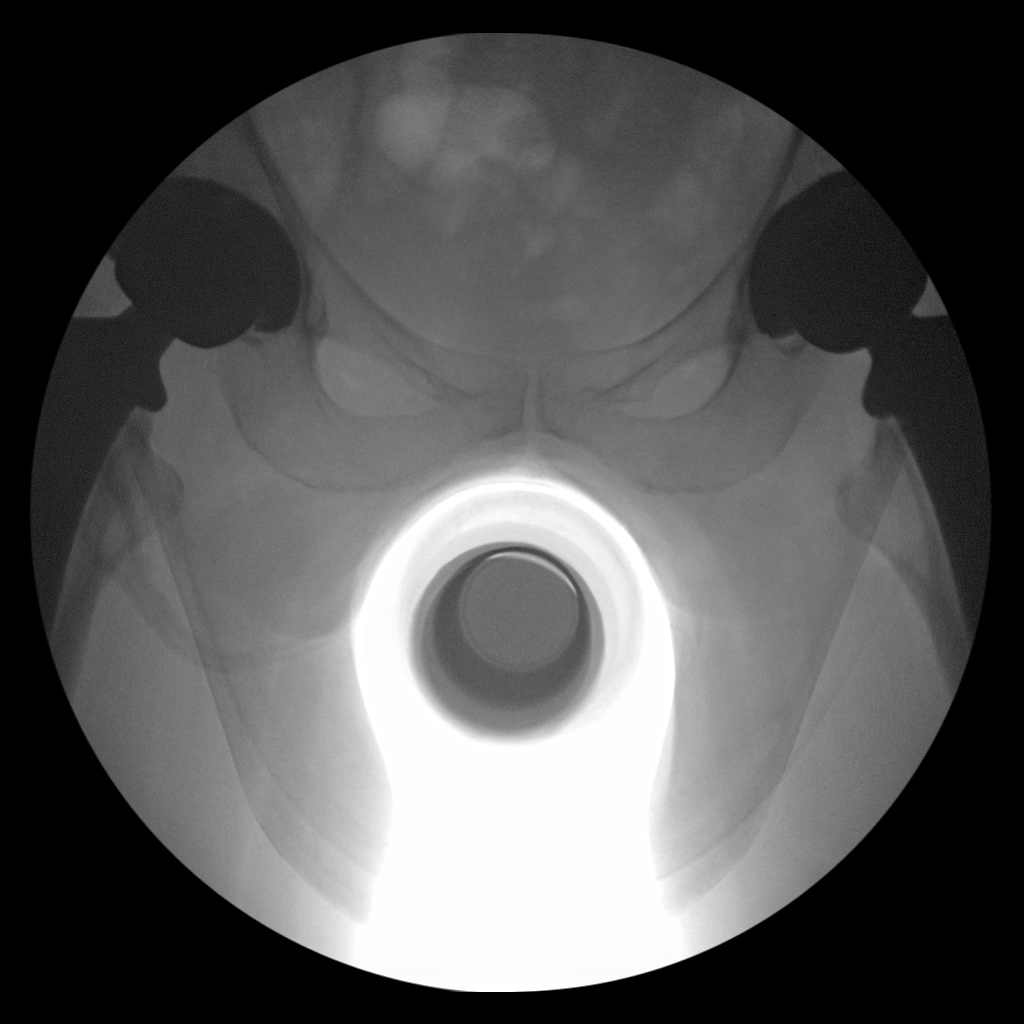

[3 of 3 positions shown; findings below may reference images not displayed]

FINDINGS: Total left hip replacement with anatomic alignment. No acute
abnormality identified.
IMPRESSION: Total left hip replacement with anatomic alignment.

## 2018-10-21 ENCOUNTER — Telehealth (INDEPENDENT_AMBULATORY_CARE_PROVIDER_SITE_OTHER): Payer: Self-pay | Admitting: *Deleted

## 2018-10-21 NOTE — Telephone Encounter (Signed)
Received call from [facility] with [facility] wanting to know the protocol for premed for dental work, pt has hip replacement on 09/2017, I advised Becky Cunningham she did not need the protocol, Dr. Magnus Ivan is X82months only after surgery.

## 2019-03-22 DIAGNOSIS — E785 Hyperlipidemia, unspecified: Secondary | ICD-10-CM | POA: Diagnosis not present

## 2019-03-22 DIAGNOSIS — H8110 Benign paroxysmal vertigo, unspecified ear: Secondary | ICD-10-CM | POA: Diagnosis not present

## 2019-03-22 DIAGNOSIS — R739 Hyperglycemia, unspecified: Secondary | ICD-10-CM | POA: Diagnosis not present

## 2019-03-22 DIAGNOSIS — Z5181 Encounter for therapeutic drug level monitoring: Secondary | ICD-10-CM | POA: Diagnosis not present

## 2019-03-22 DIAGNOSIS — Z1211 Encounter for screening for malignant neoplasm of colon: Secondary | ICD-10-CM | POA: Diagnosis not present

## 2019-03-22 DIAGNOSIS — Z0000 Encounter for general adult medical examination without abnormal findings: Secondary | ICD-10-CM | POA: Diagnosis not present

## 2019-03-24 DIAGNOSIS — Z1211 Encounter for screening for malignant neoplasm of colon: Secondary | ICD-10-CM | POA: Diagnosis not present

## 2019-04-27 IMAGING — NM NM BONE 3 PHASE
10 series · 20 of 20 positions shown · non-contrast
Comparison: None.

CLINICAL DATA: Bilateral knee replacements.  Painful RIGHT knee.

EXAM:
NUCLEAR MEDICINE 3-PHASE BONE SCAN
TECHNIQUE: Radionuclide angiographic images, immediate static blood pool
images, and 3-hour delayed static images were obtained of the knees
after intravenous injection of radiopharmaceutical.
RADIOPHARMACEUTICALS:  21.7 mCi 4c-YYm MDP IV

[Series 1: flow · 2.07mm/px · 6 of 48 frames shown (1 of 2)]
[frame 5/48]
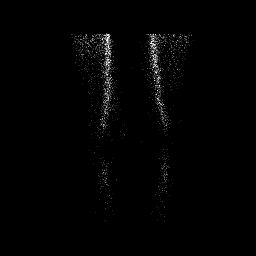
[frame 13/48  full-range]
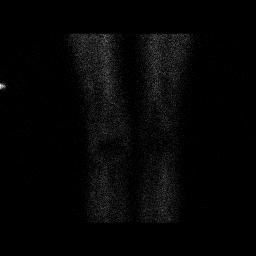
[frame 21/48  full-range]
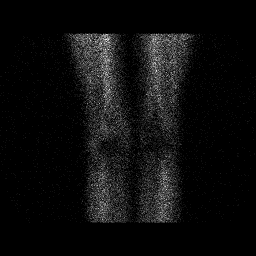
[frame 29/48  full-range]
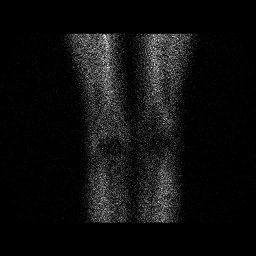
[frame 37/48  full-range]
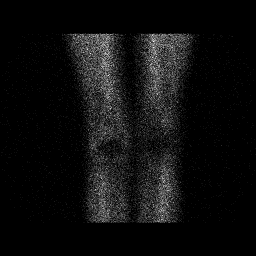
[frame 45/48  full-range]
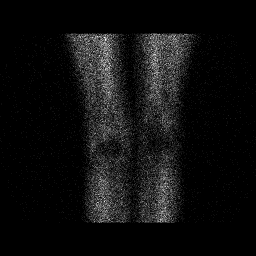

[Series 1: flow · 2.07mm/px · 6 of 48 frames shown (2 of 2)]
[frame 5/48  full-range]
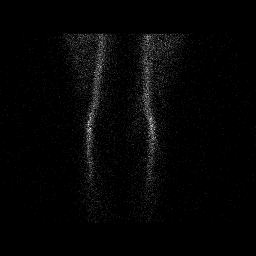
[frame 13/48  full-range]
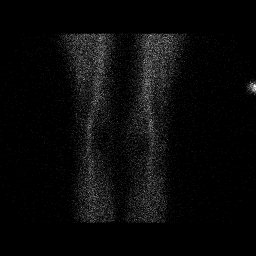
[frame 21/48  full-range]
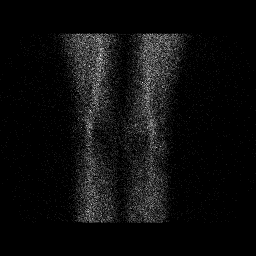
[frame 29/48  full-range]
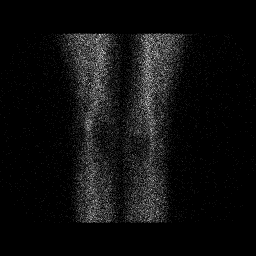
[frame 37/48  full-range]
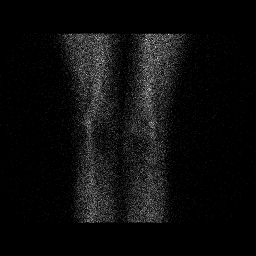
[frame 45/48  full-range]
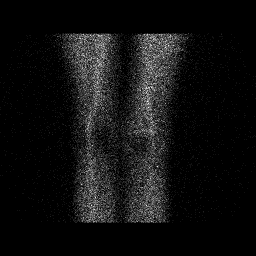

[Series 2: blood pool · 2.07mm/px · 1 of 1 slices shown (1 of 2)]
[im 1/1]
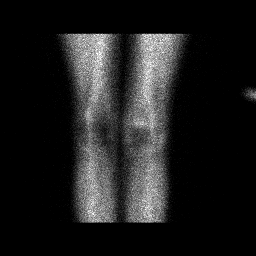

[Series 2: blood pool · 2.07mm/px · 1 of 1 slices shown (2 of 2)]
[im 1/1]
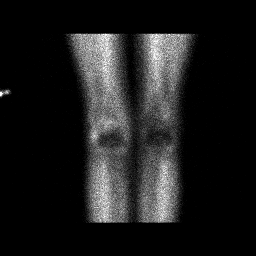

[Series 3: lat bp · 2.07mm/px · 1 of 1 slices shown (1 of 2)]
[im 1/1]
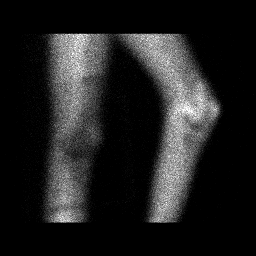

[Series 3: lat bp · 2.07mm/px · 1 of 1 slices shown (2 of 2)]
[im 1/1]
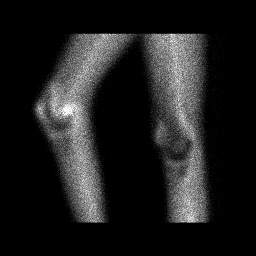

[Series 4: delay · delayed · 2.07mm/px · 1 of 1 slices shown (1 of 4)]
[im 1/1]
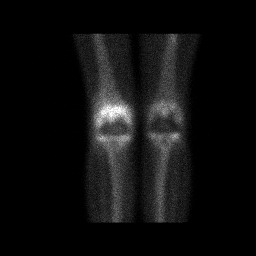

[Series 4: delay · delayed · 2.07mm/px · 1 of 1 slices shown (2 of 4)]
[im 1/1]
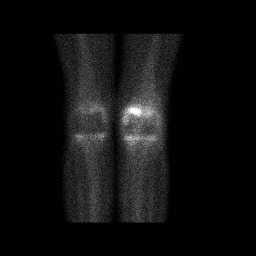

[Series 5: delay · delayed · 2.07mm/px · 1 of 1 slices shown (3 of 4)]
[im 1/1]
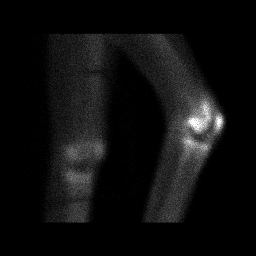

[Series 5: delay · delayed · 2.07mm/px · 1 of 1 slices shown (4 of 4)]
[im 1/1]
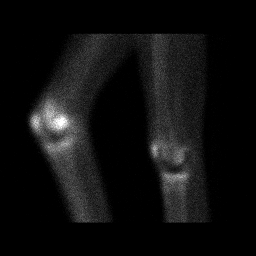

[20 of 20 positions shown; findings below may reference images not displayed]

FINDINGS: Vascular phase: No asymmetric or increased blood flow to the LEFT or
RIGHT knee.

Blood pool phase: No asymmetric or increased blood pool activity.

Delayed phase: Increased radiotracer uptake on delayed imaging
within the RIGHT femoral metaphysis superior to the prosthetic.
Symmetric mild uptake in the tibial plateaus. Mild uptake in LEFT
femur.
IMPRESSION: Asymmetric increased radiotracer accumulation within the RIGHT
femoral metaphysis superior to the prosthetic. No increased blood
flow to this site to suggest loosening or infection.

## 2019-05-26 DIAGNOSIS — Z23 Encounter for immunization: Secondary | ICD-10-CM | POA: Diagnosis not present

## 2019-06-08 DIAGNOSIS — Z20828 Contact with and (suspected) exposure to other viral communicable diseases: Secondary | ICD-10-CM | POA: Diagnosis not present

## 2019-09-23 DIAGNOSIS — F3342 Major depressive disorder, recurrent, in full remission: Secondary | ICD-10-CM | POA: Diagnosis not present

## 2019-09-23 DIAGNOSIS — R030 Elevated blood-pressure reading, without diagnosis of hypertension: Secondary | ICD-10-CM | POA: Diagnosis not present

## 2019-09-23 DIAGNOSIS — F900 Attention-deficit hyperactivity disorder, predominantly inattentive type: Secondary | ICD-10-CM | POA: Diagnosis not present

## 2019-09-23 DIAGNOSIS — E785 Hyperlipidemia, unspecified: Secondary | ICD-10-CM | POA: Diagnosis not present

## 2019-10-23 ENCOUNTER — Ambulatory Visit: Payer: Medicare Other

## 2019-10-24 ENCOUNTER — Other Ambulatory Visit: Payer: Self-pay

## 2019-10-24 ENCOUNTER — Ambulatory Visit (INDEPENDENT_AMBULATORY_CARE_PROVIDER_SITE_OTHER): Payer: Medicare Other | Admitting: Physician Assistant

## 2019-10-24 ENCOUNTER — Encounter: Payer: Self-pay | Admitting: Physician Assistant

## 2019-10-24 DIAGNOSIS — M2012 Hallux valgus (acquired), left foot: Secondary | ICD-10-CM

## 2019-10-24 NOTE — Progress Notes (Signed)
Office Visit Note   Patient: Becky Cunningham           Date of Birth: Jul 27, 1951           MRN: [id number] Visit Date: 10/24/2019              Requested by: Shirlean Mylar, MD [address] [place],  [place] [address] PCP: Shirlean Mylar, MD   Assessment & Plan: Visit Diagnoses:  1. Hallux valgus (acquired), left foot     Plan: We will refer her to Dr. Susa Simmonds for evaluation of her left foot pain that is failed conservative treatment.  She will continue her orthotics.  Recommended she use Voltaren gel 4 times a day over the first TMT joint area.  Follow-up as needed  Follow-Up Instructions: Return if symptoms worsen or fail to improve.   Orders:  No orders of the defined types were placed in this encounter.  No orders of the defined types were placed in this encounter.     Procedures: No procedures performed   Clinical Data: No additional findings.   Subjective: Chief Complaint  Patient presents with  . Left Foot - Pain    HPI Becky Cunningham comes in today due to continued left foot pain.  States the inserts helped for a while but the pain is worse in the left foot.  She said no known injury.  States she has sharp achy pain in the foot constantly.  Pain radiates dorsal aspect of her foot and into her great toe area.  She also notes that she is starting to offload the foot and walking on the lateral aspect of foot due to the pain.  Notes some swelling of the left foot compared to the right.   Films in 2019 that were nonweightbearing did show dorsal spurring at the first TMT joint.  Slight valgus valgus deformity.  No other bony abnormalities. Review of Systems Negative for fevers, chills, shortness of breath or chest pain  Objective: Vital Signs: There were no vitals taken for this visit.  Physical Exam Constitutional:      Appearance: She is not ill-appearing or diaphoretic.  Pulmonary:     Effort: Pulmonary effort is normal.  Neurological:     Mental  Status: She is alert and oriented to person, place, and time.  Psychiatric:        Mood and Affect: Mood normal.     Ortho Exam Left foot no rashes skin lesions ulcerations or impending ulcers.  Dorsal pedal pulse 2+.  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  Hallux valgus deformity both feet left greater than right.  Tenderness over the first TMT joint left foot.  Callus formation at the left first metatarsal head plantar aspect. Specialty Comments:  No specialty comments available.  Imaging: No results found.   PMFS History: Patient Active Problem List   Diagnosis Date Noted  . Polyethylene wear of right knee prosthesis, initial encounter (HCC) 05/06/2018  . Status post revision of total knee, right 05/06/2018  . Bilateral primary osteoarthritis of hip 09/18/2017  . History of bilateral hip arthroplasty 09/18/2017  . Status post bilateral hip replacements 09/18/2017  . Unilateral primary osteoarthritis, left hip 07/07/2017  . Unilateral primary osteoarthritis, right hip 07/07/2017  . Pain in left wrist 03/30/2017  . Primary osteoarthritis of both knees 08/25/2014  . Status post bilateral knee replacements 08/25/2014   Past Medical History:  Diagnosis Date  . Arthritis    "qwhere;  dx'd in 1980" (05/06/2018)  . Depression   . Effusion of knee    Recurrent- Right  . History of blood transfusion 09/2017   "related to hip OR"    History reviewed. No pertinent family history.  Past Surgical History:  Procedure Laterality Date  . BILATERAL ANTERIOR TOTAL HIP ARTHROPLASTY Bilateral 09/18/2017   Procedure: BILATERAL ANTERIOR TOTAL HIP ARTHROPLASTY;  Surgeon: Mcarthur Rossetti, MD;  Location: [facility];  Service: Orthopedics;  Laterality: Bilateral;  . DILATION AND CURETTAGE OF UTERUS    . JOINT REPLACEMENT    . KNEE ARTHROSCOPY Left 2009  . SYNOVECTOMY WITH POLY EXCHANGE Right 05/06/2018   knee  . TMJ similar surgery     . TOTAL KNEE ARTHROPLASTY Bilateral  08/25/2014   Procedure: TOTAL KNEE BILATERAL;  Surgeon: Mcarthur Rossetti, MD;  Location: [facility];  Service: Orthopedics;  Laterality: Bilateral;  . TOTAL KNEE REVISION Right 05/06/2018   Procedure: Right knee polyexchange with synovectomy;  Surgeon: Mcarthur Rossetti, MD;  Location: [facility];  Service: Orthopedics;  Laterality: Right;  . WISDOM TOOTH EXTRACTION     Social History   Occupational History  . Not on file  Tobacco Use  . Smoking status: Never Smoker  . Smokeless tobacco: Never Used  Substance and Sexual Activity  . Alcohol use: Yes    Alcohol/week: 12.0 standard drinks    Types: 12 Glasses of wine per week    Comment: 05/06/2018 ""2 bottles/week; at least"  . Drug use: Yes    Types: Marijuana    Comment: 05/06/2018 "weekends"  . Sexual activity: Yes

## 2019-10-24 NOTE — Addendum Note (Signed)
Addended by: Mardene Celeste B on: 10/24/2019 01:50 PM   Modules accepted: Orders

## 2019-11-02 DIAGNOSIS — M2012 Hallux valgus (acquired), left foot: Secondary | ICD-10-CM | POA: Diagnosis not present

## 2019-11-02 DIAGNOSIS — M19072 Primary osteoarthritis, left ankle and foot: Secondary | ICD-10-CM | POA: Diagnosis not present

## 2019-11-17 ENCOUNTER — Encounter: Payer: Self-pay | Admitting: Orthopaedic Surgery

## 2019-11-17 ENCOUNTER — Other Ambulatory Visit: Payer: Self-pay

## 2019-11-17 ENCOUNTER — Ambulatory Visit (INDEPENDENT_AMBULATORY_CARE_PROVIDER_SITE_OTHER): Payer: Medicare Other

## 2019-11-17 ENCOUNTER — Ambulatory Visit (INDEPENDENT_AMBULATORY_CARE_PROVIDER_SITE_OTHER): Payer: Medicare Other | Admitting: Orthopaedic Surgery

## 2019-11-17 VITALS — Ht 66.0 in | Wt 128.0 lb

## 2019-11-17 DIAGNOSIS — M25572 Pain in left ankle and joints of left foot: Secondary | ICD-10-CM

## 2019-11-17 NOTE — Progress Notes (Signed)
Office Visit Note   Patient: Becky Cunningham           Date of Birth: 1950-10-06           MRN: [id number] Visit Date: 11/17/2019              Requested by: [name], MD [place],  [place] [id number] PCP: [name], MD   Assessment & Plan: Visit Diagnoses:  1. Pain in left ankle and joints of left foot     Plan: Left foot pain could be a combination of issues including midfoot arthritis and arthritis at the metatarsal phalangeal joint great toe.  Long discussion regarding appropriate shoes and inserts.  Also having some chronic pain and swelling about her ankle with early arthritis on films and possibly some pathology about the posterior tibial tendon.  Will order MRI scan of left ankle  Follow-Up Instructions: Return After MRI scan left ankle.   Orders:  Orders Placed This Encounter  Procedures  . XR Ankle Complete Left  . XR Foot 2 Views Left  . MR Ankle Left w/o contrast   No orders of the defined types were placed in this encounter.     Procedures: No procedures performed   Clinical Data: No additional findings.   Subjective: Chief Complaint  Patient presents with  . Left Foot - Pain  Patient presents today for left foot pain. She saw Benita Stabile on 10/24/2019. She said that it has hurt for two years. She said that it can ache anytime, but has been experiencing recently extreme burning sensations. She states that she has midfoot arthritis, bunion, and has a second hammertoe. She also complains of pain in her left ankle.  Walks as an exercise and finds it more more difficult.  She has inserts for her shoes.  No history of injury or trauma.  Has pain about the ankle medially as well as the dorsum of her foot and her great toe.  Has had prior hip and knee replacement surgery by Dr. Ninfa Linden and doing quite well.  Not diabetic  HPI  Review of Systems  Constitutional: Negative for fatigue.  HENT: Negative for ear pain.   Eyes: Negative  for pain.  Respiratory: Negative for shortness of breath.   Gastrointestinal: Negative for constipation and diarrhea.  Endocrine: Negative for cold intolerance.  Genitourinary: Negative for difficulty urinating.  Musculoskeletal: Positive for joint swelling.  Skin: Negative for rash.  Allergic/Immunologic: Negative for food allergies.  Neurological: Negative for weakness.  Hematological: Does not bruise/bleed easily.  Psychiatric/Behavioral: Negative for sleep disturbance.     Objective: Vital Signs: Ht 5\' 6"  (1.676 m)   Wt 128 lb (58.1 kg)   BMI 20.66 kg/m   Physical Exam Constitutional:      Appearance: She is well-developed.  Eyes:     Pupils: Pupils are equal, round, and reactive to light.  Pulmonary:     Effort: Pulmonary effort is normal.  Skin:    General: Skin is warm and dry.  Neurological:     Mental Status: She is alert and oriented to person, place, and time.  Psychiatric:        Behavior: Behavior normal.     Ortho Exam awake alert and oriented x3 and comfortable sitting.  Examination left ankle there was some diffuse circumferential swelling with tenderness in the area of the posterior tibial tendon.  Motor exam intact.  Posterior tibial tendon function intact.  No pain laterally.  Achilles is intact and might be slightly tight.  Good arch.  Normal sensation to her feet and good capillary refill.  Does have some dorsal spurring in the midfoot at the level of the medial cuneiform second metatarsal base.  Does not have any loss of motion of the great toe but certainly has some pain and crepitation.  Has a small bunion but no redness about the skin and no plantar calluses.  Specialty Comments:  No specialty comments available.  Imaging: XR Ankle Complete Left  Result Date: 11/17/2019 Films of the left ankle were obtained in 2 projections.  As previously mentioned there is a plantar heel spur.  On the lateral film there are some subchondral cysts in the distal  tibia at the tibiotalar articulation but the joint space is well-maintained on the AP.  This certainly could be consistent with some early degenerative changes.  No acute changes  XR Foot 2 Views Left  Result Date: 11/17/2019 X-rays of the left foot were obtained in several projections.  In the lateral there is a plantar heel spur with the patient is not symptomatic.  There are some degenerative changes in the mid midfoot with dorsal bossing between the middle cuneiform and the second metatarsal.  There are degenerative changes at the first metatarsal phalangeal joint with subchondral cyst.  The joint space is well-maintained.  No acute changes.    PMFS History: Patient Active Problem List   Diagnosis Date Noted  . Pain in left ankle and joints of left foot 11/17/2019  . Polyethylene wear of right knee prosthesis, initial encounter (HCC) 05/06/2018  . Status post revision of total knee, right 05/06/2018  . Bilateral primary osteoarthritis of hip 09/18/2017  . History of bilateral hip arthroplasty 09/18/2017  . Status post bilateral hip replacements 09/18/2017  . Unilateral primary osteoarthritis, left hip 07/07/2017  . Unilateral primary osteoarthritis, right hip 07/07/2017  . Pain in left wrist 03/30/2017  . Primary osteoarthritis of both knees 08/25/2014  . Status post bilateral knee replacements 08/25/2014   Past Medical History:  Diagnosis Date  . Arthritis    "qwhere; dx'd in 1980" (05/06/2018)  . Depression   . Effusion of knee    Recurrent- Right  . History of blood transfusion 09/2017   "related to hip OR"    History reviewed. No pertinent family history.  Past Surgical History:  Procedure Laterality Date  . BILATERAL ANTERIOR TOTAL HIP ARTHROPLASTY Bilateral 09/18/2017   Procedure: BILATERAL ANTERIOR TOTAL HIP ARTHROPLASTY;  Surgeon: Kathryne Hitch, MD;  Location: [facility];  Service: Orthopedics;  Laterality: Bilateral;  . DILATION AND CURETTAGE OF UTERUS    .  JOINT REPLACEMENT    . KNEE ARTHROSCOPY Left 2009  . SYNOVECTOMY WITH POLY EXCHANGE Right 05/06/2018   knee  . TMJ similar surgery     . TOTAL KNEE ARTHROPLASTY Bilateral 08/25/2014   Procedure: TOTAL KNEE BILATERAL;  Surgeon: Kathryne Hitch, MD;  Location: [facility];  Service: Orthopedics;  Laterality: Bilateral;  . TOTAL KNEE REVISION Right 05/06/2018   Procedure: Right knee polyexchange with synovectomy;  Surgeon: Kathryne Hitch, MD;  Location: [facility];  Service: Orthopedics;  Laterality: Right;  . WISDOM TOOTH EXTRACTION     Social History   Occupational History  . Not on file  Tobacco Use  . Smoking status: Never Smoker  . Smokeless tobacco: Never Used  Substance and Sexual Activity  . Alcohol use: Yes    Alcohol/week: 12.0 standard drinks    Types:  12 Glasses of wine per week    Comment: 05/06/2018 ""2 bottles/week; at least"  . Drug use: Yes    Types: Marijuana    Comment: 05/06/2018 "weekends"  . Sexual activity: Yes

## 2019-12-12 ENCOUNTER — Other Ambulatory Visit: Payer: Self-pay

## 2019-12-12 ENCOUNTER — Ambulatory Visit
Admission: RE | Admit: 2019-12-12 | Discharge: 2019-12-12 | Disposition: A | Discharge status: Home / Self Care | Payer: Medicare Other | Source: Ambulatory Visit | Attending: Orthopaedic Surgery | Admitting: Orthopaedic Surgery

## 2019-12-12 DIAGNOSIS — M25572 Pain in left ankle and joints of left foot: Secondary | ICD-10-CM

## 2019-12-12 DIAGNOSIS — S86312A Strain of muscle(s) and tendon(s) of peroneal muscle group at lower leg level, left leg, initial encounter: Secondary | ICD-10-CM | POA: Diagnosis not present

## 2019-12-15 ENCOUNTER — Ambulatory Visit: Payer: Medicare Other | Admitting: Orthopaedic Surgery

## 2019-12-20 ENCOUNTER — Other Ambulatory Visit: Payer: Self-pay

## 2019-12-20 ENCOUNTER — Encounter: Payer: Self-pay | Admitting: Orthopaedic Surgery

## 2019-12-20 ENCOUNTER — Ambulatory Visit (INDEPENDENT_AMBULATORY_CARE_PROVIDER_SITE_OTHER): Payer: Medicare Other | Admitting: Orthopaedic Surgery

## 2019-12-20 VITALS — Ht 66.0 in | Wt 128.0 lb

## 2019-12-20 DIAGNOSIS — M25572 Pain in left ankle and joints of left foot: Secondary | ICD-10-CM

## 2019-12-20 NOTE — Progress Notes (Signed)
Office Visit Note   Patient: Becky Cunningham           Date of Birth: May 05, 1951           MRN: [id number] Visit Date: 12/20/2019              Requested by: [name], MD [address] [place],  [place] [address] PCP: [name], MD   Assessment & Plan: Visit Diagnoses:  1. Pain in left ankle and joints of left foot     Plan: I reviewed the MRI scan of the left ankle in detail.  There is a longitudinal split tear of the peroneus brevis at the level of the ankle.  There is a small amount of fluid surrounding the peroneal tendons.  There is some mild inflammation of the flexor digitorum longus and posterior tibialis.  There appeared to be a partial disruption of the anterior talofibular ligament with surrounding soft tissue edema and a small amount of fluid within the syndesmosis.  Posterior talofibular ligament and anterior and posterior tib-fib ligaments were intact.  Deltoid ligament was intact.  Trace ankle joint effusion but no chondral defect.  There was advanced osteoarthritis at the second tarsometatarsal joint with joint space loss and subchondral cystic changes and also osteoarthritis of the third and fourth metatarsal cuneiform joint.  Becky Cunningham appears to be more symptomatic in the midfoot.  We have had a long discussion regarding each of the above findings.  I think it might be worthwhile for her to be wearing good comfortable shoes when she is walking or hiking and might even want to try an ankle support.  We applied the ankle support and she would like to use it when she is on her feet walking for exercise.  Answered all questions.  We will plan to see her back as needed  Follow-Up Instructions: Return if symptoms worsen or fail to improve.   Orders:  No orders of the defined types were placed in this encounter.  No orders of the defined types were placed in this encounter.     Procedures: No procedures performed   Clinical Data: No additional  findings.   Subjective: Chief Complaint  Patient presents with  . Left Ankle - Follow-up    MRI results  Patient presents today for follow up on her left ankle. She had an MRI on 12/12/2019 and is here today for those results. No changes since her last visit. She takes Etodolac for pain and applies Voltaren gel.   HPI  Review of Systems   Objective: Vital Signs: Ht 5\' 6"  (1.676 m)   Wt 128 lb (58.1 kg)   BMI 20.66 kg/m   Physical Exam Constitutional:      Appearance: She is well-developed.  Eyes:     Pupils: Pupils are equal, round, and reactive to light.  Pulmonary:     Effort: Pulmonary effort is normal.  Skin:    General: Skin is warm and dry.  Neurological:     Mental Status: She is alert and oriented to person, place, and time.  Psychiatric:        Behavior: Behavior normal.     Ortho Exam left ankle.  Peroneal tendons intact with no swelling or tenderness.  Very minimal tenderness over the anterior talofibular ligament.  There does not appear to be an increased talar tilt or anterior drawer sign.  There are hypertrophic changes at the second third and fourth tarsometatarsal joints with some discomfort.  Arch is somewhat lower than that on the right side.  Skin intact.  Neurologically intact but.  Good capillary refill to toes  Specialty Comments:  No specialty comments available.  Imaging: No results found.   PMFS History: Patient Active Problem List   Diagnosis Date Noted  . Pain in left ankle and joints of left foot 11/17/2019  . Polyethylene wear of right knee prosthesis, initial encounter (HCC) 05/06/2018  . Status post revision of total knee, right 05/06/2018  . Bilateral primary osteoarthritis of hip 09/18/2017  . History of bilateral hip arthroplasty 09/18/2017  . Status post bilateral hip replacements 09/18/2017  . Unilateral primary osteoarthritis, left hip 07/07/2017  . Unilateral primary osteoarthritis, right hip 07/07/2017  . Pain in left wrist  03/30/2017  . Primary osteoarthritis of both knees 08/25/2014  . Status post bilateral knee replacements 08/25/2014   Past Medical History:  Diagnosis Date  . Arthritis    "qwhere; dx'd in 1980" (05/06/2018)  . Depression   . Effusion of knee    Recurrent- Right  . History of blood transfusion 09/2017   "related to hip OR"    History reviewed. No pertinent family history.  Past Surgical History:  Procedure Laterality Date  . BILATERAL ANTERIOR TOTAL HIP ARTHROPLASTY Bilateral 09/18/2017   Procedure: BILATERAL ANTERIOR TOTAL HIP ARTHROPLASTY;  Surgeon: Kathryne Hitch, MD;  Location: [facility];  Service: Orthopedics;  Laterality: Bilateral;  . DILATION AND CURETTAGE OF UTERUS    . JOINT REPLACEMENT    . KNEE ARTHROSCOPY Left 2009  . SYNOVECTOMY WITH POLY EXCHANGE Right 05/06/2018   knee  . TMJ similar surgery     . TOTAL KNEE ARTHROPLASTY Bilateral 08/25/2014   Procedure: TOTAL KNEE BILATERAL;  Surgeon: Kathryne Hitch, MD;  Location: [facility];  Service: Orthopedics;  Laterality: Bilateral;  . TOTAL KNEE REVISION Right 05/06/2018   Procedure: Right knee polyexchange with synovectomy;  Surgeon: Kathryne Hitch, MD;  Location: [facility];  Service: Orthopedics;  Laterality: Right;  . WISDOM TOOTH EXTRACTION     Social History   Occupational History  . Not on file  Tobacco Use  . Smoking status: Never Smoker  . Smokeless tobacco: Never Used  Substance and Sexual Activity  . Alcohol use: Yes    Alcohol/week: 12.0 standard drinks    Types: 12 Glasses of wine per week    Comment: 05/06/2018 ""2 bottles/week; at least"  . Drug use: Yes    Types: Marijuana    Comment: 05/06/2018 "weekends"  . Sexual activity: Yes

## 2020-04-04 DIAGNOSIS — R739 Hyperglycemia, unspecified: Secondary | ICD-10-CM | POA: Diagnosis not present

## 2020-04-04 DIAGNOSIS — M8580 Other specified disorders of bone density and structure, unspecified site: Secondary | ICD-10-CM | POA: Diagnosis not present

## 2020-04-04 DIAGNOSIS — E785 Hyperlipidemia, unspecified: Secondary | ICD-10-CM | POA: Diagnosis not present

## 2020-04-04 DIAGNOSIS — M1990 Unspecified osteoarthritis, unspecified site: Secondary | ICD-10-CM | POA: Diagnosis not present

## 2020-04-04 DIAGNOSIS — F900 Attention-deficit hyperactivity disorder, predominantly inattentive type: Secondary | ICD-10-CM | POA: Diagnosis not present

## 2020-04-04 DIAGNOSIS — Z5181 Encounter for therapeutic drug level monitoring: Secondary | ICD-10-CM | POA: Diagnosis not present

## 2020-04-04 DIAGNOSIS — F331 Major depressive disorder, recurrent, moderate: Secondary | ICD-10-CM | POA: Diagnosis not present

## 2020-04-04 DIAGNOSIS — R030 Elevated blood-pressure reading, without diagnosis of hypertension: Secondary | ICD-10-CM | POA: Diagnosis not present

## 2020-04-04 DIAGNOSIS — Z0000 Encounter for general adult medical examination without abnormal findings: Secondary | ICD-10-CM | POA: Diagnosis not present

## 2020-04-24 DIAGNOSIS — R634 Abnormal weight loss: Secondary | ICD-10-CM | POA: Diagnosis not present

## 2020-04-24 DIAGNOSIS — R29890 Loss of height: Secondary | ICD-10-CM | POA: Diagnosis not present

## 2020-04-24 DIAGNOSIS — M85832 Other specified disorders of bone density and structure, left forearm: Secondary | ICD-10-CM | POA: Diagnosis not present

## 2020-04-24 DIAGNOSIS — Z780 Asymptomatic menopausal state: Secondary | ICD-10-CM | POA: Diagnosis not present

## 2020-06-08 DIAGNOSIS — Z23 Encounter for immunization: Secondary | ICD-10-CM | POA: Diagnosis not present

## 2020-06-25 DIAGNOSIS — M6248 Contracture of muscle, other site: Secondary | ICD-10-CM | POA: Diagnosis not present

## 2020-07-30 DIAGNOSIS — M546 Pain in thoracic spine: Secondary | ICD-10-CM | POA: Diagnosis not present

## 2020-08-20 ENCOUNTER — Ambulatory Visit
Admission: RE | Admit: 2020-08-20 | Discharge: 2020-08-20 | Disposition: A | Discharge status: Home / Self Care | Payer: Medicare Other | Source: Ambulatory Visit | Attending: Family Medicine | Admitting: Family Medicine

## 2020-08-20 ENCOUNTER — Other Ambulatory Visit: Payer: Self-pay | Admitting: Family Medicine

## 2020-08-20 DIAGNOSIS — M546 Pain in thoracic spine: Secondary | ICD-10-CM

## 2020-09-28 DIAGNOSIS — F900 Attention-deficit hyperactivity disorder, predominantly inattentive type: Secondary | ICD-10-CM | POA: Diagnosis not present

## 2020-09-28 DIAGNOSIS — R030 Elevated blood-pressure reading, without diagnosis of hypertension: Secondary | ICD-10-CM | POA: Diagnosis not present

## 2020-09-28 DIAGNOSIS — F3342 Major depressive disorder, recurrent, in full remission: Secondary | ICD-10-CM | POA: Diagnosis not present

## 2020-12-20 DIAGNOSIS — Z23 Encounter for immunization: Secondary | ICD-10-CM | POA: Diagnosis not present

## 2021-02-11 ENCOUNTER — Other Ambulatory Visit: Payer: Self-pay

## 2021-02-11 ENCOUNTER — Encounter: Payer: Self-pay | Admitting: Orthopaedic Surgery

## 2021-02-11 ENCOUNTER — Ambulatory Visit (INDEPENDENT_AMBULATORY_CARE_PROVIDER_SITE_OTHER): Payer: Medicare Other

## 2021-02-11 ENCOUNTER — Ambulatory Visit (INDEPENDENT_AMBULATORY_CARE_PROVIDER_SITE_OTHER): Payer: Medicare Other | Admitting: Orthopaedic Surgery

## 2021-02-11 VITALS — Ht 66.0 in | Wt 125.0 lb

## 2021-02-11 DIAGNOSIS — M25461 Effusion, right knee: Secondary | ICD-10-CM

## 2021-02-11 DIAGNOSIS — Z96651 Presence of right artificial knee joint: Secondary | ICD-10-CM | POA: Diagnosis not present

## 2021-02-11 DIAGNOSIS — M25561 Pain in right knee: Secondary | ICD-10-CM | POA: Diagnosis not present

## 2021-02-11 NOTE — Progress Notes (Signed)
Office Visit Note   Patient: Becky Cunningham           Date of Birth: 07/04/1951           MRN: [id number] Visit Date: 02/11/2021              Requested by: Shirlean Mylar, MD [address] [place],  [place] [address] PCP: Shirlean Mylar, MD   Assessment & Plan: Visit Diagnoses:  1. Acute pain of right knee   2. History of total right knee replacement   3. Effusion, right knee     Plan: Hopefully this is just an acute injury to the knee.  I did aspirate about 20 cc of yellow fluid from the knee that did become blood-tinged.  I would like to see her back in 6 weeks for repeat exam.  If she continues to have intermittent swelling in that knee I three-phase bone scan be warranted to rule out prosthetic loosening.  All questions and concerns were answered and addressed.  Follow-Up Instructions: Return in about 6 weeks (around 03/25/2021).   Orders:  Orders Placed This Encounter  Procedures  . XR KNEE 3 VIEW RIGHT   No orders of the defined types were placed in this encounter.     Procedures: No procedures performed   Clinical Data: No additional findings.   Subjective: Chief Complaint  Patient presents with  . Right Knee - Pain  The patient is about 7 years out from bilateral knee replacements.  Those were done in 2015.  In 2019 we did have to perform a synovectomy on the right knee and a polyliner exchange with upsizing her probably due to instability knee.  She said the knee has been doing well but about 1 week.  She is coming downstairs and lost balance and twisted her knee.  Is been little swollen since then and painful to walk on.  She not needing to take any medicines for this.  She is a very active 70 year old female and just recently traveled back from [place] to visit her son.  She has had no otherwise acute change in her medical status.  She is not a diabetic.  HPI  Review of Systems There is currently listed no headache, chest pain, shortness of  breath, fever, chills, nausea, vomiting  Objective: Vital Signs: Ht 5\' 6"  (1.676 m)   Wt 125 lb (56.7 kg)   BMI 20.18 kg/m   Physical Exam She is alert and orient x3 and in no acute distress Ortho Exam Examination of her right knee shows well-healed surgical incision.  There is a mild to moderate effusion of the right knee but no redness.  She has a painful range of motion of that knee and it feels ligamentously stable. Specialty Comments:  No specialty comments available.  Imaging: XR KNEE 3 VIEW RIGHT  Result Date: 02/11/2021 An AP and lateral right knee show a total knee arthroplasty with no acute findings.  These films were compared to films from 2-1/2 years ago.  There is slight lucency just medial at the tibial plateau area that is slightly increased from the last films.  Both femoral and tibial components appear well-seated.    PMFS History: Patient Active Problem List   Diagnosis Date Noted  . Pain in left ankle and joints of left foot 11/17/2019  . Polyethylene wear of right knee prosthesis, initial encounter (HCC) 05/06/2018  . Status post revision of total knee, right 05/06/2018  . Bilateral primary osteoarthritis  of hip 09/18/2017  . History of bilateral hip arthroplasty 09/18/2017  . Status post bilateral hip replacements 09/18/2017  . Unilateral primary osteoarthritis, left hip 07/07/2017  . Unilateral primary osteoarthritis, right hip 07/07/2017  . Pain in left wrist 03/30/2017  . Primary osteoarthritis of both knees 08/25/2014  . Status post bilateral knee replacements 08/25/2014   Past Medical History:  Diagnosis Date  . Arthritis    "qwhere; dx'd in 1980" (05/06/2018)  . Depression   . Effusion of knee    Recurrent- Right  . History of blood transfusion 09/2017   "related to hip OR"    No family history on file.  Past Surgical History:  Procedure Laterality Date  . BILATERAL ANTERIOR TOTAL HIP ARTHROPLASTY Bilateral 09/18/2017   Procedure: BILATERAL  ANTERIOR TOTAL HIP ARTHROPLASTY;  Surgeon: Kathryne Hitch, MD;  Location: [facility];  Service: Orthopedics;  Laterality: Bilateral;  . DILATION AND CURETTAGE OF UTERUS    . JOINT REPLACEMENT    . KNEE ARTHROSCOPY Left 2009  . SYNOVECTOMY WITH POLY EXCHANGE Right 05/06/2018   knee  . TMJ similar surgery     . TOTAL KNEE ARTHROPLASTY Bilateral 08/25/2014   Procedure: TOTAL KNEE BILATERAL;  Surgeon: Kathryne Hitch, MD;  Location: [facility];  Service: Orthopedics;  Laterality: Bilateral;  . TOTAL KNEE REVISION Right 05/06/2018   Procedure: Right knee polyexchange with synovectomy;  Surgeon: Kathryne Hitch, MD;  Location: [facility] OR;  Service: Orthopedics;  Laterality: Right;  . WISDOM TOOTH EXTRACTION     Social History   Occupational History  . Not on file  Tobacco Use  . Smoking status: Never Smoker  . Smokeless tobacco: Never Used  Vaping Use  . Vaping Use: Never used  Substance and Sexual Activity  . Alcohol use: Yes    Alcohol/week: 12.0 standard drinks    Types: 12 Glasses of wine per week    Comment: 05/06/2018 ""2 bottles/week; at least"  . Drug use: Yes    Types: Marijuana    Comment: 05/06/2018 "weekends"  . Sexual activity: Yes

## 2021-03-25 ENCOUNTER — Other Ambulatory Visit: Payer: Self-pay

## 2021-03-25 ENCOUNTER — Ambulatory Visit (INDEPENDENT_AMBULATORY_CARE_PROVIDER_SITE_OTHER): Payer: Medicare Other | Admitting: Orthopaedic Surgery

## 2021-03-25 ENCOUNTER — Ambulatory Visit (INDEPENDENT_AMBULATORY_CARE_PROVIDER_SITE_OTHER): Payer: Medicare Other

## 2021-03-25 ENCOUNTER — Encounter: Payer: Self-pay | Admitting: Orthopaedic Surgery

## 2021-03-25 ENCOUNTER — Ambulatory Visit: Payer: Medicare Other | Admitting: Orthopaedic Surgery

## 2021-03-25 VITALS — Ht 66.0 in | Wt 125.0 lb

## 2021-03-25 DIAGNOSIS — M25531 Pain in right wrist: Secondary | ICD-10-CM

## 2021-03-25 DIAGNOSIS — S52531A Colles' fracture of right radius, initial encounter for closed fracture: Secondary | ICD-10-CM

## 2021-03-25 NOTE — Progress Notes (Signed)
Office Visit Note   Patient: Becky Cunningham           Date of Birth: 1951-07-14           MRN: [id number] Visit Date: 03/25/2021              Requested by: Shirlean Mylar, MD [address] [place],  [place] [address] PCP: Shirlean Mylar, MD   Assessment & Plan: Visit Diagnoses:  1. Pain in right wrist   2. Closed Colles' fracture of right radius, initial encounter     Plan: I did go over the x-rays of her right wrist with her.  She does have a fractured wrist that should do well with conservative treatment.  I am fine treating this in a Velcro wrist splint with her only removing it for hygiene purposes for the next 3 weeks.  I would like to see her back in 3 weeks with a repeat AP and lateral of the right wrist.  All question concerns were answered and addressed.  Follow-Up Instructions: Return in about 3 years (around 03/25/2024).   Orders:  Orders Placed This Encounter  Procedures   XR Wrist Complete Right   No orders of the defined types were placed in this encounter.     Procedures: No procedures performed   Clinical Data: No additional findings.   Subjective: Chief Complaint  Patient presents with   Right Wrist - Pain    Fall 03/22/2021  The patient is well-known to me.  She is a 70 year old active female who sustained mechanical fall onto her right dominant wrist and hand this past Friday.  She said she slipped on a wet floor.  She said that she was almost not going to come to the doctor but her son made her come today due to wrist pain and swelling.  She has been wearing a small wrist splint on the right wrist.  She is not taking anything for pain.  She has had no other acute change in her medical status.  HPI  Review of Systems She currently denies any headache, chest pain, shortness of breath, fever, chills, nausea, vomiting  Objective: Vital Signs: Ht 5\' 6"  (1.676 m)   Wt 125 lb (56.7 kg)   BMI 20.18 kg/m   Physical Exam She is alert and  oriented x3 and in no acute distress Ortho Exam Examination of her right wrist does show global swelling and pain with pronation and supination as well as flexion and extension.  She is neurovascularly intact. Specialty Comments:  No specialty comments available.  Imaging: XR Wrist Complete Right  Result Date: 03/25/2021 3 views of the right wrist show a minimally displaced distal radius fracture.  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    PMFS History: Patient Active Problem List   Diagnosis Date Noted   Pain in left ankle and joints of left foot 11/17/2019   Polyethylene wear of right knee prosthesis, initial encounter (HCC) 05/06/2018   Status post revision of total knee, right 05/06/2018   Bilateral primary osteoarthritis of hip 09/18/2017   History of bilateral hip arthroplasty 09/18/2017   Status post bilateral hip replacements 09/18/2017   Unilateral primary osteoarthritis, left hip 07/07/2017   Unilateral primary osteoarthritis, right hip 07/07/2017   Pain in left wrist 03/30/2017   Primary osteoarthritis of both knees 08/25/2014   Status post bilateral knee replacements 08/25/2014   Past Medical History:  Diagnosis Date   Arthritis    "qwhere;  dx'd in 1980" (05/06/2018)   Depression    Effusion of knee    Recurrent- Right   History of blood transfusion 09/2017   "related to hip OR"    History reviewed. No pertinent family history.  Past Surgical History:  Procedure Laterality Date   BILATERAL ANTERIOR TOTAL HIP ARTHROPLASTY Bilateral 09/18/2017   Procedure: BILATERAL ANTERIOR TOTAL HIP ARTHROPLASTY;  Surgeon: Kathryne Hitch, MD;  Location: [facility];  Service: Orthopedics;  Laterality: Bilateral;   DILATION AND CURETTAGE OF UTERUS     JOINT REPLACEMENT     KNEE ARTHROSCOPY Left 2009   SYNOVECTOMY WITH POLY EXCHANGE Right 05/06/2018   knee   TMJ similar surgery      TOTAL KNEE ARTHROPLASTY Bilateral 08/25/2014   Procedure: TOTAL KNEE  BILATERAL;  Surgeon: Kathryne Hitch, MD;  Location: [facility];  Service: Orthopedics;  Laterality: Bilateral;   TOTAL KNEE REVISION Right 05/06/2018   Procedure: Right knee polyexchange with synovectomy;  Surgeon: Kathryne Hitch, MD;  Location: [facility] OR;  Service: Orthopedics;  Laterality: Right;   WISDOM TOOTH EXTRACTION     Social History   Occupational History   Not on file  Tobacco Use   Smoking status: Never   Smokeless tobacco: Never  Vaping Use   Vaping Use: Never used  Substance and Sexual Activity   Alcohol use: Yes    Alcohol/week: 12.0 standard drinks    Types: 12 Glasses of wine per week    Comment: 05/06/2018 ""2 bottles/week; at least"   Drug use: Yes    Types: Marijuana    Comment: 05/06/2018 "weekends"   Sexual activity: Yes

## 2021-04-15 ENCOUNTER — Other Ambulatory Visit: Payer: Self-pay

## 2021-04-15 ENCOUNTER — Encounter: Payer: Self-pay | Admitting: Orthopaedic Surgery

## 2021-04-15 ENCOUNTER — Ambulatory Visit (INDEPENDENT_AMBULATORY_CARE_PROVIDER_SITE_OTHER): Payer: Medicare Other

## 2021-04-15 ENCOUNTER — Ambulatory Visit (INDEPENDENT_AMBULATORY_CARE_PROVIDER_SITE_OTHER): Payer: Medicare Other | Admitting: Orthopaedic Surgery

## 2021-04-15 DIAGNOSIS — S52531A Colles' fracture of right radius, initial encounter for closed fracture: Secondary | ICD-10-CM

## 2021-04-15 DIAGNOSIS — S52531D Colles' fracture of right radius, subsequent encounter for closed fracture with routine healing: Secondary | ICD-10-CM

## 2021-04-15 NOTE — Progress Notes (Signed)
The patient is an active 70 year old right-hand-dominant female who is 3 weeks into a right distal radius fracture that is being treated in just a removable splint.  She said that she is still sore but improving overall.  She is taking etodolac as needed for pain and inflammation.  Clinically assessing her right wrist, there is no malalignment.  She has actually full range of motion of the wrist with only some mild discomfort.  2 views of the right wrist show a minimally displaced distal radius fracture in neutral alignment.  She will slowly continue to increase her activities as comfort allows.  She will continue coming in and out of the wrist splint as comfort allows.  We will see her back for a visit in 4 weeks with a repeat 2 views of the right wrist.

## 2021-04-16 DIAGNOSIS — Z5181 Encounter for therapeutic drug level monitoring: Secondary | ICD-10-CM | POA: Diagnosis not present

## 2021-04-16 DIAGNOSIS — E785 Hyperlipidemia, unspecified: Secondary | ICD-10-CM | POA: Diagnosis not present

## 2021-04-23 DIAGNOSIS — Z0000 Encounter for general adult medical examination without abnormal findings: Secondary | ICD-10-CM | POA: Diagnosis not present

## 2021-04-23 DIAGNOSIS — F331 Major depressive disorder, recurrent, moderate: Secondary | ICD-10-CM | POA: Diagnosis not present

## 2021-05-14 ENCOUNTER — Ambulatory Visit (INDEPENDENT_AMBULATORY_CARE_PROVIDER_SITE_OTHER): Payer: Medicare Other | Admitting: Orthopaedic Surgery

## 2021-05-14 ENCOUNTER — Ambulatory Visit (INDEPENDENT_AMBULATORY_CARE_PROVIDER_SITE_OTHER): Payer: Medicare Other

## 2021-05-14 ENCOUNTER — Encounter: Payer: Self-pay | Admitting: Orthopaedic Surgery

## 2021-05-14 DIAGNOSIS — S52531D Colles' fracture of right radius, subsequent encounter for closed fracture with routine healing: Secondary | ICD-10-CM

## 2021-05-14 DIAGNOSIS — S52531A Colles' fracture of right radius, initial encounter for closed fracture: Secondary | ICD-10-CM

## 2021-05-14 NOTE — Progress Notes (Signed)
The patient is a 70 year old female who is now about 8 weeks into a mechanical fall in which she sustained almost a nondisplaced right distal radius fracture.  We have been treating her just a removable wrist splint now she is not wearing the splint.  She reports good motion but decreased strength of the right wrist.  She does get a burning sensation at night.  Overall though she says she is doing much better.  Examination of her right wrist shows no bruising.  Her range of motion is entirely full with some pain to be expected.  She has pretty good grip and pinch strength on the right side as well.  Her fingers are well-perfused.  2 views of the right wrist are obtained and reviewed the previous films.  The fracture line shows healing of the extra-articular fracture of the distal radius.  There is no significant shortening or angulation and there is definitely been interval healing.  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  At this point I gave her reassurance that the pain she is experiencing is normal and it can take 3 to 6 months for that to completely go away.  She is not needing splinting at this standpoint and needs to be careful with activities with the right wrist.  At this point follow-up can be as needed and if things are not improving she knows to give [place] a call.  She is a long-term patient of ours.

## 2021-05-21 DIAGNOSIS — Z23 Encounter for immunization: Secondary | ICD-10-CM | POA: Diagnosis not present

## 2021-10-01 DIAGNOSIS — M1990 Unspecified osteoarthritis, unspecified site: Secondary | ICD-10-CM | POA: Diagnosis not present

## 2021-10-01 DIAGNOSIS — F900 Attention-deficit hyperactivity disorder, predominantly inattentive type: Secondary | ICD-10-CM | POA: Diagnosis not present

## 2021-10-01 DIAGNOSIS — F331 Major depressive disorder, recurrent, moderate: Secondary | ICD-10-CM | POA: Diagnosis not present

## 2021-10-20 DIAGNOSIS — Z20822 Contact with and (suspected) exposure to covid-19: Secondary | ICD-10-CM | POA: Diagnosis not present

## 2021-12-30 DIAGNOSIS — Z20822 Contact with and (suspected) exposure to covid-19: Secondary | ICD-10-CM | POA: Diagnosis not present

## 2022-01-07 DIAGNOSIS — H40023 Open angle with borderline findings, high risk, bilateral: Secondary | ICD-10-CM | POA: Diagnosis not present

## 2022-01-07 DIAGNOSIS — H35371 Puckering of macula, right eye: Secondary | ICD-10-CM | POA: Diagnosis not present

## 2022-01-12 DIAGNOSIS — Z20822 Contact with and (suspected) exposure to covid-19: Secondary | ICD-10-CM | POA: Diagnosis not present

## 2022-05-20 DIAGNOSIS — Z23 Encounter for immunization: Secondary | ICD-10-CM | POA: Diagnosis not present

## 2022-05-20 DIAGNOSIS — R232 Flushing: Secondary | ICD-10-CM | POA: Diagnosis not present

## 2022-05-20 DIAGNOSIS — E785 Hyperlipidemia, unspecified: Secondary | ICD-10-CM | POA: Diagnosis not present

## 2022-05-20 DIAGNOSIS — Z1159 Encounter for screening for other viral diseases: Secondary | ICD-10-CM | POA: Diagnosis not present

## 2022-05-20 DIAGNOSIS — Z1211 Encounter for screening for malignant neoplasm of colon: Secondary | ICD-10-CM | POA: Diagnosis not present

## 2022-05-20 DIAGNOSIS — F900 Attention-deficit hyperactivity disorder, predominantly inattentive type: Secondary | ICD-10-CM | POA: Diagnosis not present

## 2022-05-20 DIAGNOSIS — M6248 Contracture of muscle, other site: Secondary | ICD-10-CM | POA: Diagnosis not present

## 2022-05-20 DIAGNOSIS — R739 Hyperglycemia, unspecified: Secondary | ICD-10-CM | POA: Diagnosis not present

## 2022-05-20 DIAGNOSIS — E2839 Other primary ovarian failure: Secondary | ICD-10-CM | POA: Diagnosis not present

## 2022-05-20 DIAGNOSIS — Z0000 Encounter for general adult medical examination without abnormal findings: Secondary | ICD-10-CM | POA: Diagnosis not present

## 2022-06-09 DIAGNOSIS — Z23 Encounter for immunization: Secondary | ICD-10-CM | POA: Diagnosis not present

## 2022-11-19 DIAGNOSIS — E2839 Other primary ovarian failure: Secondary | ICD-10-CM | POA: Diagnosis not present

## 2022-11-19 DIAGNOSIS — F900 Attention-deficit hyperactivity disorder, predominantly inattentive type: Secondary | ICD-10-CM | POA: Diagnosis not present

## 2022-11-19 DIAGNOSIS — M1990 Unspecified osteoarthritis, unspecified site: Secondary | ICD-10-CM | POA: Diagnosis not present

## 2022-11-19 DIAGNOSIS — Z0184 Encounter for antibody response examination: Secondary | ICD-10-CM | POA: Diagnosis not present

## 2022-11-26 DIAGNOSIS — M85832 Other specified disorders of bone density and structure, left forearm: Secondary | ICD-10-CM | POA: Diagnosis not present

## 2023-02-18 ENCOUNTER — Ambulatory Visit (HOSPITAL_BASED_OUTPATIENT_CLINIC_OR_DEPARTMENT_OTHER): Payer: Medicare Other | Admitting: Student

## 2023-02-19 ENCOUNTER — Encounter (HOSPITAL_BASED_OUTPATIENT_CLINIC_OR_DEPARTMENT_OTHER): Payer: Self-pay | Admitting: Student

## 2023-02-19 ENCOUNTER — Ambulatory Visit (INDEPENDENT_AMBULATORY_CARE_PROVIDER_SITE_OTHER): Payer: Medicare Other | Admitting: Student

## 2023-02-19 ENCOUNTER — Ambulatory Visit (HOSPITAL_BASED_OUTPATIENT_CLINIC_OR_DEPARTMENT_OTHER): Payer: Medicare Other

## 2023-02-19 DIAGNOSIS — Z96651 Presence of right artificial knee joint: Secondary | ICD-10-CM

## 2023-02-19 NOTE — Progress Notes (Signed)
Chief Complaint: Right knee pain     History of Present Illness:    Becky Cunningham is a 72 y.o. female with history of bilateral TKA and poly exchange coming in the clinic today for evaluation of right knee pain.  Patient states that this began about 2 days ago without any injury or known cause.  Pain levels range from mild to moderate and are worsened with going up stairs as well as knee flexion.  She does note that her knee has felt swollen and warm, however this was worse the past 2 days.  Patient takes etodolac for widespread osteoarthritis throughout her joints.  Denies any fever or chills.   Surgical History:   Bilateral THA - 2019 Bilateral TKA - 2015  PMH/PSH/Family History/Social History/Meds/Allergies:    Past Medical History:  Diagnosis Date   Arthritis    "qwhere; dx'd in 1980" (05/06/2018)   Depression    Effusion of knee    Recurrent- Right   History of blood transfusion 09/2017   "related to hip OR"   Past Surgical History:  Procedure Laterality Date   BILATERAL ANTERIOR TOTAL HIP ARTHROPLASTY Bilateral 09/18/2017   Procedure: BILATERAL ANTERIOR TOTAL HIP ARTHROPLASTY;  Surgeon: Kathryne Hitch, MD;  Location: [facility];  Service: Orthopedics;  Laterality: Bilateral;   DILATION AND CURETTAGE OF UTERUS     JOINT REPLACEMENT     KNEE ARTHROSCOPY Left 2009   SYNOVECTOMY WITH POLY EXCHANGE Right 05/06/2018   knee   TMJ similar surgery      TOTAL KNEE ARTHROPLASTY Bilateral 08/25/2014   Procedure: TOTAL KNEE BILATERAL;  Surgeon: Kathryne Hitch, MD;  Location: [facility];  Service: Orthopedics;  Laterality: Bilateral;   TOTAL KNEE REVISION Right 05/06/2018   Procedure: Right knee polyexchange with synovectomy;  Surgeon: Kathryne Hitch, MD;  Location: [facility] OR;  Service: Orthopedics;  Laterality: Right;   WISDOM TOOTH EXTRACTION     Social History   Socioeconomic History   Marital status: Married    Spouse name: Not  on file   Number of children: Not on file   Years of education: Not on file   Highest education level: Not on file  Occupational History   Not on file  Tobacco Use   Smoking status: Never   Smokeless tobacco: Never  Vaping Use   Vaping Use: Never used  Substance and Sexual Activity   Alcohol use: Yes    Alcohol/week: 12.0 standard drinks of alcohol    Types: 12 Glasses of wine per week    Comment: 05/06/2018 ""2 bottles/week; at least"   Drug use: Yes    Types: Marijuana    Comment: 05/06/2018 "weekends"   Sexual activity: Yes  Other Topics Concern   Not on file  Social History Narrative   Not on file   Social Determinants of Health   Financial Resource Strain: Not on file  Food Insecurity: Not on file  Transportation Needs: Not on file  Physical Activity: Not on file  Stress: Not on file  Social Connections: Not on file   History reviewed. No pertinent family history. Allergies  Allergen Reactions   Other Swelling and Other (See Comments)    MELONS >> Sugar enzymes--Throat swelling/scratchy. (when eats something really sweet) with melons    Current Outpatient Medications  Medication Sig  Dispense Refill   aspirin 81 MG chewable tablet Chew 1 tablet (81 mg total) by mouth 2 (two) times daily. 30 tablet 0   busPIRone (BUSPAR) 10 MG tablet Take 10 mg by mouth 2 (two) times daily.     Calcium Carb-Cholecalciferol (CALCIUM 600 + D PO) Take 1 tablet by mouth 2 (two) times daily.     DIGESTIVE ENZYMES PO Take 1 capsule by mouth daily. Probiotic Digestive Enzyme.     etodolac (LODINE) 500 MG tablet TK 1 T PO BID WF  3   ferrous sulfate 325 (65 FE) MG tablet Take 1 tablet (325 mg total) by mouth 3 (three) times daily with meals. (Patient taking differently: Take 325 mg by mouth daily.) 60 tablet 0   Magnesium 500 MG TABS Take 500 mg by mouth daily.     methylphenidate (RITALIN) 20 MG tablet TK 1 T PO BID OES PRN  0   Probiotic Product (PROBIOTIC PO) Take 1 scoop by mouth  daily. Probiotic Shake--"Natural Vitality"     venlafaxine XR (EFFEXOR-XR) 150 MG 24 hr capsule Take 150 mg by mouth daily.     vitamin C (ASCORBIC ACID) 500 MG tablet Take 500 mg by mouth 3 (three) times daily.      No current facility-administered medications for this visit.   No results found.  Review of Systems:   A ROS was performed including pertinent positives and negatives as documented in the HPI.  Physical Exam :   Constitutional: NAD and appears stated age Neurological: Alert and oriented Psych: Appropriate affect and cooperative There were no vitals taken for this visit.   Comprehensive Musculoskeletal Exam:    No significant tenderness palpation of the right knee.  There is a mild effusion with some warmth noted.  Knee TKA components feel stable on exam without laxity with varus or valgus stress.  No significant erythema.  Imaging:   Xray (Left knee 2 views): Periprosthetic lucency noted on medial aspect of femoral component.   I personally reviewed and interpreted the radiographs.   Assessment:   72 y.o. female with history of TKA and poly exchange experiencing atraumatic pain and swelling of the right knee.  Patient does have a mild effusion on the knee however is not significant at this time and I do not believe it warrants aspiration for analysis and/or symptomatic relief.  I would like to obtain some inflammatory labs to rule out developing infection.  Discussed with patient that I will want to have her follow-up with Dr. Magnus Ivan or his PA Bronson Curb for further evaluation and to rule out any loosening of the TKA components if needed.  All other questions addressed at this time.  Plan :    - Obtain ESR/CRP and follow up with Dr Eliberto Ivory office for further workup     I personally saw and evaluated the patient, and participated in the management and treatment plan.  Hazle Nordmann, PA-C Orthopedics  This document was dictated using Manufacturing engineer. A reasonable attempt at proof reading has been made to minimize errors.

## 2023-03-05 ENCOUNTER — Ambulatory Visit (INDEPENDENT_AMBULATORY_CARE_PROVIDER_SITE_OTHER): Payer: Medicare Other | Admitting: Physician Assistant

## 2023-03-05 ENCOUNTER — Encounter: Payer: Self-pay | Admitting: Physician Assistant

## 2023-03-05 DIAGNOSIS — Z96651 Presence of right artificial knee joint: Secondary | ICD-10-CM | POA: Diagnosis not present

## 2023-03-05 DIAGNOSIS — M25461 Effusion, right knee: Secondary | ICD-10-CM | POA: Diagnosis not present

## 2023-03-05 MED ORDER — LIDOCAINE HCL 1 % IJ SOLN
3.0000 mL | INTRAMUSCULAR | Status: AC | PRN
  Administered 2023-03-05: 3 mL

## 2023-03-05 NOTE — Progress Notes (Addendum)
Office Visit Note   Patient: Becky Cunningham           Date of Birth: February 10, 1951           MRN: [id number] Visit Date: 03/05/2023              Requested by: Shirlean Mylar, MD [address] [place],  [place] [address] PCP: Shirlean Mylar, MD   Assessment & Plan: Visit Diagnoses:  1. Status post total right knee replacement   2. Status post revision of total knee, right   3. Effusion, right knee     Plan: Will send her to formal physical therapy to work on strengthening of the right knee.  She will follow-up with [place] in 4 weeks see how she is doing overall.  Did not send the aspirate as it was completely bloody and also given her sed and CRP right.  Past cultures have been negative for infection.  Follow-Up Instructions: Return in about 4 weeks (around 04/02/2023).   Orders:  Orders Placed This Encounter  Procedures   Large Joint Inj: R knee   No orders of the defined types were placed in this encounter.     Procedures: Large Joint Inj: R knee on 03/05/2023 6:41 PM Indications: pain Details: 22 G 1.5 in needle, superolateral approach  Arthrogram: No  Medications: 3 mL lidocaine 1 % Aspirate: 70 mL bloody Outcome: tolerated well, no immediate complications Procedure, treatment alternatives, risks and benefits explained, specific risks discussed. Consent was given by the patient. Immediately prior to procedure a time out was called to verify the correct patient, procedure, equipment, support staff and site/side marked as required. Patient was prepped and draped in the usual sterile fashion.       Clinical Data: No additional findings.   Subjective: Chief Complaint  Patient presents with   Right Knee - Follow-up    HPI [name] returns today in follow-up of her right knee.  She was recently seen at [facility] for right knee swelling and pain.  Note states that she had slight effusion.  Sed rate and CRP were sent to rule out infection sed rate came back  it to millimeters per hour and C-reactive protein came back at 2 mg/L.  She has had no fevers or chills.  She has had a prior history of recurrent effusions those aspirations were negative for infection.  More bloody.  She is on no blood thinners at this point in time including aspirin.  She was taken to the operating room on 8/29 /19 due to the recurrent effusion and underwent right knee irrigation debridement with partial synovectomy and upsizing the polyethylene liner.  She has had a left total knee arthroplasty which she has no complaints about.  She ranks her right knee pain is 2 out of 10 pain with going up and down stairs.  Pain began approximately 02/17/2023.  She had taken a total of that for widespread arthritis throughout her joints.  But no particular injury to the knee. Radiographs right knee 2 views dated 02/25/2023 were reviewed and show no acute fractures no acute findings.  Right total knee arthroplasty components well-seated.  Knee is well located. Review of Systems See HPI otherwise negative  Objective: Vital Signs: There were no vitals taken for this visit.  Physical Exam General: Pleasant female no acute distress mood affect appropriate.  Ambulates without any assistive device and antalgic gait. Ortho Exam Bilateral knees: Well-healed surgical incisions no abnormal warmth erythema of  either knee.  Effusion of the right knee only.  No instability valgus varus stressing of either knee.  Anterior drawer is negative bilaterally.  Lachman's is negative bilaterally. Specialty Comments:  No specialty comments available.  Imaging: No results found.   PMFS History: Patient Active Problem List   Diagnosis Date Noted   Pain in left ankle and joints of left foot 11/17/2019   Polyethylene wear of right knee prosthesis, initial encounter (HCC) 05/06/2018   Status post revision of total knee, right 05/06/2018   Bilateral primary osteoarthritis of hip 09/18/2017   History of bilateral  hip arthroplasty 09/18/2017   Status post bilateral hip replacements 09/18/2017   Unilateral primary osteoarthritis, left hip 07/07/2017   Unilateral primary osteoarthritis, right hip 07/07/2017   Pain in left wrist 03/30/2017   Primary osteoarthritis of both knees 08/25/2014   Status post bilateral knee replacements 08/25/2014   Past Medical History:  Diagnosis Date   Arthritis    "qwhere; dx'd in 1980" (05/06/2018)   Depression    Effusion of knee    Recurrent- Right   History of blood transfusion 09/2017   "related to hip OR"    History reviewed. No pertinent family history.  Past Surgical History:  Procedure Laterality Date   BILATERAL ANTERIOR TOTAL HIP ARTHROPLASTY Bilateral 09/18/2017   Procedure: BILATERAL ANTERIOR TOTAL HIP ARTHROPLASTY;  Surgeon: Kathryne Hitch, MD;  Location: [facility];  Service: Orthopedics;  Laterality: Bilateral;   DILATION AND CURETTAGE OF UTERUS     JOINT REPLACEMENT     KNEE ARTHROSCOPY Left 2009   SYNOVECTOMY WITH POLY EXCHANGE Right 05/06/2018   knee   TMJ similar surgery      TOTAL KNEE ARTHROPLASTY Bilateral 08/25/2014   Procedure: TOTAL KNEE BILATERAL;  Surgeon: Kathryne Hitch, MD;  Location: [facility];  Service: Orthopedics;  Laterality: Bilateral;   TOTAL KNEE REVISION Right 05/06/2018   Procedure: Right knee polyexchange with synovectomy;  Surgeon: Kathryne Hitch, MD;  Location: [facility] OR;  Service: Orthopedics;  Laterality: Right;   WISDOM TOOTH EXTRACTION     Social History   Occupational History   Not on file  Tobacco Use   Smoking status: Never   Smokeless tobacco: Never  Vaping Use   Vaping Use: Never used  Substance and Sexual Activity   Alcohol use: Yes    Alcohol/week: 12.0 standard drinks of alcohol    Types: 12 Glasses of wine per week    Comment: 05/06/2018 ""2 bottles/week; at least"   Drug use: Yes    Types: Marijuana    Comment: 05/06/2018 "weekends"   Sexual activity: Yes

## 2023-03-06 DIAGNOSIS — H524 Presbyopia: Secondary | ICD-10-CM | POA: Diagnosis not present

## 2023-03-06 DIAGNOSIS — H52223 Regular astigmatism, bilateral: Secondary | ICD-10-CM | POA: Diagnosis not present

## 2023-03-06 DIAGNOSIS — H5203 Hypermetropia, bilateral: Secondary | ICD-10-CM | POA: Diagnosis not present

## 2023-03-30 ENCOUNTER — Encounter: Payer: Self-pay | Admitting: Physician Assistant

## 2023-03-30 ENCOUNTER — Ambulatory Visit (INDEPENDENT_AMBULATORY_CARE_PROVIDER_SITE_OTHER): Payer: Medicare Other | Admitting: Physician Assistant

## 2023-03-30 VITALS — Ht 66.5 in | Wt 128.0 lb

## 2023-03-30 DIAGNOSIS — Z96651 Presence of right artificial knee joint: Secondary | ICD-10-CM

## 2023-03-30 NOTE — Progress Notes (Signed)
HPI: Becky Cunningham comes in today status post right knee aspiration injection 03/05/2023.  She states overall she is doing well.  She has had no new injury to the knee.  Having no mechanical symptoms.  She does not feel like she has any swelling in the knee at this point in time.  She however did not get to therapy.  No one contacted her about therapy.  Physical exam: General well-developed well-nourished female who ambulates without any assistive device. Bilateral knees: Good range of motion of both knees.  No instability valgus varus stressing of either knee.  Slight anterior drawer right knee only.  Right knee no abnormal warmth erythema.  Slight effusion.  Plan: At this point time we will set her up for therapy at our office to work on range of motion and strengthening of the knees.  She will get a home exercise program like to see her back in 6 weeks see how she is doing overall.  Did offer aspiration today she defers.  Questions were encouraged and answered at length.

## 2023-04-08 ENCOUNTER — Ambulatory Visit: Payer: Medicare Other | Admitting: Rehabilitative and Restorative Service Providers"

## 2023-04-13 ENCOUNTER — Ambulatory Visit (INDEPENDENT_AMBULATORY_CARE_PROVIDER_SITE_OTHER): Payer: Medicare Other | Admitting: Rehabilitative and Restorative Service Providers"

## 2023-04-13 ENCOUNTER — Encounter: Payer: Self-pay | Admitting: Rehabilitative and Restorative Service Providers"

## 2023-04-13 DIAGNOSIS — M6281 Muscle weakness (generalized): Secondary | ICD-10-CM | POA: Diagnosis not present

## 2023-04-13 DIAGNOSIS — R600 Localized edema: Secondary | ICD-10-CM

## 2023-04-13 NOTE — Progress Notes (Signed)
OUTPATIENT PHYSICAL THERAPY LOWER EXTREMITY EVALUATION   Patient Name: Becky Cunningham MRN: [id number] DOB:1951/06/12, 72 y.o., female Today's Date: 04/13/2023  END OF SESSION:  PT End of Session - 04/13/23 1429     Visit Number 1    Number of Visits 8    Date for PT Re-Evaluation 06/08/23    Authorization Type Medicare    Authorization - Visit Number 1    Authorization - Number of Visits 8    Progress Note Due on Visit 8    PT Start Time 1345    PT Stop Time 1425    PT Time Calculation (min) 40 min    Activity Tolerance Patient tolerated treatment well;No increased pain    Behavior During Therapy WFL for tasks assessed/performed             Past Medical History:  Diagnosis Date   Arthritis    "qwhere; dx'd in 1980" (05/06/2018)   Depression    Effusion of knee    Recurrent- Right   History of blood transfusion 09/2017   "related to hip OR"   Past Surgical History:  Procedure Laterality Date   BILATERAL ANTERIOR TOTAL HIP ARTHROPLASTY Bilateral 09/18/2017   Procedure: BILATERAL ANTERIOR TOTAL HIP ARTHROPLASTY;  Surgeon: Kathryne Hitch, MD;  Location: [facility];  Service: Orthopedics;  Laterality: Bilateral;   DILATION AND CURETTAGE OF UTERUS     JOINT REPLACEMENT     KNEE ARTHROSCOPY Left 2009   SYNOVECTOMY WITH POLY EXCHANGE Right 05/06/2018   knee   TMJ similar surgery      TOTAL KNEE ARTHROPLASTY Bilateral 08/25/2014   Procedure: TOTAL KNEE BILATERAL;  Surgeon: Kathryne Hitch, MD;  Location: [facility];  Service: Orthopedics;  Laterality: Bilateral;   TOTAL KNEE REVISION Right 05/06/2018   Procedure: Right knee polyexchange with synovectomy;  Surgeon: Kathryne Hitch, MD;  Location: [facility] OR;  Service: Orthopedics;  Laterality: Right;   WISDOM TOOTH EXTRACTION     Patient Active Problem List   Diagnosis Date Noted   Pain in left ankle and joints of left foot 11/17/2019   Polyethylene wear of right knee prosthesis, initial encounter (HCC)  05/06/2018   Status post revision of total knee, right 05/06/2018   Bilateral primary osteoarthritis of hip 09/18/2017   History of bilateral hip arthroplasty 09/18/2017   Status post bilateral hip replacements 09/18/2017   Unilateral primary osteoarthritis, left hip 07/07/2017   Unilateral primary osteoarthritis, right hip 07/07/2017   Pain in left wrist 03/30/2017   Primary osteoarthritis of both knees 08/25/2014   Status post bilateral knee replacements 08/25/2014    PCP: Shirlean Mylar, MD  REFERRING PROVIDER: Kirtland Bouchard, PA-C  REFERRING DIAG: [phone] (ICD-10-CM) - Status post total right knee replacement  THERAPY DIAG:  Muscle weakness (generalized) - Plan: PT plan of care cert/re-cert  Localized edema - Plan: PT plan of care cert/re-cert  Rationale for Evaluation and Treatment: Rehabilitation  ONSET DATE: Chronic  SUBJECTIVE:   SUBJECTIVE STATEMENT: Falyn keeps getting fluid drained from her right knee.    PERTINENT HISTORY: Arthritis, depression, bilateral anterior THAs, bilateral knee replacements 2015, right knee revision 2019 PAIN:  Are you having pain? No  PRECAUTIONS: None  RED FLAGS: None   WEIGHT BEARING RESTRICTIONS: No  FALLS:  Has patient fallen in last 6 months? 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 Uncomplicated right total knee prosthesis. 2. Small knee joint effusion.  PATIENT SURVEYS:  FOTO 78 (risk-adjusted 48, Goal 82 in 11 visits)  COGNITION: Overall cognitive status: Within functional limits for tasks  assessed     SENSATION: Natilie notes intermittent right forearm burning and bilateral middle finger numbness.  No lower extremity paresthesias were noted .  EDEMA:  Lashanda notes she has had her right knee drained several times including recently by Rexene Edison, PA-C.     LOWER EXTREMITY ROM:  Active ROM Left/Right 04/13/2023   Hip flexion    Hip extension    Hip abduction    Hip adduction    Hip internal rotation    Hip external rotation    Knee flexion 133/132   Knee extension 0/0   Ankle dorsiflexion    Ankle plantarflexion    Ankle inversion    Ankle eversion     (Blank rows = not tested)  LOWER EXTREMITY STRENGTH:  In pounds assessed with hand-held dynamometer Left/Right 04/13/2023   Hip flexion    Hip extension    Hip abduction    Hip adduction    Hip internal rotation    Hip external rotation    Knee flexion    Knee extension 51.0/54.6   Ankle dorsiflexion    Ankle plantarflexion    Ankle inversion    Ankle eversion     (Blank rows = not tested)   GAIT: Distance walked: 150 feet Assistive device utilized: None Level of assistance: Complete Independence Comments: NA   TODAY'S TREATMENT:                                                                                                                              DATE: 04/13/2023 Quadriceps sets 10 x 5 seconds bilateral Seated straight leg raises 2 sets of 5 for 3 seconds  Reviewed home exercise program and prognosis  PATIENT EDUCATION:  Education details: See above Person educated: Patient Education method: Explanation, Demonstration, Tactile cues, Verbal cues, and Handouts Education comprehension: verbalized understanding, returned demonstration, verbal cues required, tactile cues required, and needs further education  HOME EXERCISE PROGRAM: [id number]  ASSESSMENT:  CLINICAL IMPRESSION: Patient is a 72 y.o. female who was seen today for physical therapy evaluation and treatment for s/p right knee revision.   Destyni had her right knee replacement revised in 2019 due to a poorly fitting component.  She notes that she has had her knee drained several times since then.  She would like to be able to walk her dog and do her normal work activities without being limited by right knee edema.  The only significant clinical finding noted during today's evaluation  was bilateral quadriceps weakness and we started addressing this during today's visit.  OBJECTIVE IMPAIRMENTS: decreased knowledge of condition, decreased strength, and increased edema.   ACTIVITY LIMITATIONS:  Limits ability to walk dog and complete all work activities when edema is present.  PARTICIPATION LIMITATIONS: community activity and occupation  PERSONAL FACTORS: Arthritis, depression, bilateral anterior THAs, bilateral knee replacements 2015, right knee revision 2019 are also affecting patient's functional outcome.   REHAB POTENTIAL: Good  CLINICAL DECISION MAKING: Stable/uncomplicated  EVALUATION COMPLEXITY: Low   GOALS: Goals reviewed with patient? Yes  SHORT TERM GOALS: Target date: 05/11/2023 Telitha will be independent with her day 1 HEP Baseline: Started 04/13/2023 Goal status: INITIAL  2.  Improve bilateral quadricep strength to at least 60 pounds Baseline: 51.0/54.6 pounds Goal status: INITIAL   LONG TERM GOALS: Target date: 06/08/2023  Improve FOTO to 82 Baseline: 78, risk-adjusted 48 Goal status: INITIAL  2.  Improve bilateral quadriceps strength to 70+ pounds Baseline: 51.0/54.6 pounds Goal status: INITIAL  3.  Nghi will be independent with her long-term maintenance HEP at [place] Baseline: Started 04/13/2023 Goal status: INITIAL   PLAN:  PT FREQUENCY: 1x/week  PT DURATION: 8 weeks  PLANNED INTERVENTIONS: Therapeutic exercises, Therapeutic activity, Neuromuscular re-education, Balance training, Gait training, Patient/Family education, Stair training, Cryotherapy, and Vasopneumatic device  PLAN FOR NEXT SESSION:  Review HEP.  Be conservative with adding to HEP due to compliance.  OK to progress some basic gym activities (bike, leg press).   Cherlyn Cushing, PT, MPT 04/13/2023, 2:46 PM

## 2023-04-22 ENCOUNTER — Encounter: Payer: Medicare Other | Admitting: Rehabilitative and Restorative Service Providers"

## 2023-04-24 ENCOUNTER — Ambulatory Visit (INDEPENDENT_AMBULATORY_CARE_PROVIDER_SITE_OTHER): Payer: Medicare Other | Admitting: Rehabilitative and Restorative Service Providers"

## 2023-04-24 ENCOUNTER — Encounter: Payer: Self-pay | Admitting: Rehabilitative and Restorative Service Providers"

## 2023-04-24 DIAGNOSIS — M6281 Muscle weakness (generalized): Secondary | ICD-10-CM

## 2023-04-24 DIAGNOSIS — R600 Localized edema: Secondary | ICD-10-CM | POA: Diagnosis not present

## 2023-04-24 NOTE — Therapy (Signed)
OUTPATIENT PHYSICAL THERAPY LOWER EXTREMITY TREATMENT     Patient Name: Becky Cunningham MRN: [id number] DOB:March 23, 1951, 72 y.o., female Today's Date: 04/13/2023   END OF SESSION:   PT End of Session - 04/13/23 1429       Visit Number 1     Number of Visits 8     Date for PT Re-Evaluation 06/08/23     Authorization Type Medicare     Authorization - Visit Number 1     Authorization - Number of Visits 8     Progress Note Due on Visit 8     PT Start Time 1345     PT Stop Time 1425     PT Time Calculation (min) 40 min     Activity Tolerance Patient tolerated treatment well;No increased pain     Behavior During Therapy WFL for tasks assessed/performed                       Past Medical History:  Diagnosis Date   Arthritis      "qwhere; dx'd in 1980" (05/06/2018)   Depression     Effusion of knee      Recurrent- Right   History of blood transfusion 09/2017    "related to hip OR"             Past Surgical History:  Procedure Laterality Date   BILATERAL ANTERIOR TOTAL HIP ARTHROPLASTY Bilateral 09/18/2017    Procedure: BILATERAL ANTERIOR TOTAL HIP ARTHROPLASTY;  Surgeon: Kathryne Hitch, MD;  Location: [facility];  Service: Orthopedics;  Laterality: Bilateral;   DILATION AND CURETTAGE OF UTERUS       JOINT REPLACEMENT       KNEE ARTHROSCOPY Left 2009   SYNOVECTOMY WITH POLY EXCHANGE Right 05/06/2018    knee   TMJ similar surgery        TOTAL KNEE ARTHROPLASTY Bilateral 08/25/2014    Procedure: TOTAL KNEE BILATERAL;  Surgeon: Kathryne Hitch, MD;  Location: [facility];  Service: Orthopedics;  Laterality: Bilateral;   TOTAL KNEE REVISION Right 05/06/2018    Procedure: Right knee polyexchange with synovectomy;  Surgeon: Kathryne Hitch, MD;  Location: [facility] OR;  Service: Orthopedics;  Laterality: Right;   WISDOM TOOTH EXTRACTION                Patient Active Problem List    Diagnosis Date Noted   Pain in left ankle and joints of left foot 11/17/2019    Polyethylene wear of right knee prosthesis, initial encounter (HCC) 05/06/2018   Status post revision of total knee, right 05/06/2018   Bilateral primary osteoarthritis of hip 09/18/2017   History of bilateral hip arthroplasty 09/18/2017   Status post bilateral hip replacements 09/18/2017   Unilateral primary osteoarthritis, left hip 07/07/2017   Unilateral primary osteoarthritis, right hip 07/07/2017   Pain in left wrist 03/30/2017   Primary osteoarthritis of both knees 08/25/2014   Status post bilateral knee replacements 08/25/2014      PCP: Shirlean Mylar, MD   REFERRING PROVIDER: Kirtland Bouchard, PA-C   REFERRING DIAG: [phone] (ICD-10-CM) - Status post total right knee replacement   THERAPY DIAG:  Muscle weakness (generalized) - Plan: PT plan of care cert/re-cert   Localized edema - Plan: PT plan of care cert/re-cert   Rationale for Evaluation and Treatment: Rehabilitation   ONSET DATE: Chronic   SUBJECTIVE:    SUBJECTIVE STATEMENT: Becky Cunningham reports "every other day" HEP compliance.  No fluid has been  drained from her right knee since evaluation.     PERTINENT HISTORY: Arthritis, depression, bilateral anterior THAs, bilateral knee replacements 2015, right knee revision 2019 PAIN:  Are you having pain? No   PRECAUTIONS: None   RED FLAGS: None      WEIGHT BEARING RESTRICTIONS: No   FALLS:  Has patient fallen in last 6 months? 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 Uncomplicated right total knee prosthesis. 2. Small knee joint effusion.   PATIENT  SURVEYS:  FOTO 78 (risk-adjusted 48, Goal 82 in 11 visits)   COGNITION: Overall cognitive status: Within functional limits for tasks assessed                         SENSATION: Becky Cunningham notes intermittent right forearm burning and bilateral middle finger numbness.  No lower extremity paresthesias were noted .   EDEMA:  Becky Cunningham notes she has had her right knee drained several times including recently by Rexene Edison, PA-C.       LOWER EXTREMITY ROM:   Active ROM Left/Right 04/13/2023    Hip flexion      Hip extension      Hip abduction      Hip adduction      Hip internal rotation      Hip external rotation      Knee flexion 133/132    Knee extension 0/0    Ankle dorsiflexion      Ankle plantarflexion      Ankle inversion      Ankle eversion       (Blank rows = not tested)   LOWER EXTREMITY STRENGTH:   In pounds assessed with hand-held dynamometer Left/Right 04/13/2023    Hip flexion      Hip extension      Hip abduction      Hip adduction      Hip internal rotation      Hip external rotation      Knee flexion      Knee extension 51.0/54.6    Ankle dorsiflexion      Ankle plantarflexion      Ankle inversion      Ankle eversion       (Blank rows = not tested)     GAIT: Distance walked: 150 feet Assistive device utilized: None Level of assistance: Complete Independence Comments: NA     TODAY'S TREATMENT:  DATE:  04/24/2023 Recumbent bike Seat 6 for 5 minutes Level 3 Seated straight leg raises 4 sets of 5 with 1# Quadriceps sets 2 sets of 10 for 5 seconds  Functional Activities: Double Leg Press 75# 10 x slow eccentrics Single Leg Press 37# 10 x slow eccentrics bilateral  Neuromuscular re-education: Tandem balance: eyes open; head turning and eyes closed 3 x each 20 seconds   04/13/2023 Quadriceps sets 10 x 5 seconds bilateral Seated straight  leg raises 2 sets of 5 for 3 seconds   Reviewed home exercise program and prognosis   PATIENT EDUCATION:  Education details: See above Person educated: Patient Education method: Explanation, Demonstration, Tactile cues, Verbal cues, and Handouts Education comprehension: verbalized understanding, returned demonstration, verbal cues required, tactile cues required, and needs further education   HOME EXERCISE PROGRAM: [id number]   ASSESSMENT:   CLINICAL IMPRESSION: Improving quadriceps strength, weight-bearing endurance and controlling edema remain the focus with Darolyn's supervised PT.  We progressed a few strength and functional activities today and she was encouraged to increase HEP compliance.  Continue current plan to meet long-term goals.    OBJECTIVE IMPAIRMENTS: decreased knowledge of condition, decreased strength, and increased edema.    ACTIVITY LIMITATIONS:  Limits ability to walk dog and complete all work activities when edema is present.   PARTICIPATION LIMITATIONS: community activity and occupation   PERSONAL FACTORS: Arthritis, depression, bilateral anterior THAs, bilateral knee replacements 2015, right knee revision 2019 are also affecting patient's functional outcome.    REHAB POTENTIAL: Good   CLINICAL DECISION MAKING: Stable/uncomplicated   EVALUATION COMPLEXITY: Low     GOALS: Goals reviewed with patient? Yes   SHORT TERM GOALS: Target date: 05/11/2023 Emmaleigh will be independent with her day 1 HEP Baseline: Started 04/13/2023 Goal status: On Going 04/24/2023   2.  Improve bilateral quadriceps strength to at least 60 pounds Baseline: 51.0/54.6 pounds Goal status: INITIAL     LONG TERM GOALS: Target date: 06/08/2023   Improve FOTO to 82 Baseline: 78, risk-adjusted 48 Goal status: INITIAL   2.  Improve bilateral quadriceps strength to 70+ pounds Baseline: 51.0/54.6 pounds Goal status: INITIAL   3.  Jany will be independent with her long-term maintenance  HEP at [place] Baseline: Started 04/13/2023 Goal status: INITIAL     PLAN:   PT FREQUENCY: 1x/week   PT DURATION: 8 weeks   PLANNED INTERVENTIONS: Therapeutic exercises, Therapeutic activity, Neuromuscular re-education, Balance training, Gait training, Patient/Family education, Stair training, Cryotherapy, and Vasopneumatic device   PLAN FOR NEXT SESSION:  Be conservative with adding to HEP due to compliance.  OK to progress some basic gym activities (bike, leg press, 90-40 knee extension machine).  Cherlyn Cushing PT, MPT

## 2023-04-27 ENCOUNTER — Ambulatory Visit (INDEPENDENT_AMBULATORY_CARE_PROVIDER_SITE_OTHER): Payer: Medicare Other | Admitting: Rehabilitative and Restorative Service Providers"

## 2023-04-27 ENCOUNTER — Encounter: Payer: Self-pay | Admitting: Rehabilitative and Restorative Service Providers"

## 2023-04-27 DIAGNOSIS — R600 Localized edema: Secondary | ICD-10-CM | POA: Diagnosis not present

## 2023-04-27 DIAGNOSIS — M6281 Muscle weakness (generalized): Secondary | ICD-10-CM | POA: Diagnosis not present

## 2023-04-27 NOTE — Therapy (Signed)
OUTPATIENT PHYSICAL THERAPY LOWER EXTREMITY TREATMENT     Patient Name: Becky Cunningham MRN: [id number] DOB:1950/12/28, 72 y.o., female Today's Date: 04/13/2023   END OF SESSION:   PT End of Session - 04/13/23 1429       Visit Number 3    Number of Visits 8     Date for PT Re-Evaluation 06/08/23     Authorization Type Medicare     Authorization - Visit Number 3    Authorization - Number of Visits 8     Progress Note Due on Visit 8     PT Start Time 1345     PT Stop Time 1425     PT Time Calculation (min) 40 min     Activity Tolerance Patient tolerated treatment well;No increased pain     Behavior During Therapy WFL for tasks assessed/performed                       Past Medical History:  Diagnosis Date   Arthritis      "qwhere; dx'd in 1980" (05/06/2018)   Depression     Effusion of knee      Recurrent- Right   History of blood transfusion 09/2017    "related to hip OR"             Past Surgical History:  Procedure Laterality Date   BILATERAL ANTERIOR TOTAL HIP ARTHROPLASTY Bilateral 09/18/2017    Procedure: BILATERAL ANTERIOR TOTAL HIP ARTHROPLASTY;  Surgeon: Kathryne Hitch, MD;  Location: [facility];  Service: Orthopedics;  Laterality: Bilateral;   DILATION AND CURETTAGE OF UTERUS       JOINT REPLACEMENT       KNEE ARTHROSCOPY Left 2009   SYNOVECTOMY WITH POLY EXCHANGE Right 05/06/2018    knee   TMJ similar surgery        TOTAL KNEE ARTHROPLASTY Bilateral 08/25/2014    Procedure: TOTAL KNEE BILATERAL;  Surgeon: Kathryne Hitch, MD;  Location: [facility];  Service: Orthopedics;  Laterality: Bilateral;   TOTAL KNEE REVISION Right 05/06/2018    Procedure: Right knee polyexchange with synovectomy;  Surgeon: Kathryne Hitch, MD;  Location: [facility] OR;  Service: Orthopedics;  Laterality: Right;   WISDOM TOOTH EXTRACTION                Patient Active Problem List    Diagnosis Date Noted   Pain in left ankle and joints of left foot 11/17/2019    Polyethylene wear of right knee prosthesis, initial encounter (HCC) 05/06/2018   Status post revision of total knee, right 05/06/2018   Bilateral primary osteoarthritis of hip 09/18/2017   History of bilateral hip arthroplasty 09/18/2017   Status post bilateral hip replacements 09/18/2017   Unilateral primary osteoarthritis, left hip 07/07/2017   Unilateral primary osteoarthritis, right hip 07/07/2017   Pain in left wrist 03/30/2017   Primary osteoarthritis of both knees 08/25/2014   Status post bilateral knee replacements 08/25/2014      PCP: Shirlean Mylar, MD   REFERRING PROVIDER: Kirtland Bouchard, PA-C   REFERRING DIAG: [phone] (ICD-10-CM) - Status post total right knee replacement   THERAPY DIAG:  Muscle weakness (generalized) - Plan: PT plan of care cert/re-cert   Localized edema - Plan: PT plan of care cert/re-cert   Rationale for Evaluation and Treatment: Rehabilitation   ONSET DATE: Chronic   SUBJECTIVE:    SUBJECTIVE STATEMENT: Becky Cunningham reports continued "every other day" HEP compliance.  No fluid has been drained  from her right knee since evaluation.  She is happy with her PT thus far.   PERTINENT HISTORY: Arthritis, depression, bilateral anterior THAs, bilateral knee replacements 2015, right knee revision 2019  PAIN:  Are you having pain? No   PRECAUTIONS: None   RED FLAGS: None      WEIGHT BEARING RESTRICTIONS: No   FALLS:  Has patient fallen in last 6 months? 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 Uncomplicated right total knee prosthesis. 2.  Small knee joint effusion.   PATIENT SURVEYS:  FOTO 78 (risk-adjusted 48, Goal 82 in 11 visits)   COGNITION: Overall cognitive status: Within functional limits for tasks assessed                         SENSATION: Becky Cunningham notes intermittent right forearm burning and bilateral middle finger numbness.  No lower extremity paresthesias were noted .   EDEMA:  Becky Cunningham notes she has had her right knee drained several times including recently by Rexene Edison, PA-C.       LOWER EXTREMITY ROM:   Active ROM Left/Right 04/13/2023    Hip flexion      Hip extension      Hip abduction      Hip adduction      Hip internal rotation      Hip external rotation      Knee flexion 133/132    Knee extension 0/0    Ankle dorsiflexion      Ankle plantarflexion      Ankle inversion      Ankle eversion       (Blank rows = not tested)   LOWER EXTREMITY STRENGTH:   In pounds assessed with hand-held dynamometer Left/Right 04/13/2023  Left/Right 04/27/2023  Hip flexion      Hip extension      Hip abduction      Hip adduction      Hip internal rotation      Hip external rotation      Knee flexion      Knee extension 51.0/54.6  53.3/55.0  Ankle dorsiflexion      Ankle plantarflexion      Ankle inversion      Ankle eversion       (Blank rows = not tested)     GAIT: Distance walked: 150 feet Assistive device utilized: None Level of assistance: Complete Independence Comments: NA     TODAY'S TREATMENT:  DATE:  04/27/2023 Recumbent bike Seat 6 for 8 minutes Level 5 Seated straight leg raises 4 sets of 5 with 1.5#  Functional Activities: Double Leg Press 81# 10 x slow eccentrics Single Leg Press 43# 10 x slow eccentrics bilateral  Neuromuscular re-education: Tandem balance: eyes open; head turning and eyes closed 3 x each 20 seconds   04/24/2023 Recumbent bike Seat 6 for 5  minutes Level 3 Seated straight leg raises 4 sets of 5 with 1# Quadriceps sets 2 sets of 10 for 5 seconds  Functional Activities: Double Leg Press 75# 10 x slow eccentrics Single Leg Press 37# 10 x slow eccentrics bilateral  Neuromuscular re-education: Tandem balance: eyes open; head turning and eyes closed 3 x each 20 seconds   04/13/2023 Quadriceps sets 10 x 5 seconds bilateral Seated straight leg raises 2 sets of 5 for 3 seconds   Reviewed home exercise program and prognosis    PATIENT EDUCATION:  Education details: See above Person educated: Patient Education method: Explanation, Demonstration, Tactile cues, Verbal cues, and Handouts Education comprehension: verbalized understanding, returned demonstration, verbal cues required, tactile cues required, and needs further education   HOME EXERCISE PROGRAM: [id number]   ASSESSMENT:   CLINICAL IMPRESSION: Quadriceps strength is showing objective progress as compared to evaluation 2 weeks ago.  We discussed adding stair challenges next week, particularly descending to improve eccentric quadriceps strength.  Continue Becky Cunningham's current HEP to meet long-term goals.    OBJECTIVE IMPAIRMENTS: decreased knowledge of condition, decreased strength, and increased edema.    ACTIVITY LIMITATIONS:  Limits ability to walk dog and complete all work activities when edema is present.   PARTICIPATION LIMITATIONS: community activity and occupation   PERSONAL FACTORS: Arthritis, depression, bilateral anterior THAs, bilateral knee replacements 2015, right knee revision 2019 are also affecting patient's functional outcome.    REHAB POTENTIAL: Good   CLINICAL DECISION MAKING: Stable/uncomplicated   EVALUATION COMPLEXITY: Low     GOALS: Goals reviewed with patient? Yes   SHORT TERM GOALS: Target date: 05/11/2023 Becky Cunningham will be independent with her day 1 HEP Baseline: Started 04/13/2023 Goal status: Met 04/27/2023   2.  Improve bilateral quadriceps  strength to at least 60 pounds Baseline: 51.0/54.6 pounds Goal status: On Going 04/27/2023     LONG TERM GOALS: Target date: 06/08/2023   Improve FOTO to 82 Baseline: 78, risk-adjusted 48 Goal status: INITIAL   2.  Improve bilateral quadriceps strength to 70+ pounds Baseline: 51.0/54.6 pounds Goal status: On Going 04/27/2023   3.  Becky Cunningham will be independent with her long-term maintenance HEP at [place] Baseline: Started 04/13/2023 Goal status: INITIAL     PLAN:   PT FREQUENCY: 1x/week   PT DURATION: 8 weeks   PLANNED INTERVENTIONS: Therapeutic exercises, Therapeutic activity, Neuromuscular re-education, Balance training, Gait training, Patient/Family education, Stair training, Cryotherapy, and Vasopneumatic device   PLAN FOR NEXT SESSION:  Be very selective with adding to HEP due to compliance.  OK to progress some basic gym activities (bike, leg press, 90-40 knee extension machine) and eccentric step work next visit.  Cherlyn Cushing PT, MPT

## 2023-05-07 ENCOUNTER — Encounter: Payer: Self-pay | Admitting: Rehabilitative and Restorative Service Providers"

## 2023-05-07 ENCOUNTER — Ambulatory Visit (INDEPENDENT_AMBULATORY_CARE_PROVIDER_SITE_OTHER): Payer: Medicare Other | Admitting: Rehabilitative and Restorative Service Providers"

## 2023-05-07 DIAGNOSIS — M6281 Muscle weakness (generalized): Secondary | ICD-10-CM

## 2023-05-07 DIAGNOSIS — R600 Localized edema: Secondary | ICD-10-CM | POA: Diagnosis not present

## 2023-05-07 NOTE — Therapy (Signed)
OUTPATIENT PHYSICAL THERAPY LOWER EXTREMITY TREATMENT     Patient Name: Becky Cunningham MRN: [id number] DOB:[birth year], 72 y.o., female Today's Date: 04/13/2023   END OF SESSION:   PT End of Session - 04/13/23 1429       Visit Number 3    Number of Visits 8     Date for PT Re-Evaluation 06/08/23     Authorization Type Medicare     Authorization - Visit Number 3    Authorization - Number of Visits 8     Progress Note Due on Visit 8     PT Start Time 1345     PT Stop Time 1425     PT Time Calculation (min) 40 min     Activity Tolerance Patient tolerated treatment well;No increased pain     Behavior During Therapy WFL for tasks assessed/performed                       Past Medical History:  Diagnosis Date   Arthritis      "qwhere; dx'd in 1980" (05/06/2018)   Depression     Effusion of knee      Recurrent- Right   History of blood transfusion 09/2017    "related to hip OR"             Past Surgical History:  Procedure Laterality Date   BILATERAL ANTERIOR TOTAL HIP ARTHROPLASTY Bilateral 09/18/2017    Procedure: BILATERAL ANTERIOR TOTAL HIP ARTHROPLASTY;  Surgeon: Kathryne Hitch, MD;  Location: [facility];  Service: Orthopedics;  Laterality: Bilateral;   DILATION AND CURETTAGE OF UTERUS       JOINT REPLACEMENT       KNEE ARTHROSCOPY Left 2009   SYNOVECTOMY WITH POLY EXCHANGE Right 05/06/2018    knee   TMJ similar surgery        TOTAL KNEE ARTHROPLASTY Bilateral 08/25/2014    Procedure: TOTAL KNEE BILATERAL;  Surgeon: Kathryne Hitch, MD;  Location: [facility];  Service: Orthopedics;  Laterality: Bilateral;   TOTAL KNEE REVISION Right 05/06/2018    Procedure: Right knee polyexchange with synovectomy;  Surgeon: Kathryne Hitch, MD;  Location: [facility] OR;  Service: Orthopedics;  Laterality: Right;   WISDOM TOOTH EXTRACTION                Patient Active Problem List    Diagnosis Date Noted   Pain in left ankle and joints of left foot 11/17/2019    Polyethylene wear of right knee prosthesis, initial encounter (HCC) 05/06/2018   Status post revision of total knee, right 05/06/2018   Bilateral primary osteoarthritis of hip 09/18/2017   History of bilateral hip arthroplasty 09/18/2017   Status post bilateral hip replacements 09/18/2017   Unilateral primary osteoarthritis, left hip 07/07/2017   Unilateral primary osteoarthritis, right hip 07/07/2017   Pain in left wrist 03/30/2017   Primary osteoarthritis of both knees 08/25/2014   Status post bilateral knee replacements 08/25/2014      PCP: Shirlean Mylar, MD   REFERRING PROVIDER: Kirtland Bouchard, PA-C   REFERRING DIAG: [phone] (ICD-10-CM) - Status post total right knee replacement   THERAPY DIAG:  Muscle weakness (generalized) - Plan: PT plan of care cert/re-cert   Localized edema - Plan: PT plan of care cert/re-cert   Rationale for Evaluation and Treatment: Rehabilitation   ONSET DATE: Chronic   SUBJECTIVE:    SUBJECTIVE STATEMENT: Becky Cunningham reports she continues to average "every other day" HEP compliance.  She feels  pretty good at the end of the day as far as her knees.  She has some left mid-foot arthritis she asked about help with.   PERTINENT HISTORY: Arthritis, depression, bilateral anterior THAs, bilateral knee replacements 2015, right knee revision 2019  PAIN:  Are you having pain? No   PRECAUTIONS: None   RED FLAGS: None      WEIGHT BEARING RESTRICTIONS: No   FALLS:  Has patient fallen in last 6 months? 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  Uncomplicated right total knee prosthesis. 2. Small knee joint effusion.   PATIENT SURVEYS:  FOTO 78 (risk-adjusted 48, Goal 82 in 11 visits)   COGNITION: Overall cognitive status: Within functional limits for tasks assessed                         SENSATION: Becky Cunningham notes intermittent right forearm burning and bilateral middle finger numbness.  No lower extremity paresthesias were noted .   EDEMA:  Becky Cunningham notes she has had her right knee drained several times including recently by Rexene Edison, PA-C.       LOWER EXTREMITY ROM:   Active ROM Left/Right 04/13/2023    Hip flexion      Hip extension      Hip abduction      Hip adduction      Hip internal rotation      Hip external rotation      Knee flexion 133/132    Knee extension 0/0    Ankle dorsiflexion      Ankle plantarflexion      Ankle inversion      Ankle eversion       (Blank rows = not tested)   LOWER EXTREMITY STRENGTH:   In pounds assessed with hand-held dynamometer Left/Right 04/13/2023  Left/Right 04/27/2023  Hip flexion      Hip extension      Hip abduction      Hip adduction      Hip internal rotation      Hip external rotation      Knee flexion      Knee extension 51.0/54.6  53.3/55.0  Ankle dorsiflexion      Ankle plantarflexion      Ankle inversion      Ankle eversion       (Blank rows = not tested)     GAIT: Distance walked: 150 feet Assistive device utilized: None Level of assistance: Complete Independence Comments: NA     TODAY'S TREATMENT:  DATE:  05/07/2023 Recumbent bike Seat 6 for 5 minutes Level 3-5 Seated straight leg raises 3 sets of 5 with 2# Heel cords box Upper for 1 minute Heel Raises 20 x for 3 seconds  Functional Activities: Double Leg Press 81# 10 x slow eccentrics Single Leg Press 43# 10 x slow eccentrics bilateral  Neuromuscular re-education: Tandem  balance: eyes open; head turning and eyes closed 1 x each 20 seconds   04/27/2023 Recumbent bike Seat 6 for 8 minutes Level 5 Seated straight leg raises 4 sets of 5 with 1.5#  Functional Activities: Double Leg Press 81# 10 x slow eccentrics Single Leg Press 43# 10 x slow eccentrics bilateral  Neuromuscular re-education: Tandem balance: eyes open; head turning and eyes closed 3 x each 20 seconds   04/24/2023 Recumbent bike Seat 6 for 5 minutes Level 3 Seated straight leg raises 4 sets of 5 with 1# Quadriceps sets 2 sets of 10 for 5 seconds  Functional Activities: Double Leg Press 75# 10 x slow eccentrics Single Leg Press 37# 10 x slow eccentrics bilateral  Neuromuscular re-education: Tandem balance: eyes open; head turning and eyes closed 3 x each 20 seconds    PATIENT EDUCATION:  Education details: See above Person educated: Patient Education method: Explanation, Demonstration, Tactile cues, Verbal cues, and Handouts Education comprehension: verbalized understanding, returned demonstration, verbal cues required, tactile cues required, and needs further education   HOME EXERCISE PROGRAM: [id number]   ASSESSMENT:   CLINICAL IMPRESSION: Quadriceps strength remains the focus of Anistyn's supervised PT and we were able to progress into some stair challenges.  We discussed possibly doing a progress note next visit to assess early progress and determine her comfort and appropriateness of her current home exercises.   OBJECTIVE IMPAIRMENTS: decreased knowledge of condition, decreased strength, and increased edema.    ACTIVITY LIMITATIONS:  Limits ability to walk dog and complete all work activities when edema is present.   PARTICIPATION LIMITATIONS: community activity and occupation   PERSONAL FACTORS: Arthritis, depression, bilateral anterior THAs, bilateral knee replacements 2015, right knee revision 2019 are also affecting patient's functional outcome.    REHAB POTENTIAL:  Good   CLINICAL DECISION MAKING: Stable/uncomplicated   EVALUATION COMPLEXITY: Low     GOALS: Goals reviewed with patient? Yes   SHORT TERM GOALS: Target date: 05/11/2023 [place] will be independent with her day 1 HEP Baseline: Started 04/13/2023 Goal status: Met 04/27/2023   2.  Improve bilateral quadriceps strength to at least 60 pounds Baseline: 51.0/54.6 pounds Goal status: On Going 05/07/2023     LONG TERM GOALS: Target date: 06/08/2023   Improve FOTO to 82 Baseline: 78, risk-adjusted 48 Goal status: INITIAL   2.  Improve bilateral quadriceps strength to 70+ pounds Baseline: 51.0/54.6 pounds Goal status: On Going 04/27/2023   3.  Nayvie will be independent with her long-term maintenance HEP at [place] Baseline: Started 04/13/2023 Goal status: INITIAL     PLAN:   PT FREQUENCY: 1x/week   PT DURATION: 8 weeks   PLANNED INTERVENTIONS: Therapeutic exercises, Therapeutic activity, Neuromuscular re-education, Balance training, Gait training, Patient/Family education, Stair training, Cryotherapy, and Vasopneumatic device   PLAN FOR NEXT SESSION:  Be very selective with adding to HEP due to compliance.  OK to progress some basic gym activities (bike, leg press, 90-40 knee extension machine) and eccentric step work.  FOTO and progress note?  Cherlyn Cushing PT, MPT

## 2023-05-13 ENCOUNTER — Encounter: Payer: Medicare Other | Admitting: Rehabilitative and Restorative Service Providers"

## 2023-05-13 DIAGNOSIS — K5730 Diverticulosis of large intestine without perforation or abscess without bleeding: Secondary | ICD-10-CM | POA: Diagnosis not present

## 2023-05-13 DIAGNOSIS — Z1211 Encounter for screening for malignant neoplasm of colon: Secondary | ICD-10-CM | POA: Diagnosis not present

## 2023-05-21 ENCOUNTER — Ambulatory Visit (INDEPENDENT_AMBULATORY_CARE_PROVIDER_SITE_OTHER): Payer: Medicare Other | Admitting: Rehabilitative and Restorative Service Providers"

## 2023-05-21 ENCOUNTER — Encounter: Payer: Self-pay | Admitting: Rehabilitative and Restorative Service Providers"

## 2023-05-21 DIAGNOSIS — M6281 Muscle weakness (generalized): Secondary | ICD-10-CM

## 2023-05-21 DIAGNOSIS — R600 Localized edema: Secondary | ICD-10-CM | POA: Diagnosis not present

## 2023-05-21 NOTE — Therapy (Signed)
OUTPATIENT PHYSICAL THERAPY LOWER EXTREMITY TREATMENT/DISCHARGE    PHYSICAL THERAPY DISCHARGE SUMMARY  Visits from Start of Care: 5  Current functional level related to goals / functional outcomes: See note   Remaining deficits: See note   Education / Equipment: Updated home exercise program  Patient agrees to discharge. Patient goals were met. Patient is being discharged due to being pleased with the current functional level.   Patient Name: LISETH RYON MRN: [id number] DOB:03-24-1951, 72 y.o., female Today's Date: 04/13/2023   END OF SESSION:   PT End of Session - 04/13/23 1429       Visit Number 5    Number of Visits 8     Date for PT Re-Evaluation 06/08/23     Authorization Type Medicare     Authorization - Visit Number 5    Authorization - Number of Visits 8     Progress Note Due on Visit 8     PT Start Time 1345     PT Stop Time 1430    PT Time Calculation (min) 45 min     Activity Tolerance Patient tolerated treatment well;No increased pain     Behavior During Therapy WFL for tasks assessed/performed                       Past Medical History:  Diagnosis Date   Arthritis      "qwhere; dx'd in 1980" (05/06/2018)   Depression     Effusion of knee      Recurrent- Right   History of blood transfusion 09/2017    "related to hip OR"             Past Surgical History:  Procedure Laterality Date   BILATERAL ANTERIOR TOTAL HIP ARTHROPLASTY Bilateral 09/18/2017    Procedure: BILATERAL ANTERIOR TOTAL HIP ARTHROPLASTY;  Surgeon: Kathryne Hitch, MD;  Location: [facility];  Service: Orthopedics;  Laterality: Bilateral;   DILATION AND CURETTAGE OF UTERUS       JOINT REPLACEMENT       KNEE ARTHROSCOPY Left 2009   SYNOVECTOMY WITH POLY EXCHANGE Right 05/06/2018    knee   TMJ similar surgery        TOTAL KNEE ARTHROPLASTY Bilateral 08/25/2014    Procedure: TOTAL KNEE BILATERAL;  Surgeon: Kathryne Hitch, MD;  Location: [facility];  Service: Orthopedics;   Laterality: Bilateral;   TOTAL KNEE REVISION Right 05/06/2018    Procedure: Right knee polyexchange with synovectomy;  Surgeon: Kathryne Hitch, MD;  Location: [facility] OR;  Service: Orthopedics;  Laterality: Right;   WISDOM TOOTH EXTRACTION                Patient Active Problem List    Diagnosis Date Noted   Pain in left ankle and joints of left foot 11/17/2019   Polyethylene wear of right knee prosthesis, initial encounter (HCC) 05/06/2018   Status post revision of total knee, right 05/06/2018   Bilateral primary osteoarthritis of hip 09/18/2017   History of bilateral hip arthroplasty 09/18/2017   Status post bilateral hip replacements 09/18/2017   Unilateral primary osteoarthritis, left hip 07/07/2017   Unilateral primary osteoarthritis, right hip 07/07/2017   Pain in left wrist 03/30/2017   Primary osteoarthritis of both knees 08/25/2014   Status post bilateral knee replacements 08/25/2014      PCP: Shirlean Mylar, MD   REFERRING PROVIDER: Kirtland Bouchard, PA-C   REFERRING DIAG: [phone] (ICD-10-CM) - Status post total right knee replacement  THERAPY DIAG:  Muscle weakness (generalized) - Plan: PT plan of care cert/re-cert   Localized edema - Plan: PT plan of care cert/re-cert   Rationale for Evaluation and Treatment: Rehabilitation   ONSET DATE: Chronic   SUBJECTIVE:    SUBJECTIVE STATEMENT: Anselma reports she continues to average "every other day" HEP compliance.  She feels good at the end of the day as far as her knees.  She has some left mid-foot arthritis she asked about help with and she reports she feels like she is ready to transfer into more independent rehabilitation.   PERTINENT HISTORY: Arthritis, depression, bilateral anterior THAs, bilateral knee replacements 2015, right knee revision 2019  PAIN:  Are you having pain? No   PRECAUTIONS: None   RED FLAGS: None      WEIGHT BEARING RESTRICTIONS: No   FALLS:  Has patient fallen in last 6 months? 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 Uncomplicated right total knee prosthesis. 2. Small knee joint effusion.   PATIENT SURVEYS:  05/21/2023 FOTO 91 (Goal met)  Eval: FOTO 78 (risk-adjusted 48, Goal 82 in 11 visits)   COGNITION: Overall cognitive status: Within functional limits for tasks assessed                         SENSATION: Makynleigh notes intermittent right forearm burning and bilateral middle finger numbness.  No lower extremity paresthesias were noted .   EDEMA:  Ondria notes she has had her right knee drained several times including recently by Rexene Edison, PA-C.       LOWER EXTREMITY ROM:   Active ROM Left/Right 04/13/2023    Hip flexion      Hip extension      Hip abduction      Hip adduction      Hip internal rotation      Hip external rotation      Knee flexion 133/132    Knee extension 0/0    Ankle dorsiflexion      Ankle plantarflexion      Ankle inversion      Ankle eversion       (Blank rows = not tested)   LOWER EXTREMITY STRENGTH:   In pounds assessed with hand-held dynamometer Left/Right 04/13/2023  Left/Right 04/27/2023 Left/Right 05/21/2023  Hip flexion       Hip extension       Hip abduction       Hip adduction       Hip internal rotation       Hip external rotation       Knee flexion       Knee extension 51.0/54.6  53.3/55.0 Deferred  Ankle dorsiflexion       Ankle plantarflexion       Ankle inversion       Ankle eversion        (Blank rows = not tested)     GAIT: Distance walked: 150 feet Assistive device utilized: None Level of  assistance: Complete Independence Comments: NA  TODAY'S TREATMENT:                                                                                                                              DATE:  05/21/2023 Recumbent bike Seat 6 for 6 minutes Level 3-5 Seated straight leg raises 3 sets of 5 with 2# Heel cords box Upper for 1 minute Heel Raises 20 x for 3 seconds  Functional Activities: Double Leg Press 86# 10 x slow eccentrics Single Leg Press 43# 10 x slow eccentrics bilateral  Neuromuscular re-education: Tandem balance: eyes open; head turning and eyes closed 1 x each 20 seconds   05/07/2023 Recumbent bike Seat 6 for 5 minutes Level 3-5 -Seated straight leg raises 3 sets of 5 with 2# Heel cords box Upper for 1 minute Heel Raises 20 x for 3 seconds  Functional Activities: Double Leg Press 81# 10 x slow eccentrics Single Leg Press 43# 10 x slow eccentrics bilateral  Neuromuscular re-education: Tandem balance: eyes open; head turning and eyes closed 1 x each 20 seconds   04/27/2023 Recumbent bike Seat 6 for 8 minutes Level 5 Seated straight leg raises 4 sets of 5 with 1.5#  Functional Activities: Double Leg Press 81# 10 x slow eccentrics Single Leg Press 43# 10 x slow eccentrics bilateral  Neuromuscular re-education: Tandem balance: eyes open; head turning and eyes closed 3 x each 20 seconds   PATIENT EDUCATION:  Education details: See above Person educated: Patient Education method: Explanation, Demonstration, Tactile cues, Verbal cues, and Handouts Education comprehension: verbalized understanding, returned demonstration, verbal cues required, tactile cues required, and needs further education   HOME EXERCISE PROGRAM: [id number]   ASSESSMENT:   CLINICAL IMPRESSION: Kerline has been able to maintain every other day home exercise program compliance.  Her program is focused on maintaining and improving quadriceps strength for long-term knee health.  Anelisse feels  as if she will be able to continue this program long-term and would like to transfer into more independent rehabilitation.   OBJECTIVE IMPAIRMENTS: decreased knowledge of condition, decreased strength, and increased edema.    ACTIVITY LIMITATIONS:  Limits ability to walk dog and complete all work activities when edema is present.   PARTICIPATION LIMITATIONS: community activity and occupation   PERSONAL FACTORS: Arthritis, depression, bilateral anterior THAs, bilateral knee replacements 2015, right knee revision 2019 are also affecting patient's functional outcome.    REHAB POTENTIAL: Good   CLINICAL DECISION MAKING: Stable/uncomplicated   EVALUATION COMPLEXITY: Low     GOALS: Goals reviewed with patient? Yes   SHORT TERM GOALS: Target date: 05/11/2023 Latissa will be independent with her day 1 HEP Baseline: Started 04/13/2023 Goal status: Met 04/27/2023   2.  Improve bilateral quadriceps strength to at least 60 pounds Baseline: 51.0/54.6 pounds Goal status: Deferred 05/21/2023     LONG TERM GOALS: Target date: 06/08/2023   Improve FOTO to 82 Baseline: 78, risk-adjusted 48 Goal status: Met 05/21/2023   2.  Improve bilateral  quadriceps strength to 70+ pounds Baseline: 51.0/54.6 pounds Goal status: Deferred 05/21/2023   3.  Linzy will be independent with her long-term maintenance HEP at DC Baseline: Started 04/13/2023 Goal status: Met 05/21/2023     PLAN:   PT FREQUENCY: DC   PT DURATION: DC   PLANNED INTERVENTIONS: Therapeutic exercises, Therapeutic activity, Neuromuscular re-education, Balance training, Gait training, Patient/Family education, Stair training, Cryotherapy, and Vasopneumatic device   PLAN FOR NEXT SESSION:  DC  Cherlyn Cushing PT, MPT

## 2023-05-26 DIAGNOSIS — E559 Vitamin D deficiency, unspecified: Secondary | ICD-10-CM | POA: Diagnosis not present

## 2023-05-26 DIAGNOSIS — M62838 Other muscle spasm: Secondary | ICD-10-CM | POA: Diagnosis not present

## 2023-05-26 DIAGNOSIS — E785 Hyperlipidemia, unspecified: Secondary | ICD-10-CM | POA: Diagnosis not present

## 2023-05-26 DIAGNOSIS — R030 Elevated blood-pressure reading, without diagnosis of hypertension: Secondary | ICD-10-CM | POA: Diagnosis not present

## 2023-05-26 DIAGNOSIS — Z0000 Encounter for general adult medical examination without abnormal findings: Secondary | ICD-10-CM | POA: Diagnosis not present

## 2023-05-26 DIAGNOSIS — Z23 Encounter for immunization: Secondary | ICD-10-CM | POA: Diagnosis not present

## 2023-05-26 DIAGNOSIS — Z52008 Unspecified donor, other blood: Secondary | ICD-10-CM | POA: Diagnosis not present

## 2023-05-26 DIAGNOSIS — M1990 Unspecified osteoarthritis, unspecified site: Secondary | ICD-10-CM | POA: Diagnosis not present

## 2023-05-26 DIAGNOSIS — F3342 Major depressive disorder, recurrent, in full remission: Secondary | ICD-10-CM | POA: Diagnosis not present

## 2023-05-26 DIAGNOSIS — F900 Attention-deficit hyperactivity disorder, predominantly inattentive type: Secondary | ICD-10-CM | POA: Diagnosis not present

## 2023-05-27 DIAGNOSIS — Z23 Encounter for immunization: Secondary | ICD-10-CM | POA: Diagnosis not present

## 2023-11-25 ENCOUNTER — Other Ambulatory Visit (HOSPITAL_COMMUNITY): Payer: Self-pay | Admitting: Family Medicine

## 2023-11-25 DIAGNOSIS — E785 Hyperlipidemia, unspecified: Secondary | ICD-10-CM | POA: Diagnosis not present

## 2023-11-25 DIAGNOSIS — F900 Attention-deficit hyperactivity disorder, predominantly inattentive type: Secondary | ICD-10-CM | POA: Diagnosis not present

## 2023-11-25 DIAGNOSIS — R030 Elevated blood-pressure reading, without diagnosis of hypertension: Secondary | ICD-10-CM | POA: Diagnosis not present

## 2023-12-01 ENCOUNTER — Ambulatory Visit (HOSPITAL_COMMUNITY)
Admission: RE | Admit: 2023-12-01 | Discharge: 2023-12-01 | Disposition: A | Discharge status: Home / Self Care | Payer: Self-pay | Source: Ambulatory Visit | Attending: Family Medicine | Admitting: Family Medicine

## 2023-12-01 ENCOUNTER — Encounter (HOSPITAL_COMMUNITY): Payer: Self-pay

## 2023-12-01 DIAGNOSIS — E785 Hyperlipidemia, unspecified: Secondary | ICD-10-CM | POA: Insufficient documentation

## 2023-12-16 DIAGNOSIS — E785 Hyperlipidemia, unspecified: Secondary | ICD-10-CM | POA: Diagnosis not present

## 2023-12-16 DIAGNOSIS — F900 Attention-deficit hyperactivity disorder, predominantly inattentive type: Secondary | ICD-10-CM | POA: Diagnosis not present

## 2023-12-16 DIAGNOSIS — J9811 Atelectasis: Secondary | ICD-10-CM | POA: Diagnosis not present

## 2023-12-16 DIAGNOSIS — R931 Abnormal findings on diagnostic imaging of heart and coronary circulation: Secondary | ICD-10-CM | POA: Diagnosis not present

## 2023-12-16 DIAGNOSIS — I77810 Thoracic aortic ectasia: Secondary | ICD-10-CM | POA: Diagnosis not present

## 2024-01-06 DIAGNOSIS — H40013 Open angle with borderline findings, low risk, bilateral: Secondary | ICD-10-CM | POA: Diagnosis not present

## 2024-01-06 DIAGNOSIS — H52223 Regular astigmatism, bilateral: Secondary | ICD-10-CM | POA: Diagnosis not present

## 2024-01-06 DIAGNOSIS — H524 Presbyopia: Secondary | ICD-10-CM | POA: Diagnosis not present

## 2024-01-06 DIAGNOSIS — H5203 Hypermetropia, bilateral: Secondary | ICD-10-CM | POA: Diagnosis not present

## 2024-01-06 DIAGNOSIS — H2513 Age-related nuclear cataract, bilateral: Secondary | ICD-10-CM | POA: Diagnosis not present

## 2024-01-06 DIAGNOSIS — H35371 Puckering of macula, right eye: Secondary | ICD-10-CM | POA: Diagnosis not present

## 2024-02-17 DIAGNOSIS — R931 Abnormal findings on diagnostic imaging of heart and coronary circulation: Secondary | ICD-10-CM | POA: Diagnosis not present

## 2024-02-17 DIAGNOSIS — E785 Hyperlipidemia, unspecified: Secondary | ICD-10-CM | POA: Diagnosis not present

## 2024-02-17 DIAGNOSIS — J9811 Atelectasis: Secondary | ICD-10-CM | POA: Diagnosis not present

## 2024-05-07 NOTE — Progress Notes (Unsigned)
 Cardiology Office Note:    Date:  05/10/2024   ID:  [name], DOB [birth year], MRN [id number]  PCP:  Becky Becky RAMAN, Cunningham   [facility] Providers Cardiologist:  None     Referring MD: Becky Becky RAMAN, Cunningham   No chief complaint on file.   History of Present Illness:    Becky Cunningham is a 73 y.o. female is seen at the request of Becky Cunningham for evaluation of CAD and aortic enlargement. Coronary calcium score done this year was 76 and there was mild enlargement of the aorta noted at 4.0 cm. She has a history of HLD.   She lives a healthy lifestyle. Walks regularly and runs a business. Eats mostly a plant based diet. Was started on a statin in April. She has noted fluctuation of BP but has not been on antihypertensive therapy. She denies any chest pain, dyspnea, or palpitations.   Past Medical History:  Diagnosis Date   ADD (attention deficit disorder)    Arthritis    qwhere; dx'd in 1980 (05/06/2018)   Ascending aorta dilatation (HCC)    Atelectasis    Depression    Effusion of knee    Recurrent- Right   Elevated coronary artery calcium score    Estrogen deficiency    Generalized arthritis    History of blood transfusion 09/2017   related to hip OR   HLD (hyperlipidemia)    Osteoarthritis    Osteopenia    White coat syndrome with hypertension     Past Surgical History:  Procedure Laterality Date   BILATERAL ANTERIOR TOTAL HIP ARTHROPLASTY Bilateral 09/18/2017   Procedure: BILATERAL ANTERIOR TOTAL HIP ARTHROPLASTY;  Surgeon: Becky Becky GRADE, MD;  Location: [facility];  Service: Orthopedics;  Laterality: Bilateral;   DILATION AND CURETTAGE OF UTERUS     JOINT REPLACEMENT     KNEE ARTHROSCOPY Left 2009   SYNOVECTOMY WITH POLY EXCHANGE Right 05/06/2018   knee   TMJ similar surgery      TOTAL KNEE ARTHROPLASTY Bilateral 08/25/2014   Procedure: TOTAL KNEE BILATERAL;  Surgeon: Becky Cunningham Vernetta, MD;  Location: [facility];  Service: Orthopedics;   Laterality: Bilateral;   TOTAL KNEE REVISION Right 05/06/2018   Procedure: Right knee polyexchange with synovectomy;  Surgeon: Becky Becky GRADE, MD;  Location: [facility] OR;  Service: Orthopedics;  Laterality: Right;   WISDOM TOOTH EXTRACTION      Current Medications: Current Meds  Medication Sig   ascorbic acid  (VITAMIN C ) 500 MG tablet Take 500 mg by mouth 2 (two) times daily.   atorvastatin (LIPITOR) 20 MG tablet Take 20 mg by mouth daily.   calcium carbonate (SUPER CALCIUM) 1500 (600 Ca) MG TABS tablet Take 1,500 mg by mouth in the morning and at bedtime.   cyclobenzaprine (FLEXERIL) 10 MG tablet Take 10 mg by mouth 3 (three) times daily as needed for muscle spasms.   DIGESTIVE ENZYMES PO Take 1 capsule by mouth daily. Probiotic Digestive Enzyme.   etodolac (LODINE) 500 MG tablet Take 500 mg by mouth daily.   ferrous sulfate  325 (65 FE) MG tablet Take 1 tablet (325 mg total) by mouth 3 (three) times daily with meals. (Patient taking differently: Take 325 mg by mouth daily.)   Magnesium  500 MG TABS Take 500 mg by mouth daily.   methylphenidate (RITALIN) 20 MG tablet TK 1 T PO BID OES PRN (Patient taking differently: Take 20 mg by mouth daily as needed.)   Methylsulfonylmethane (MSM) 1000 MG  TABS Take 1,000 mg by mouth daily.   Probiotic Product (PROBIOTIC PO) Take 1 scoop by mouth daily. Probiotic Shake--Natural Vitality   Turmeric 500 MG CAPS Take by mouth.   venlafaxine XR (EFFEXOR-XR) 150 MG 24 hr capsule Take 150 mg by mouth daily.     Allergies:   Hydrocodone -acetaminophen , Sulfamethoxazole-trimethoprim, and Other   Social History   Socioeconomic History   Marital status: Married    Spouse name: Not on file   Number of children: 1   Years of education: Not on file   Highest education level: Not on file  Occupational History   Not on file  Tobacco Use   Smoking status: Never   Smokeless tobacco: Never  Vaping Use   Vaping status: Never Used  Substance and Sexual  Activity   Alcohol use: Yes    Alcohol/week: 12.0 standard drinks of alcohol    Types: 12 Glasses of wine per week    Comment: 05/06/2018 2 bottles/week; at least   Drug use: Yes    Types: Marijuana    Comment: 05/06/2018 weekends   Sexual activity: Yes  Other Topics Concern   Not on file  Social History Narrative   Women's Materials engineer.    Social Drivers of Corporate investment banker Strain: Not on file  Food Insecurity: Not on file  Transportation Needs: Not on file  Physical Activity: Not on file  Stress: Not on file  Social Connections: Not on file     Family History: The patient's family history includes Dementia in her mother; Diabetes in her brother; Leukemia in her father.  ROS:   Please see the history of present illness.     All other systems reviewed and are negative.  EKGs/Labs/Other Studies Reviewed:    The following studies were reviewed today: Coronary calcium score 12/01/23: Coronary Calcium Score   TECHNIQUE: A gated, non-contrast computed tomography scan of the heart was performed using 3mm slice thickness. Axial images were analyzed on a dedicated workstation. Calcium scoring of the coronary arteries was performed using the Agatston method.   FINDINGS: Coronary arteries: Normal origins.   Coronary Calcium Score:   Left main: 73   Left anterior descending artery: 3   Left circumflex artery: 0   Right coronary artery: 0   Total: 76   Percentile: 58   Pericardium: Normal.   Aorta: Mildly dilated caliber of ascending aorta (40 mm). Aortic atherosclerosis noted.   Non-cardiac: See separate report from [facility].   IMPRESSION: Coronary calcium score of 76. This was 58th percentile for age-, race-, and sex-matched controls. Mildly dilated caliber of ascending aorta (40 mm). Aortic atherosclerosis noted. EKG Interpretation Date/Time:  Tuesday May 10 2024 16:07:47 EDT Ventricular Rate:  68 PR Interval:  154 QRS  Duration:  80 QT Interval:  416 QTC Calculation: 442 R Axis:   9  Text Interpretation: Normal sinus rhythm Normal ECG No previous ECGs available Confirmed by Becky Cunningham [phone]) on 05/10/2024 4:30:50 PM   EKG Interpretation Date/Time:  Tuesday May 10 2024 16:07:47 EDT Ventricular Rate:  68 PR Interval:  154 QRS Duration:  80 QT Interval:  416 QTC Calculation: 442 R Axis:   9  Text Interpretation: Normal sinus rhythm Normal ECG No previous ECGs available Confirmed by [place], Broderick Fonseca [phone]) on 05/10/2024 4:30:50 PM   Recent Labs: No results found for requested labs within last 365 days.  Recent Lipid Panel No results found for: CHOL, TRIG, HDL, CHOLHDL, VLDL, LDLCALC, LDLDIRECT Dated 11/25/23:  cholesterol 202, triglycerides 102, HDL 53, LDL 131. CMET is normal.   Risk Assessment/Calculations:       Physical Exam:    VS:  BP (!) 142/84 (BP Location: Left Arm, Patient Position: Sitting, Cuff Size: Normal)   Pulse 68   Ht 5' 6.5 (1.689 m)   Wt 125 lb 9.6 oz (57 kg)   SpO2 98%   BMI 19.97 kg/m     Wt Readings from Last 3 Encounters:  05/10/24 125 lb 9.6 oz (57 kg)  03/30/23 128 lb (58.1 kg)  03/25/21 125 lb (56.7 kg)     GEN:  Well nourished, well developed in no acute distress HEENT: Normal NECK: No JVD; No carotid bruits LYMPHATICS: No lymphadenopathy CARDIAC: RRR, no murmurs, rubs, gallops RESPIRATORY:  Clear to auscultation without rales, wheezing or rhonchi  ABDOMEN: Soft, non-tender, non-distended MUSCULOSKELETAL:  No edema; No deformity  SKIN: Warm and dry NEUROLOGIC:  Alert and oriented x 3 PSYCHIATRIC:  Normal affect   ASSESSMENT:    1. Elevated coronary artery calcium score   2. Hyperlipidemia, unspecified hyperlipidemia type   3. Elevated BP without diagnosis of hypertension    PLAN:    In order of problems listed above:  Coronary artery calcification. No symptoms. Normal Ecg. Recommend risk factor modification. She already  follows a healthy lifestyle.  HLD. Goal LDL at least < 70. Now on statin. Will follow up fasting labs Elevated BP without diagnosis of HTN. Recommend she keep a BP diary and send readings to me in one month. If consistently > 130/80 would consider ARB Thoracic aortic enlargement 4.0. will repeat CT with contrast in one year.            Medication Adjustments/Labs and Tests Ordered: Current medicines are reviewed at length with the patient today.  Concerns regarding medicines are outlined above.  Orders Placed This Encounter  Procedures   Basic metabolic panel with GFR   Lipid panel   Hepatic function panel   EKG 12-Lead   No orders of the defined types were placed in this encounter.   There are no Patient Instructions on file for this visit.   Signed, Jarian Longoria [place], MD  05/10/2024 4:33 PM    [facility]

## 2024-05-10 ENCOUNTER — Encounter: Payer: Self-pay | Admitting: Cardiology

## 2024-05-10 ENCOUNTER — Ambulatory Visit: Attending: Cardiology | Admitting: Cardiology

## 2024-05-10 VITALS — BP 142/84 | HR 68 | Ht 66.5 in | Wt 125.6 lb

## 2024-05-10 DIAGNOSIS — Z01812 Encounter for preprocedural laboratory examination: Secondary | ICD-10-CM | POA: Insufficient documentation

## 2024-05-10 DIAGNOSIS — E785 Hyperlipidemia, unspecified: Secondary | ICD-10-CM | POA: Insufficient documentation

## 2024-05-10 DIAGNOSIS — R931 Abnormal findings on diagnostic imaging of heart and coronary circulation: Secondary | ICD-10-CM | POA: Diagnosis not present

## 2024-05-10 DIAGNOSIS — R030 Elevated blood-pressure reading, without diagnosis of hypertension: Secondary | ICD-10-CM | POA: Diagnosis not present

## 2024-05-10 DIAGNOSIS — I7789 Other specified disorders of arteries and arterioles: Secondary | ICD-10-CM | POA: Diagnosis not present

## 2024-05-10 NOTE — Patient Instructions (Signed)
 Medication Instructions:  Continue all medications  *If you need a refill on your cardiac medications before your next appointment, please call your pharmacy*  Lab Work: Fasting bmet,lipid and hepatic panels tomorrow 9/3  Bmet to be done 30 days before chest ct 05/2025 Have done in 04/2025  Testing/Procedures: Chest Ct to be scheduled in 1 year  05/2025  Follow-Up: At [facility], you and your health needs are our priority.  As part of our continuing mission to provide you with exceptional heart care, our providers are all part of one team.  This team includes your primary Cardiologist (physician) and Advanced Practice Providers or APPs (Physician Assistants and Nurse Practitioners) who all work together to provide you with the care you need, when you need it.  Your next appointment:  1 year   Call in June to schedule Sept appointment     Provider:  Dr.Jordan    Monitor blood pressure daily send readings through mychart or call    We recommend signing up for the patient portal called MyChart.  Sign up information is provided on this After Visit Summary.  MyChart is used to connect with patients for Virtual Visits (Telemedicine).  Patients are able to view lab/test results, encounter notes, upcoming appointments, etc.  Non-urgent messages can be sent to your provider as well.   To learn more about what you can do with MyChart, go to [email or web address].

## 2024-05-16 DIAGNOSIS — E785 Hyperlipidemia, unspecified: Secondary | ICD-10-CM | POA: Diagnosis not present

## 2024-05-16 DIAGNOSIS — R931 Abnormal findings on diagnostic imaging of heart and coronary circulation: Secondary | ICD-10-CM | POA: Diagnosis not present

## 2024-05-16 LAB — BASIC METABOLIC PANEL WITH GFR
BUN/Creatinine Ratio: 12 (ref 12–28)
BUN: 9 mg/dL (ref 8–27)
CO2: 23 mmol/L (ref 20–29)
Calcium: 9.1 mg/dL (ref 8.7–10.3)
Chloride: 101 mmol/L (ref 96–106)
Creatinine, Ser: 0.74 mg/dL (ref 0.57–1.00)
Glucose: 99 mg/dL (ref 70–99)
Potassium: 4.2 mmol/L (ref 3.5–5.2)
Sodium: 138 mmol/L (ref 134–144)
eGFR: 85 mL/min/1.73 (ref >59)

## 2024-05-16 LAB — HEPATIC FUNCTION PANEL
ALT: 18 IU/L (ref 0–32)
AST: 23 IU/L (ref 0–40)
Albumin: 4.6 g/dL (ref 3.8–4.8)
Alkaline Phosphatase: 75 IU/L (ref 44–121)
Bilirubin Total: 0.6 mg/dL (ref 0.0–1.2)
Bilirubin, Direct: 0.24 mg/dL (ref 0.00–0.40)
Total Protein: 6.6 g/dL (ref 6.0–8.5)

## 2024-05-16 LAB — LIPID PANEL
Chol/HDL Ratio: 2.4 ratio (ref 0.0–4.4)
Cholesterol, Total: 121 mg/dL (ref 100–199)
HDL: 50 mg/dL (ref >39)
LDL Chol Calc (NIH): 59 mg/dL (ref 0–99)
Triglycerides: 55 mg/dL (ref 0–149)
VLDL Cholesterol Cal: 12 mg/dL (ref 5–40)

## 2024-05-17 ENCOUNTER — Ambulatory Visit: Payer: Self-pay | Admitting: Cardiology

## 2024-06-03 DIAGNOSIS — E559 Vitamin D deficiency, unspecified: Secondary | ICD-10-CM | POA: Diagnosis not present

## 2024-06-03 DIAGNOSIS — R931 Abnormal findings on diagnostic imaging of heart and coronary circulation: Secondary | ICD-10-CM | POA: Diagnosis not present

## 2024-06-03 DIAGNOSIS — Z0000 Encounter for general adult medical examination without abnormal findings: Secondary | ICD-10-CM | POA: Diagnosis not present

## 2024-06-03 DIAGNOSIS — F3342 Major depressive disorder, recurrent, in full remission: Secondary | ICD-10-CM | POA: Diagnosis not present

## 2024-06-03 DIAGNOSIS — Z23 Encounter for immunization: Secondary | ICD-10-CM | POA: Diagnosis not present

## 2024-06-03 DIAGNOSIS — R030 Elevated blood-pressure reading, without diagnosis of hypertension: Secondary | ICD-10-CM | POA: Diagnosis not present

## 2024-06-03 DIAGNOSIS — Z52008 Unspecified donor, other blood: Secondary | ICD-10-CM | POA: Diagnosis not present

## 2024-06-03 DIAGNOSIS — E785 Hyperlipidemia, unspecified: Secondary | ICD-10-CM | POA: Diagnosis not present

## 2024-06-03 DIAGNOSIS — M1990 Unspecified osteoarthritis, unspecified site: Secondary | ICD-10-CM | POA: Diagnosis not present

## 2024-06-03 DIAGNOSIS — F900 Attention-deficit hyperactivity disorder, predominantly inattentive type: Secondary | ICD-10-CM | POA: Diagnosis not present

## 2024-06-03 DIAGNOSIS — J9811 Atelectasis: Secondary | ICD-10-CM | POA: Diagnosis not present

## 2024-06-03 DIAGNOSIS — M62838 Other muscle spasm: Secondary | ICD-10-CM | POA: Diagnosis not present

## 2025-05-10 ENCOUNTER — Ambulatory Visit (HOSPITAL_COMMUNITY)
